# Patient Record
Sex: Male | Born: 1975 | Race: Black or African American | Hispanic: No | Smoking: Current every day smoker
Health system: Southern US, Community
[De-identification: ages and names within clinical notes are randomized; demographics above are authoritative.]

## PROBLEM LIST (undated history)

## (undated) DIAGNOSIS — G629 Polyneuropathy, unspecified: Secondary | ICD-10-CM

## (undated) DIAGNOSIS — D571 Sickle-cell disease without crisis: Secondary | ICD-10-CM

## (undated) DIAGNOSIS — K5090 Crohn's disease, unspecified, without complications: Secondary | ICD-10-CM

## (undated) DIAGNOSIS — F4310 Post-traumatic stress disorder, unspecified: Secondary | ICD-10-CM

## (undated) HISTORY — PX: FOOT SURGERY: SHX648

## (undated) HISTORY — PX: CHOLECYSTECTOMY: SHX55

## (undated) HISTORY — PX: ORCHIECTOMY: SHX2116

---

## 2003-06-08 ENCOUNTER — Emergency Department (HOSPITAL_COMMUNITY): Admission: EM | Admit: 2003-06-08 | Discharge: 2003-06-09 | Payer: Self-pay | Admitting: Emergency Medicine

## 2003-07-18 ENCOUNTER — Emergency Department (HOSPITAL_COMMUNITY): Admission: EM | Admit: 2003-07-18 | Discharge: 2003-07-18 | Payer: Self-pay | Admitting: Emergency Medicine

## 2003-10-21 ENCOUNTER — Encounter: Payer: Self-pay | Admitting: Emergency Medicine

## 2003-10-22 ENCOUNTER — Inpatient Hospital Stay (HOSPITAL_COMMUNITY): Admission: EM | Admit: 2003-10-22 | Discharge: 2003-10-23 | Payer: Self-pay | Admitting: Psychiatry

## 2004-08-11 ENCOUNTER — Observation Stay (HOSPITAL_COMMUNITY): Admission: EM | Admit: 2004-08-11 | Discharge: 2004-08-11 | Payer: Self-pay | Admitting: Emergency Medicine

## 2005-03-12 ENCOUNTER — Other Ambulatory Visit: Payer: Self-pay

## 2005-03-12 ENCOUNTER — Emergency Department: Payer: Self-pay | Admitting: Emergency Medicine

## 2006-01-11 ENCOUNTER — Emergency Department: Payer: Self-pay | Admitting: Emergency Medicine

## 2006-02-06 ENCOUNTER — Emergency Department: Payer: Self-pay | Admitting: Unknown Physician Specialty

## 2006-02-08 ENCOUNTER — Emergency Department: Payer: Self-pay | Admitting: Emergency Medicine

## 2006-02-09 ENCOUNTER — Emergency Department: Payer: Self-pay | Admitting: Emergency Medicine

## 2006-02-10 ENCOUNTER — Other Ambulatory Visit: Payer: Self-pay

## 2006-02-26 ENCOUNTER — Emergency Department (HOSPITAL_COMMUNITY): Admission: EM | Admit: 2006-02-26 | Discharge: 2006-02-26 | Payer: Self-pay | Admitting: Emergency Medicine

## 2006-04-22 ENCOUNTER — Emergency Department (HOSPITAL_COMMUNITY): Admission: EM | Admit: 2006-04-22 | Discharge: 2006-04-23 | Payer: Self-pay | Admitting: Emergency Medicine

## 2006-05-01 ENCOUNTER — Emergency Department (HOSPITAL_COMMUNITY): Admission: EM | Admit: 2006-05-01 | Discharge: 2006-05-02 | Payer: Self-pay | Admitting: Emergency Medicine

## 2006-05-04 ENCOUNTER — Emergency Department (HOSPITAL_COMMUNITY): Admission: EM | Admit: 2006-05-04 | Discharge: 2006-05-05 | Payer: Self-pay | Admitting: Emergency Medicine

## 2006-05-06 ENCOUNTER — Emergency Department (HOSPITAL_COMMUNITY): Admission: EM | Admit: 2006-05-06 | Discharge: 2006-05-07 | Payer: Self-pay | Admitting: Emergency Medicine

## 2006-05-25 ENCOUNTER — Emergency Department (HOSPITAL_COMMUNITY): Admission: EM | Admit: 2006-05-25 | Discharge: 2006-05-25 | Payer: Self-pay | Admitting: Emergency Medicine

## 2006-06-16 ENCOUNTER — Emergency Department (HOSPITAL_COMMUNITY): Admission: EM | Admit: 2006-06-16 | Discharge: 2006-06-16 | Payer: Self-pay | Admitting: Emergency Medicine

## 2006-07-05 ENCOUNTER — Emergency Department (HOSPITAL_COMMUNITY): Admission: EM | Admit: 2006-07-05 | Discharge: 2006-07-05 | Payer: Self-pay | Admitting: Emergency Medicine

## 2006-07-10 ENCOUNTER — Emergency Department (HOSPITAL_COMMUNITY): Admission: EM | Admit: 2006-07-10 | Discharge: 2006-07-11 | Payer: Self-pay | Admitting: Emergency Medicine

## 2006-10-20 ENCOUNTER — Emergency Department: Payer: Self-pay | Admitting: Emergency Medicine

## 2006-10-26 ENCOUNTER — Emergency Department: Payer: Self-pay | Admitting: Emergency Medicine

## 2006-10-26 ENCOUNTER — Other Ambulatory Visit: Payer: Self-pay

## 2007-01-11 ENCOUNTER — Emergency Department (HOSPITAL_COMMUNITY): Admission: EM | Admit: 2007-01-11 | Discharge: 2007-01-12 | Payer: Self-pay | Admitting: Emergency Medicine

## 2007-03-12 ENCOUNTER — Emergency Department (HOSPITAL_COMMUNITY): Admission: EM | Admit: 2007-03-12 | Discharge: 2007-03-12 | Payer: Self-pay | Admitting: Emergency Medicine

## 2007-07-06 ENCOUNTER — Emergency Department: Payer: Self-pay | Admitting: Emergency Medicine

## 2007-08-15 ENCOUNTER — Emergency Department: Payer: Self-pay | Admitting: Emergency Medicine

## 2007-08-15 ENCOUNTER — Other Ambulatory Visit: Payer: Self-pay

## 2007-09-01 ENCOUNTER — Emergency Department: Payer: Self-pay | Admitting: Emergency Medicine

## 2007-12-18 ENCOUNTER — Inpatient Hospital Stay (HOSPITAL_COMMUNITY): Admission: RE | Admit: 2007-12-18 | Discharge: 2007-12-22 | Payer: Self-pay | Admitting: Psychiatry

## 2007-12-18 ENCOUNTER — Emergency Department (HOSPITAL_COMMUNITY): Admission: EM | Admit: 2007-12-18 | Discharge: 2007-12-18 | Payer: Self-pay | Admitting: Emergency Medicine

## 2007-12-18 ENCOUNTER — Ambulatory Visit: Payer: Self-pay | Admitting: Psychiatry

## 2007-12-23 ENCOUNTER — Inpatient Hospital Stay (HOSPITAL_COMMUNITY): Admission: RE | Admit: 2007-12-23 | Discharge: 2007-12-26 | Payer: Self-pay | Admitting: Psychiatry

## 2008-01-06 ENCOUNTER — Emergency Department (HOSPITAL_COMMUNITY): Admission: EM | Admit: 2008-01-06 | Discharge: 2008-01-07 | Payer: Self-pay | Admitting: Emergency Medicine

## 2008-01-07 ENCOUNTER — Ambulatory Visit: Payer: Self-pay | Admitting: Psychiatry

## 2008-01-07 ENCOUNTER — Inpatient Hospital Stay (HOSPITAL_COMMUNITY): Admission: EM | Admit: 2008-01-07 | Discharge: 2008-01-14 | Payer: Self-pay | Admitting: Psychiatry

## 2008-01-08 ENCOUNTER — Emergency Department (HOSPITAL_COMMUNITY): Admission: EM | Admit: 2008-01-08 | Discharge: 2008-01-09 | Payer: Self-pay | Admitting: Emergency Medicine

## 2008-01-24 ENCOUNTER — Inpatient Hospital Stay: Payer: Self-pay | Admitting: Unknown Physician Specialty

## 2008-01-24 ENCOUNTER — Other Ambulatory Visit: Payer: Self-pay

## 2009-02-08 ENCOUNTER — Emergency Department: Payer: Self-pay | Admitting: Emergency Medicine

## 2009-02-09 ENCOUNTER — Emergency Department (HOSPITAL_COMMUNITY): Admission: EM | Admit: 2009-02-09 | Discharge: 2009-02-09 | Payer: Self-pay | Admitting: Emergency Medicine

## 2009-02-11 ENCOUNTER — Emergency Department: Payer: Self-pay | Admitting: Internal Medicine

## 2009-03-05 ENCOUNTER — Emergency Department: Payer: Self-pay | Admitting: Unknown Physician Specialty

## 2009-03-20 ENCOUNTER — Emergency Department: Payer: Self-pay | Admitting: Emergency Medicine

## 2009-03-31 ENCOUNTER — Emergency Department: Payer: Self-pay | Admitting: Emergency Medicine

## 2009-04-01 ENCOUNTER — Emergency Department: Payer: Self-pay | Admitting: Emergency Medicine

## 2009-04-07 ENCOUNTER — Emergency Department: Payer: Self-pay | Admitting: Emergency Medicine

## 2009-04-10 ENCOUNTER — Emergency Department: Payer: Self-pay | Admitting: Emergency Medicine

## 2009-04-14 ENCOUNTER — Emergency Department (HOSPITAL_COMMUNITY): Admission: EM | Admit: 2009-04-14 | Discharge: 2009-04-15 | Payer: Self-pay | Admitting: Emergency Medicine

## 2009-04-28 ENCOUNTER — Emergency Department (HOSPITAL_COMMUNITY): Admission: EM | Admit: 2009-04-28 | Discharge: 2009-04-29 | Payer: Self-pay | Admitting: Emergency Medicine

## 2009-05-05 ENCOUNTER — Emergency Department (HOSPITAL_COMMUNITY): Admission: EM | Admit: 2009-05-05 | Discharge: 2009-05-06 | Payer: Self-pay | Admitting: Emergency Medicine

## 2009-05-08 ENCOUNTER — Emergency Department: Payer: Self-pay | Admitting: Emergency Medicine

## 2009-05-11 ENCOUNTER — Emergency Department (HOSPITAL_COMMUNITY): Admission: EM | Admit: 2009-05-11 | Discharge: 2009-05-11 | Payer: Self-pay | Admitting: Emergency Medicine

## 2009-05-14 ENCOUNTER — Emergency Department: Payer: Self-pay | Admitting: Emergency Medicine

## 2009-05-15 ENCOUNTER — Inpatient Hospital Stay: Payer: Self-pay | Admitting: Internal Medicine

## 2009-05-24 ENCOUNTER — Inpatient Hospital Stay: Payer: Self-pay | Admitting: Internal Medicine

## 2009-06-05 ENCOUNTER — Inpatient Hospital Stay: Payer: Self-pay | Admitting: Internal Medicine

## 2009-06-07 ENCOUNTER — Emergency Department: Payer: Self-pay | Admitting: Emergency Medicine

## 2009-06-13 ENCOUNTER — Emergency Department: Payer: Self-pay | Admitting: Internal Medicine

## 2009-06-16 ENCOUNTER — Ambulatory Visit: Payer: Self-pay | Admitting: Gastroenterology

## 2009-06-17 ENCOUNTER — Inpatient Hospital Stay (HOSPITAL_COMMUNITY): Admission: EM | Admit: 2009-06-17 | Discharge: 2009-06-22 | Payer: Self-pay | Admitting: Emergency Medicine

## 2009-06-27 ENCOUNTER — Emergency Department: Payer: Self-pay | Admitting: Emergency Medicine

## 2009-07-03 ENCOUNTER — Emergency Department: Payer: Self-pay | Admitting: Internal Medicine

## 2009-07-14 ENCOUNTER — Emergency Department: Payer: Self-pay | Admitting: Emergency Medicine

## 2009-07-16 ENCOUNTER — Inpatient Hospital Stay: Payer: Self-pay | Admitting: Psychiatry

## 2009-08-18 ENCOUNTER — Emergency Department: Payer: Self-pay | Admitting: Emergency Medicine

## 2009-09-09 ENCOUNTER — Emergency Department: Payer: Self-pay | Admitting: Internal Medicine

## 2009-09-30 ENCOUNTER — Inpatient Hospital Stay: Payer: Self-pay | Admitting: *Deleted

## 2009-10-27 ENCOUNTER — Emergency Department: Payer: Self-pay | Admitting: Emergency Medicine

## 2009-11-04 ENCOUNTER — Emergency Department: Payer: Self-pay | Admitting: Emergency Medicine

## 2009-11-06 ENCOUNTER — Emergency Department: Payer: Self-pay | Admitting: Emergency Medicine

## 2009-12-16 ENCOUNTER — Inpatient Hospital Stay: Payer: Self-pay | Admitting: Internal Medicine

## 2009-12-26 ENCOUNTER — Emergency Department: Payer: Self-pay | Admitting: Emergency Medicine

## 2009-12-28 ENCOUNTER — Emergency Department: Payer: Self-pay | Admitting: Emergency Medicine

## 2010-02-01 ENCOUNTER — Emergency Department: Payer: Self-pay | Admitting: Emergency Medicine

## 2010-02-15 ENCOUNTER — Inpatient Hospital Stay: Payer: Self-pay | Admitting: Internal Medicine

## 2010-03-15 ENCOUNTER — Emergency Department: Payer: Self-pay | Admitting: Internal Medicine

## 2010-03-27 ENCOUNTER — Inpatient Hospital Stay: Payer: Self-pay | Admitting: Internal Medicine

## 2010-04-14 ENCOUNTER — Emergency Department: Payer: Self-pay | Admitting: Emergency Medicine

## 2010-05-24 ENCOUNTER — Inpatient Hospital Stay: Payer: Self-pay | Admitting: Internal Medicine

## 2010-05-26 IMAGING — CT CT ABD-PELV W/ CM
1 of 3 series · 13 of 32 positions shown, 19 images · non-contrast
Comparison: none

REASON FOR EXAM: (1) rm 2   rt lower quad pain; (2) rm 2  rt lower quad
pain
COMMENTS:

[Series 2: abdomen · axial · 0.77mm/px · z∈[-448,-8]mm · 13 of 102 slices shown, 19 images]
[im 7/102  soft-tissue]
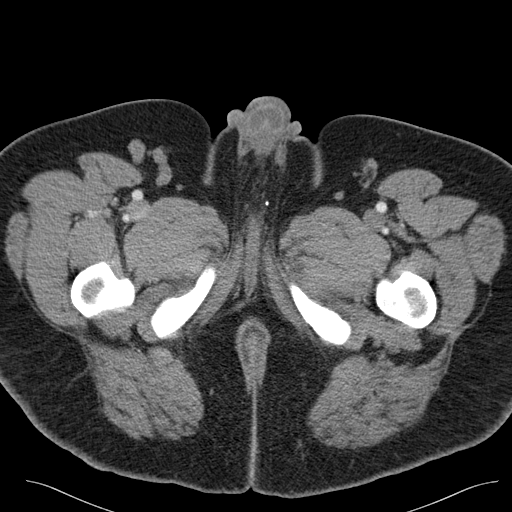
[im 7/102  bone]
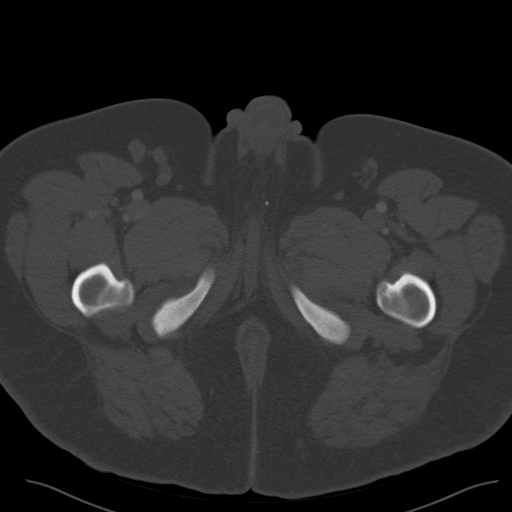
[im 14/102  soft-tissue]
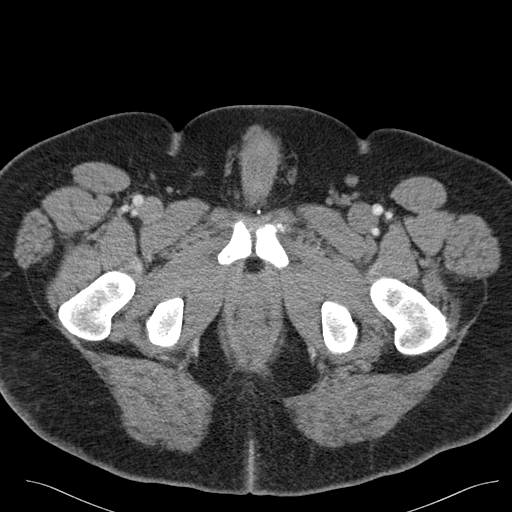
[im 21/102  soft-tissue]
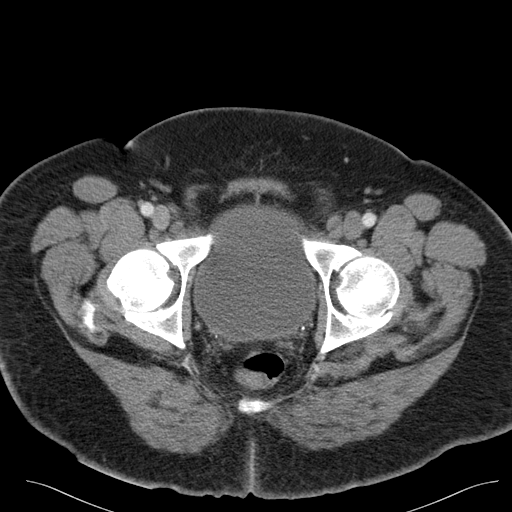
[im 27/102  soft-tissue]
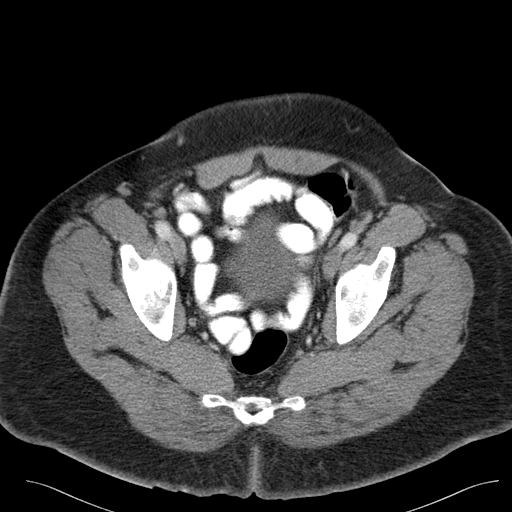
[im 34/102  soft-tissue]
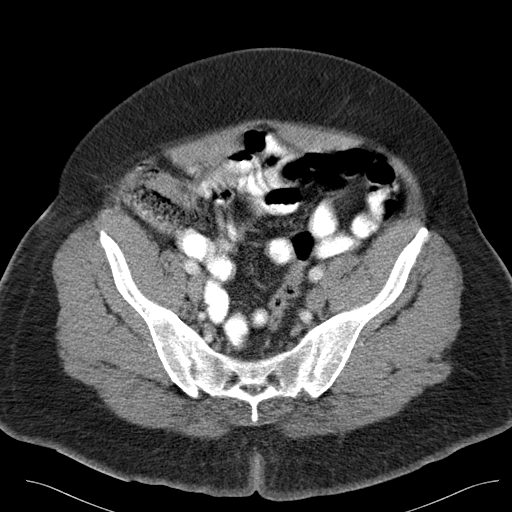
[im 41/102  soft-tissue]
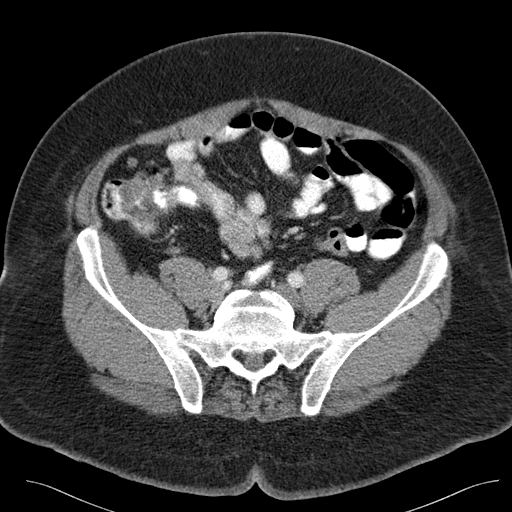
[im 54/102  soft-tissue]
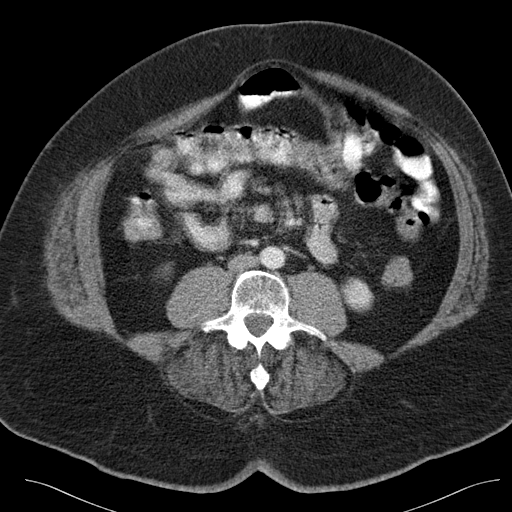
[im 61/102  soft-tissue]
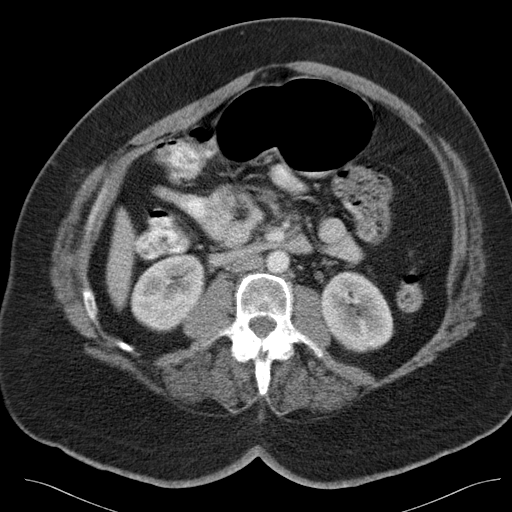
[im 68/102  soft-tissue]
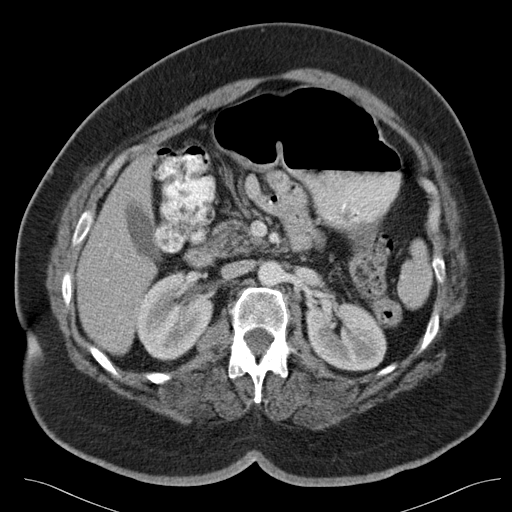
[im 68/102  bone]
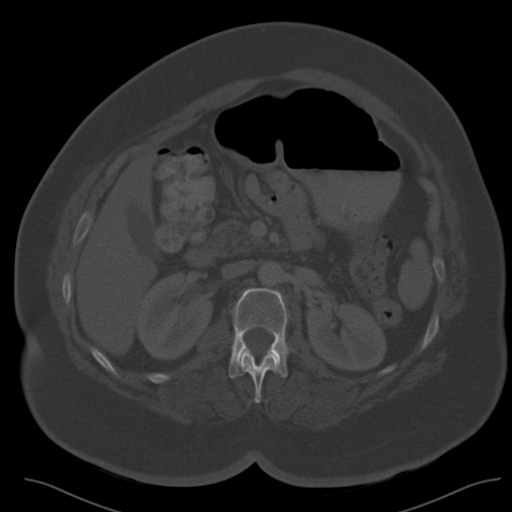
[im 75/102  soft-tissue]
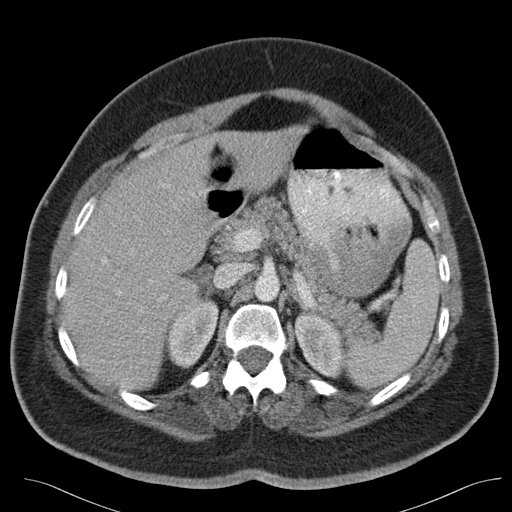
[im 75/102  lung]
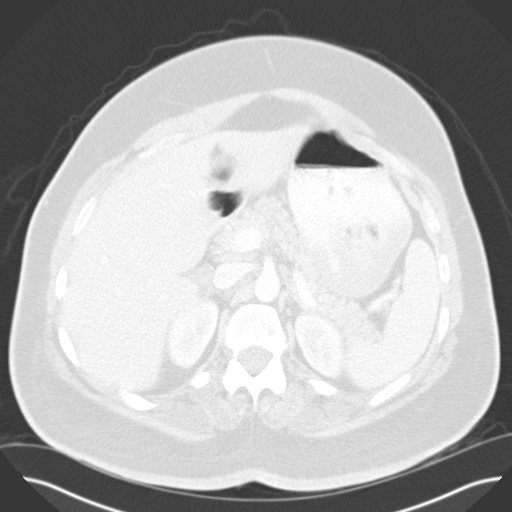
[im 81/102  soft-tissue]
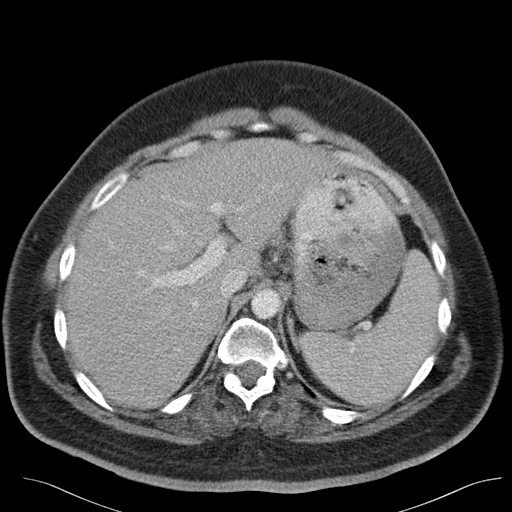
[im 81/102  lung]
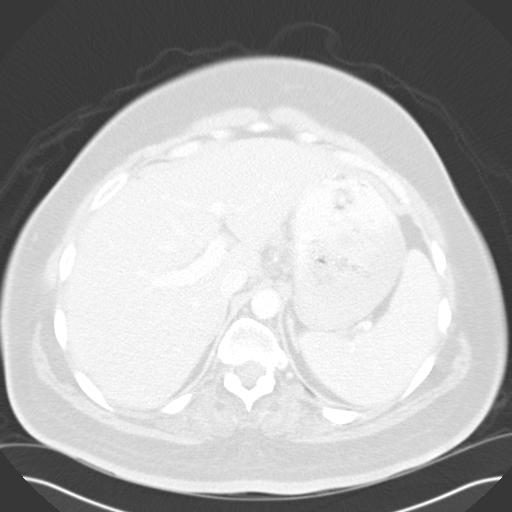
[im 88/102  soft-tissue]
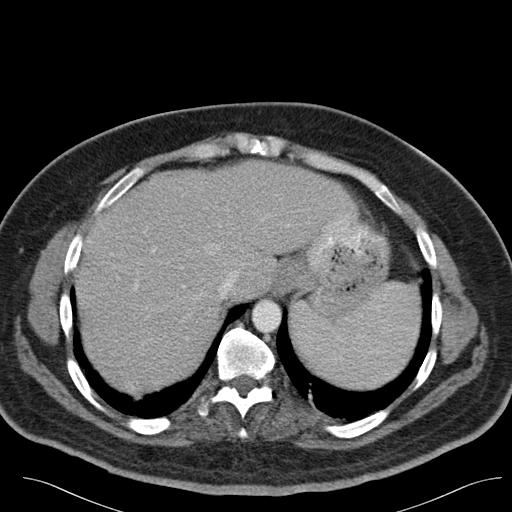
[im 88/102  lung]
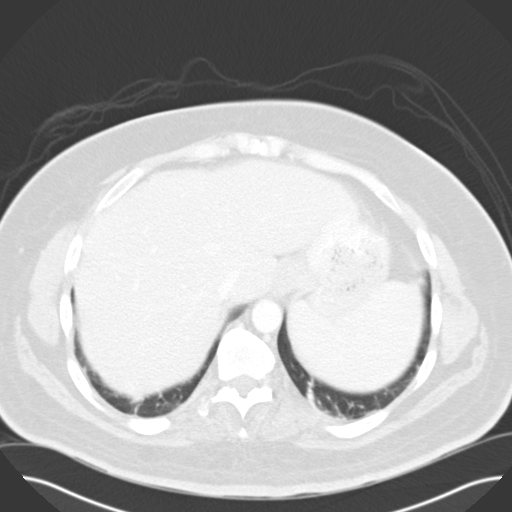
[im 95/102  soft-tissue]
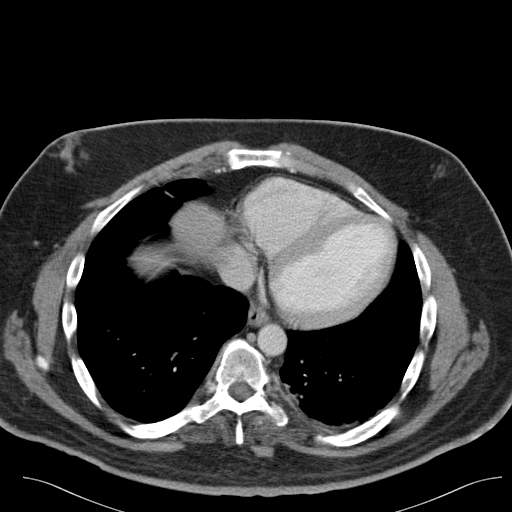
[im 95/102  lung]
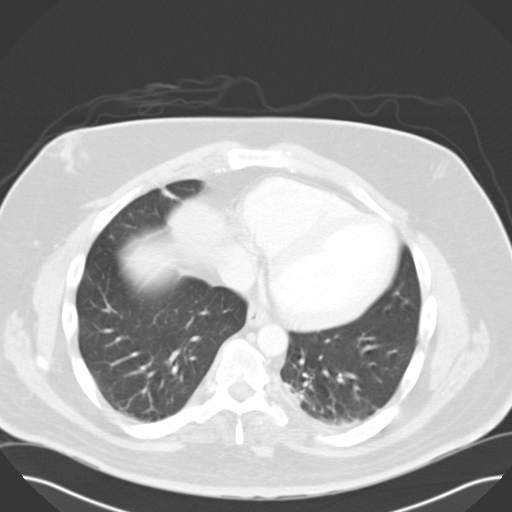

[13 of 32 positions shown; findings below may reference images not displayed]

PROCEDURE:     CT  - CT ABDOMEN / PELVIS  W  - May 09, 2009  [DATE]

RESULT:     Helical 5 mm sections were obtained from the lung bases through
the pubic symphysis.

Evaluation of the lung bases demonstrates no gross abnormalities. This study
was compared to previous study dated 03-20-09.

Helical 5 mm sections were obtained from the lung bases through the pubic
symphysis status post intravenous administration of 100 ml of Isovue 370.

Evaluation of the lung bases demonstrates no gross abnormalities. There is
hypoventilation within the lung bases.

The liver, spleen, adrenals, pancreas and kidneys are unremarkable. There is
no CT evidence reflecting enteritis, colitis, diverticulitis or
appendicitis. There is no CT evidence of an abdominal aortic aneurysm, large
or small bowel obstruction, masses, free fluid nor drainable loculated fluid
collections.
IMPRESSION: 1. No CT evidence of focal or acute intraabdominal or intrapelvic
abnormalities.

Dr. Bolanos of the Emergency Department was informed of these findings via
a preliminary faxed report on 05-09-2009 at [DATE] a.m. Central Standard Time.

## 2010-05-29 ENCOUNTER — Inpatient Hospital Stay (HOSPITAL_COMMUNITY): Admission: EM | Admit: 2010-05-29 | Discharge: 2010-05-31 | Payer: Self-pay | Admitting: Emergency Medicine

## 2010-05-29 ENCOUNTER — Ambulatory Visit: Payer: Self-pay | Admitting: Family Medicine

## 2010-07-11 ENCOUNTER — Emergency Department: Payer: Self-pay | Admitting: Emergency Medicine

## 2010-07-14 IMAGING — CR DG ABDOMEN 3V
1 series · 5 of 5 positions shown · non-contrast
Comparison: none

REASON FOR EXAM: UPRIGHT CHEST TO EVAL FREE AIR; EVAL OBSTRUCTION
COMMENTS:

PROCEDURE:     DXR - DXR ABDOMEN 3-WAY (INCL PA CXR)  - June 27, 2009  [DATE]
RESULT:

[Series 1: view not recorded · 0.17mm/px · 5 of 5 slices shown]
[im 1/5]
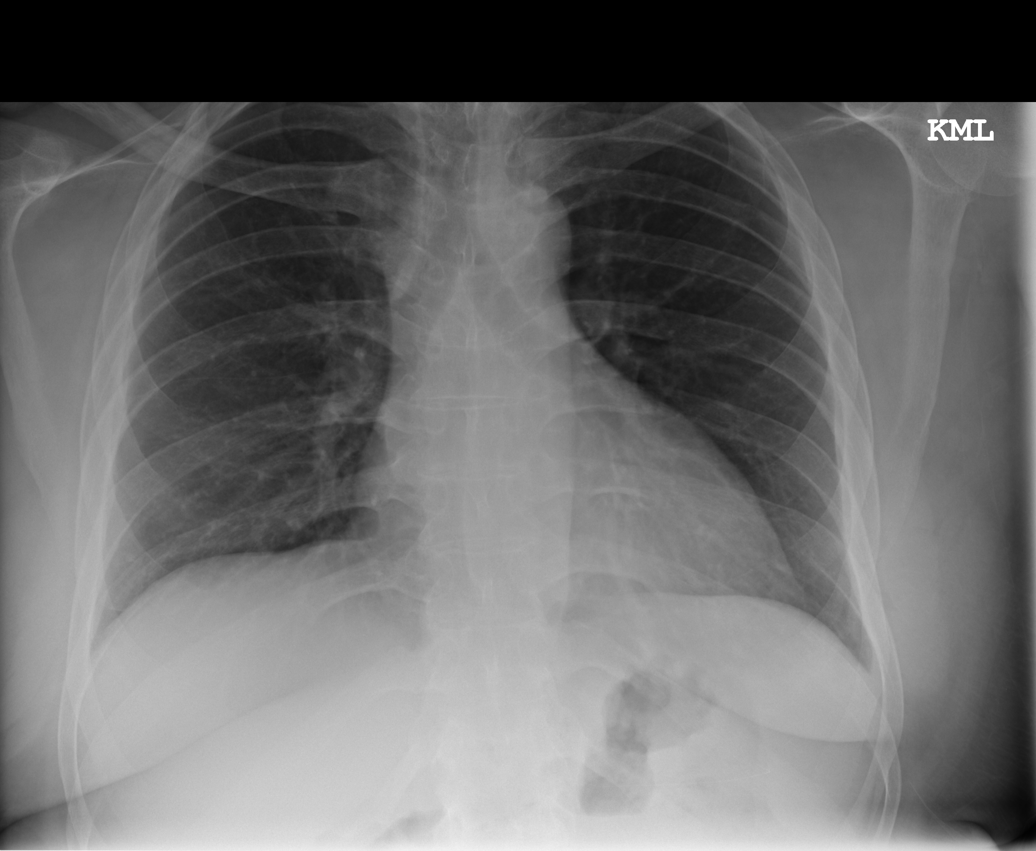
[im 2/5]
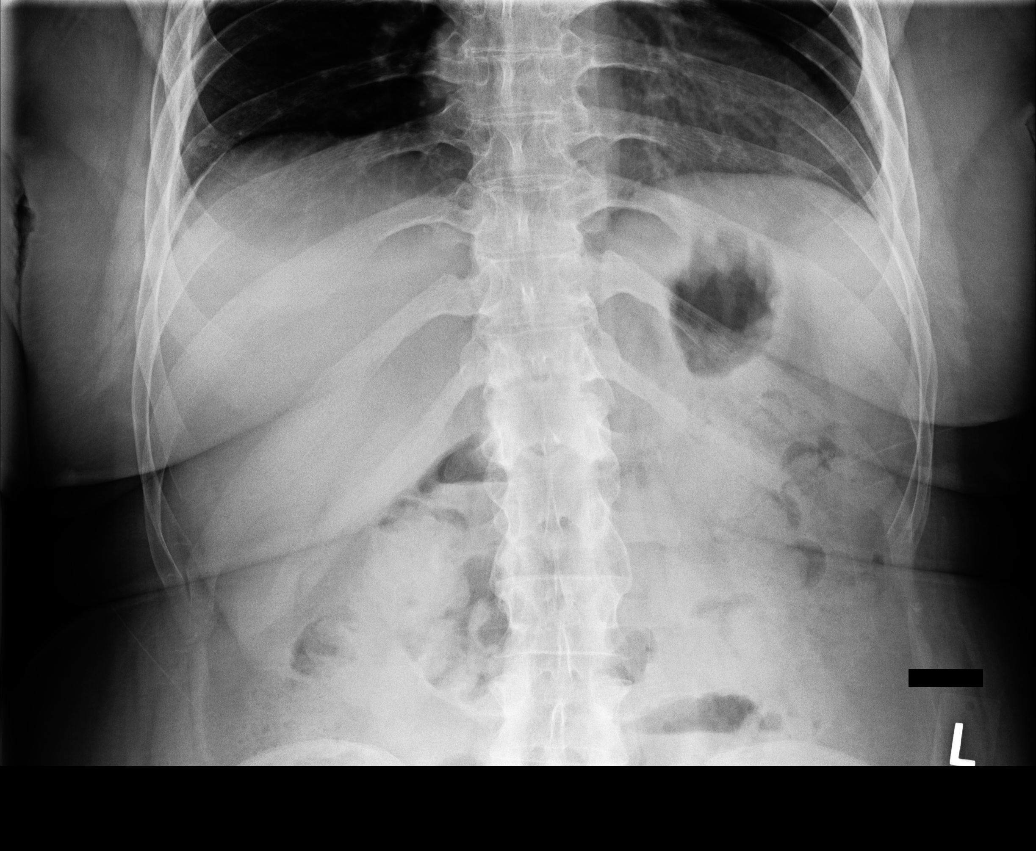
[im 3/5]
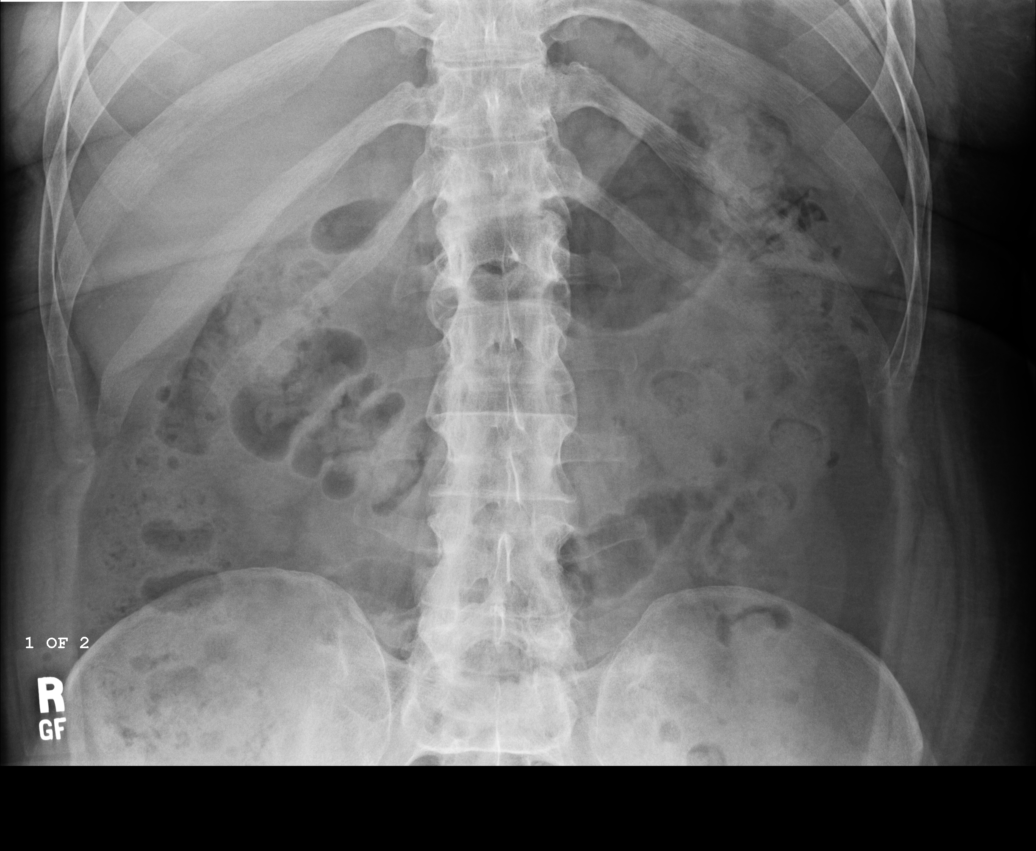
[im 4/5]
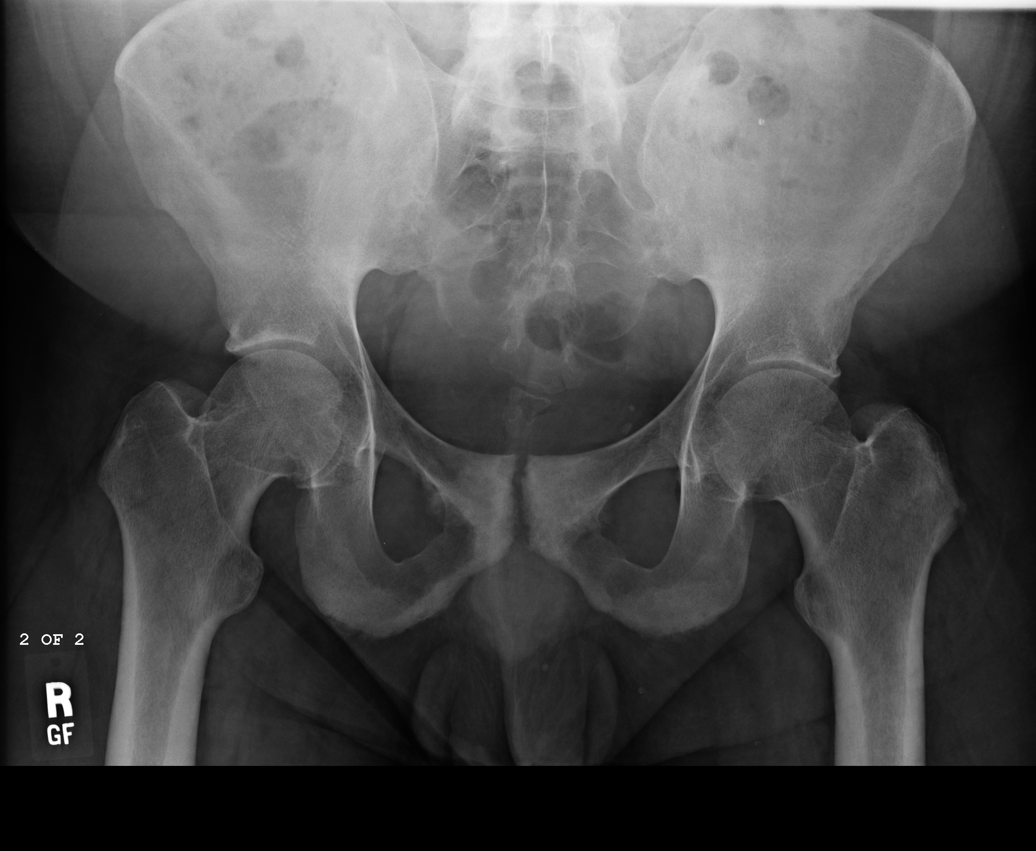
[im 5/5]
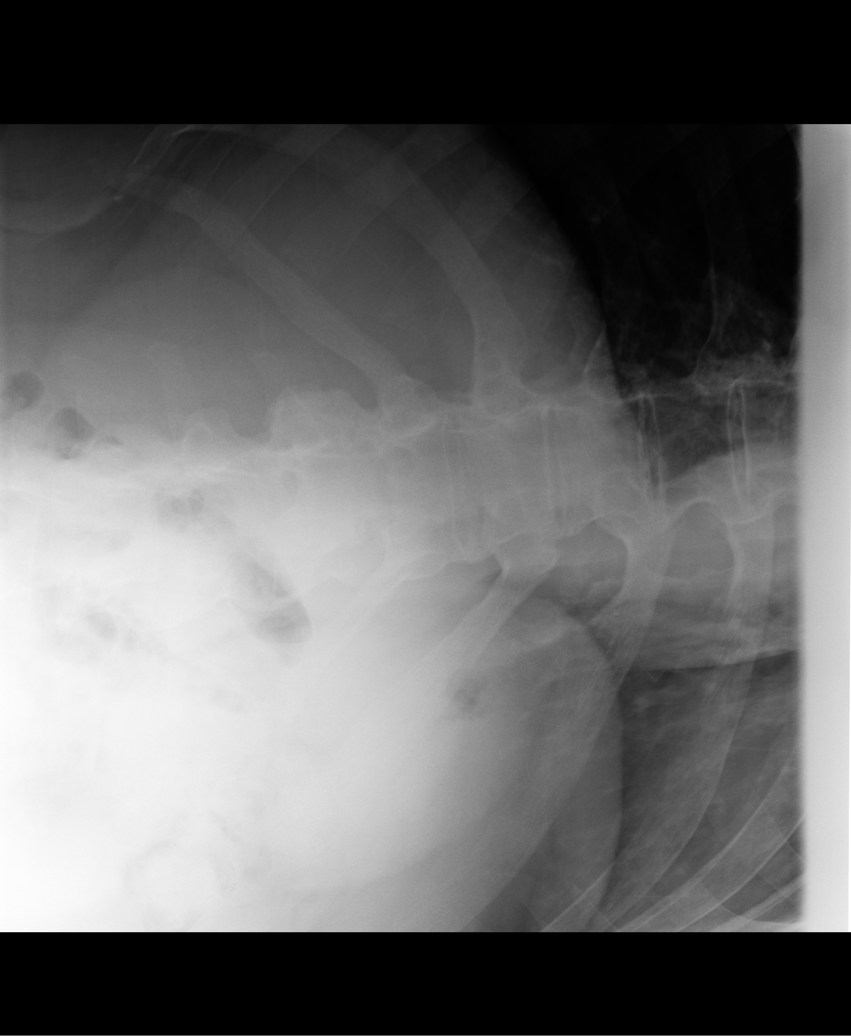

[5 of 5 positions shown; findings below may reference images not displayed]

FINDINGS: Images of the chest show normal aeration.

The erect view the abdomen shows no pleural effusion or free air. There does
not appear to be abnormal gastric or bowel distention. Degenerative changes
are noted in the spine. No definite renal calculi are seen.
IMPRESSION: 1.     No evidence of bowel obstruction or perforation.
2.     No acute cardiopulmonary disease.

## 2010-08-11 ENCOUNTER — Emergency Department: Payer: Self-pay | Admitting: Emergency Medicine

## 2010-08-21 ENCOUNTER — Emergency Department: Payer: Self-pay | Admitting: Emergency Medicine

## 2010-08-29 ENCOUNTER — Inpatient Hospital Stay: Payer: Self-pay | Admitting: Internal Medicine

## 2010-09-22 ENCOUNTER — Emergency Department: Payer: Self-pay | Admitting: Unknown Physician Specialty

## 2010-10-07 ENCOUNTER — Emergency Department: Payer: Self-pay | Admitting: Emergency Medicine

## 2010-10-19 ENCOUNTER — Emergency Department: Payer: Self-pay | Admitting: Unknown Physician Specialty

## 2010-12-22 LAB — BASIC METABOLIC PANEL
BUN: 6 mg/dL (ref 6–23)
CO2: 26 mEq/L (ref 19–32)
CO2: 27 mEq/L (ref 19–32)
Calcium: 8.3 mg/dL — ABNORMAL LOW (ref 8.4–10.5)
Calcium: 8.7 mg/dL (ref 8.4–10.5)
Chloride: 104 mEq/L (ref 96–112)
Chloride: 106 mEq/L (ref 96–112)
Creatinine, Ser: 0.64 mg/dL (ref 0.4–1.5)
GFR calc Af Amer: >60 mL/min (ref >60)
GFR calc Af Amer: >60 mL/min (ref >60)
GFR calc non Af Amer: >60 mL/min (ref >60)
Glucose, Bld: 351 mg/dL — ABNORMAL HIGH (ref 70–99)
Potassium: 4.3 mEq/L (ref 3.5–5.1)
Sodium: 138 mEq/L (ref 135–145)
Sodium: 140 mEq/L (ref 135–145)

## 2010-12-22 LAB — CBC
HCT: 34.8 % — ABNORMAL LOW (ref 39.0–52.0)
HCT: 35.2 % — ABNORMAL LOW (ref 39.0–52.0)
Hemoglobin: 12.1 g/dL — ABNORMAL LOW (ref 13.0–17.0)
Hemoglobin: 12.3 g/dL — ABNORMAL LOW (ref 13.0–17.0)
MCH: 25.9 pg — ABNORMAL LOW (ref 26.0–34.0)
MCH: 27.2 pg (ref 26.0–34.0)
MCHC: 35.3 g/dL (ref 30.0–36.0)
MCV: 75.8 fL — ABNORMAL LOW (ref 78.0–100.0)
MCV: 77 fL — ABNORMAL LOW (ref 78.0–100.0)
Platelets: 101 10*3/uL — ABNORMAL LOW (ref 150–400)
RBC: 4.52 MIL/uL (ref 4.22–5.81)
RBC: 4.56 MIL/uL (ref 4.22–5.81)
RDW: 13.3 % (ref 11.5–15.5)
WBC: 6.8 10*3/uL (ref 4.0–10.5)
WBC: 7.2 10*3/uL (ref 4.0–10.5)

## 2010-12-22 LAB — GLUCOSE, CAPILLARY
Glucose-Capillary: 129 mg/dL — ABNORMAL HIGH (ref 70–99)
Glucose-Capillary: 149 mg/dL — ABNORMAL HIGH (ref 70–99)
Glucose-Capillary: 157 mg/dL — ABNORMAL HIGH (ref 70–99)
Glucose-Capillary: 163 mg/dL — ABNORMAL HIGH (ref 70–99)
Glucose-Capillary: 194 mg/dL — ABNORMAL HIGH (ref 70–99)
Glucose-Capillary: 196 mg/dL — ABNORMAL HIGH (ref 70–99)
Glucose-Capillary: 210 mg/dL — ABNORMAL HIGH (ref 70–99)
Glucose-Capillary: 218 mg/dL — ABNORMAL HIGH (ref 70–99)
Glucose-Capillary: 286 mg/dL — ABNORMAL HIGH (ref 70–99)
Glucose-Capillary: 289 mg/dL — ABNORMAL HIGH (ref 70–99)
Glucose-Capillary: 294 mg/dL — ABNORMAL HIGH (ref 70–99)
Glucose-Capillary: 294 mg/dL — ABNORMAL HIGH (ref 70–99)
Glucose-Capillary: 330 mg/dL — ABNORMAL HIGH (ref 70–99)
Glucose-Capillary: 90 mg/dL (ref 70–99)

## 2010-12-22 LAB — URINALYSIS, ROUTINE W REFLEX MICROSCOPIC
Glucose, UA: >1000 mg/dL — AB
Leukocytes, UA: NEGATIVE
Protein, ur: NEGATIVE mg/dL
Specific Gravity, Urine: 1.023 (ref 1.005–1.030)
pH: 6 (ref 5.0–8.0)

## 2010-12-22 LAB — OPIATE, QUANTITATIVE, URINE
Codeine Urine: NEGATIVE NG/ML
Hydromorphone GC/MS Conf: NEGATIVE NG/ML
Morphine, Confirm: 1994 NG/ML — ABNORMAL HIGH
Oxycodone, ur: NEGATIVE NG/ML
Oxymorphone: NEGATIVE NG/ML

## 2010-12-22 LAB — MRSA PCR SCREENING: MRSA by PCR: POSITIVE — AB

## 2010-12-22 LAB — DIFFERENTIAL
Basophils Relative: 0 % (ref 0–1)
Eosinophils Relative: 2 % (ref 0–5)
Lymphs Abs: 1.9 10*3/uL (ref 0.7–4.0)
Monocytes Absolute: 0.8 10*3/uL (ref 0.1–1.0)
Neutro Abs: 4.7 10*3/uL (ref 1.7–7.7)

## 2010-12-22 LAB — THC (MARIJUANA), URINE, CONFIRMATION: Marijuana, Ur-Confirmation: 106 NG/ML — ABNORMAL HIGH

## 2010-12-22 LAB — COMPREHENSIVE METABOLIC PANEL
Albumin: 2.6 g/dL — ABNORMAL LOW (ref 3.5–5.2)
BUN: 8 mg/dL (ref 6–23)
Creatinine, Ser: 0.65 mg/dL (ref 0.4–1.5)
Total Protein: 5.4 g/dL — ABNORMAL LOW (ref 6.0–8.3)

## 2010-12-22 LAB — LIPID PANEL
Cholesterol: 155 mg/dL (ref 0–200)
HDL: 31 mg/dL — ABNORMAL LOW (ref >39)
HDL: 31 mg/dL — ABNORMAL LOW (ref >39)
LDL Cholesterol: 109 mg/dL — ABNORMAL HIGH (ref 0–99)
Total CHOL/HDL Ratio: 5.1 RATIO
Triglycerides: 74 mg/dL (ref <150)

## 2010-12-22 LAB — DRUGS OF ABUSE SCREEN W/O ALC, ROUTINE URINE
Amphetamine Screen, Ur: NEGATIVE
Benzodiazepines.: NEGATIVE
Creatinine,U: 45.7 mg/dL
Methadone: NEGATIVE
Phencyclidine (PCP): NEGATIVE
Propoxyphene: NEGATIVE

## 2010-12-22 LAB — URINE MICROSCOPIC-ADD ON

## 2010-12-22 LAB — HEMOGLOBIN A1C: Mean Plasma Glucose: 355 mg/dL — ABNORMAL HIGH (ref <117)

## 2011-01-13 LAB — BASIC METABOLIC PANEL
BUN: 10 mg/dL (ref 6–23)
BUN: 7 mg/dL (ref 6–23)
CO2: 29 mEq/L (ref 19–32)
CO2: 29 mEq/L (ref 19–32)
CO2: 30 mEq/L (ref 19–32)
CO2: 31 mEq/L (ref 19–32)
Calcium: 8.5 mg/dL (ref 8.4–10.5)
Calcium: 8.6 mg/dL (ref 8.4–10.5)
Calcium: 8.6 mg/dL (ref 8.4–10.5)
Calcium: 9 mg/dL (ref 8.4–10.5)
Chloride: 101 mEq/L (ref 96–112)
Chloride: 102 mEq/L (ref 96–112)
Chloride: 103 mEq/L (ref 96–112)
Chloride: 107 mEq/L (ref 96–112)
Creatinine, Ser: 0.85 mg/dL (ref 0.4–1.5)
GFR calc Af Amer: >60 mL/min (ref >60)
GFR calc Af Amer: >60 mL/min (ref >60)
GFR calc Af Amer: >60 mL/min (ref >60)
GFR calc Af Amer: >60 mL/min (ref >60)
GFR calc Af Amer: >60 mL/min (ref >60)
GFR calc non Af Amer: >60 mL/min (ref >60)
GFR calc non Af Amer: >60 mL/min (ref >60)
Glucose, Bld: 223 mg/dL — ABNORMAL HIGH (ref 70–99)
Potassium: 3 mEq/L — ABNORMAL LOW (ref 3.5–5.1)
Potassium: 3 mEq/L — ABNORMAL LOW (ref 3.5–5.1)
Sodium: 134 mEq/L — ABNORMAL LOW (ref 135–145)
Sodium: 137 mEq/L (ref 135–145)
Sodium: 140 mEq/L (ref 135–145)
Sodium: 144 mEq/L (ref 135–145)

## 2011-01-13 LAB — CBC
HCT: 34.4 % — ABNORMAL LOW (ref 39.0–52.0)
Hemoglobin: 11.6 g/dL — ABNORMAL LOW (ref 13.0–17.0)
Hemoglobin: 12.4 g/dL — ABNORMAL LOW (ref 13.0–17.0)
MCHC: 32.8 g/dL (ref 30.0–36.0)
MCHC: 33.3 g/dL (ref 30.0–36.0)
MCHC: 33.3 g/dL (ref 30.0–36.0)
MCV: 79.4 fL (ref 78.0–100.0)
MCV: 80.2 fL (ref 78.0–100.0)
Platelets: 101 10*3/uL — ABNORMAL LOW (ref 150–400)
Platelets: 88 10*3/uL — ABNORMAL LOW (ref 150–400)
Platelets: 97 10*3/uL — ABNORMAL LOW (ref 150–400)
RBC: 4.38 MIL/uL (ref 4.22–5.81)
RBC: 4.53 MIL/uL (ref 4.22–5.81)
RBC: 4.67 MIL/uL (ref 4.22–5.81)
RDW: 16.4 % — ABNORMAL HIGH (ref 11.5–15.5)
RDW: 16.5 % — ABNORMAL HIGH (ref 11.5–15.5)
RDW: 16.9 % — ABNORMAL HIGH (ref 11.5–15.5)
WBC: 13.4 10*3/uL — ABNORMAL HIGH (ref 4.0–10.5)

## 2011-01-13 LAB — HEPATIC FUNCTION PANEL
ALT: 37 U/L (ref 0–53)
AST: 54 U/L — ABNORMAL HIGH (ref 0–37)
Bilirubin, Direct: <0.1 mg/dL (ref 0.0–0.3)
Total Protein: 5.6 g/dL — ABNORMAL LOW (ref 6.0–8.3)

## 2011-01-13 LAB — POCT I-STAT 3, VENOUS BLOOD GAS (G3P V)
Acid-Base Excess: 1 mmol/L (ref 0.0–2.0)
Bicarbonate: 26.1 mEq/L — ABNORMAL HIGH (ref 20.0–24.0)
TCO2: 27 mmol/L (ref 0–100)
pH, Ven: 7.396 — ABNORMAL HIGH (ref 7.250–7.300)
pO2, Ven: 35 mmHg (ref 30.0–45.0)

## 2011-01-13 LAB — GLUCOSE, CAPILLARY
Glucose-Capillary: 153 mg/dL — ABNORMAL HIGH (ref 70–99)
Glucose-Capillary: 162 mg/dL — ABNORMAL HIGH (ref 70–99)
Glucose-Capillary: 185 mg/dL — ABNORMAL HIGH (ref 70–99)
Glucose-Capillary: 203 mg/dL — ABNORMAL HIGH (ref 70–99)
Glucose-Capillary: 208 mg/dL — ABNORMAL HIGH (ref 70–99)
Glucose-Capillary: 284 mg/dL — ABNORMAL HIGH (ref 70–99)
Glucose-Capillary: 300 mg/dL — ABNORMAL HIGH (ref 70–99)
Glucose-Capillary: 320 mg/dL — ABNORMAL HIGH (ref 70–99)
Glucose-Capillary: 334 mg/dL — ABNORMAL HIGH (ref 70–99)
Glucose-Capillary: 371 mg/dL — ABNORMAL HIGH (ref 70–99)
Glucose-Capillary: 435 mg/dL — ABNORMAL HIGH (ref 70–99)
Glucose-Capillary: >600 mg/dL (ref 70–99)

## 2011-01-13 LAB — CULTURE, BLOOD (ROUTINE X 2): Culture: NO GROWTH

## 2011-01-13 LAB — DIFFERENTIAL
Basophils Absolute: 0 10*3/uL (ref 0.0–0.1)
Basophils Relative: 0 % (ref 0–1)
Lymphocytes Relative: 11 % — ABNORMAL LOW (ref 12–46)
Neutro Abs: 9.1 10*3/uL — ABNORMAL HIGH (ref 1.7–7.7)
Neutrophils Relative %: 83 % — ABNORMAL HIGH (ref 43–77)

## 2011-01-13 LAB — LACTIC ACID, PLASMA: Lactic Acid, Venous: 2 mmol/L (ref 0.5–2.2)

## 2011-01-13 LAB — RETICULOCYTES
RBC.: 4.52 MIL/uL (ref 4.22–5.81)
Retic Count, Absolute: 81.4 10*3/uL (ref 19.0–186.0)

## 2011-01-13 LAB — KETONES, QUALITATIVE: Acetone, Bld: NEGATIVE

## 2011-01-13 LAB — URINALYSIS, ROUTINE W REFLEX MICROSCOPIC
Glucose, UA: >1000 mg/dL — AB
Leukocytes, UA: NEGATIVE
Protein, ur: NEGATIVE mg/dL
Urobilinogen, UA: 0.2 mg/dL (ref 0.0–1.0)

## 2011-01-13 LAB — FERRITIN: Ferritin: 244 ng/mL (ref 22–322)

## 2011-01-13 LAB — IRON AND TIBC
Iron: 49 ug/dL (ref 42–135)
Saturation Ratios: 25 % (ref 20–55)
UIBC: 151 ug/dL

## 2011-01-13 LAB — RAPID URINE DRUG SCREEN, HOSP PERFORMED
Amphetamines: NOT DETECTED
Benzodiazepines: NOT DETECTED

## 2011-01-13 LAB — URINE MICROSCOPIC-ADD ON

## 2011-01-13 LAB — VITAMIN B12: Vitamin B-12: 335 pg/mL (ref 211–911)

## 2011-01-14 LAB — GLUCOSE, CAPILLARY: Glucose-Capillary: 374 mg/dL — ABNORMAL HIGH (ref 70–99)

## 2011-01-15 LAB — COMPREHENSIVE METABOLIC PANEL
ALT: 18 U/L (ref 0–53)
ALT: 22 U/L (ref 0–53)
AST: 21 U/L (ref 0–37)
AST: 24 U/L (ref 0–37)
Albumin: 3.7 g/dL (ref 3.5–5.2)
CO2: 28 mEq/L (ref 19–32)
Calcium: 8.9 mg/dL (ref 8.4–10.5)
Calcium: 9.7 mg/dL (ref 8.4–10.5)
Creatinine, Ser: 0.94 mg/dL (ref 0.4–1.5)
Creatinine, Ser: 1.15 mg/dL (ref 0.4–1.5)
GFR calc Af Amer: >60 mL/min (ref >60)
GFR calc Af Amer: >60 mL/min (ref >60)
GFR calc non Af Amer: >60 mL/min (ref >60)
Sodium: 133 mEq/L — ABNORMAL LOW (ref 135–145)
Sodium: 136 mEq/L (ref 135–145)
Total Protein: 7.4 g/dL (ref 6.0–8.3)
Total Protein: 7.6 g/dL (ref 6.0–8.3)

## 2011-01-15 LAB — GLUCOSE, CAPILLARY
Glucose-Capillary: 370 mg/dL — ABNORMAL HIGH (ref 70–99)
Glucose-Capillary: 419 mg/dL — ABNORMAL HIGH (ref 70–99)
Glucose-Capillary: 533 mg/dL (ref 70–99)
Glucose-Capillary: >600 mg/dL (ref 70–99)

## 2011-01-15 LAB — DIFFERENTIAL
Basophils Relative: 1 % (ref 0–1)
Eosinophils Absolute: 0.1 10*3/uL (ref 0.0–0.7)
Eosinophils Absolute: 0.2 10*3/uL (ref 0.0–0.7)
Eosinophils Absolute: 0.2 10*3/uL (ref 0.0–0.7)
Eosinophils Relative: 2 % (ref 0–5)
Eosinophils Relative: 3 % (ref 0–5)
Lymphocytes Relative: 16 % (ref 12–46)
Lymphocytes Relative: 26 % (ref 12–46)
Lymphs Abs: 1.4 10*3/uL (ref 0.7–4.0)
Lymphs Abs: 2.3 10*3/uL (ref 0.7–4.0)
Monocytes Absolute: 0.7 10*3/uL (ref 0.1–1.0)
Monocytes Absolute: 0.7 10*3/uL (ref 0.1–1.0)
Monocytes Relative: 10 % (ref 3–12)
Monocytes Relative: 8 % (ref 3–12)
Monocytes Relative: 8 % (ref 3–12)
Neutrophils Relative %: 71 % (ref 43–77)
Neutrophils Relative %: 63 % (ref 43–77)

## 2011-01-15 LAB — URINALYSIS, ROUTINE W REFLEX MICROSCOPIC
Bilirubin Urine: NEGATIVE
Bilirubin Urine: NEGATIVE
Glucose, UA: >1000 mg/dL — AB
Hgb urine dipstick: NEGATIVE
Hgb urine dipstick: NEGATIVE
Ketones, ur: NEGATIVE mg/dL
Leukocytes, UA: NEGATIVE
Nitrite: NEGATIVE
Protein, ur: NEGATIVE mg/dL
Protein, ur: NEGATIVE mg/dL
Specific Gravity, Urine: 1.022 (ref 1.005–1.030)
Urobilinogen, UA: 0.2 mg/dL (ref 0.0–1.0)
pH: 5.5 (ref 5.0–8.0)

## 2011-01-15 LAB — CBC
Hemoglobin: 10.8 g/dL — ABNORMAL LOW (ref 13.0–17.0)
MCHC: 32.4 g/dL (ref 30.0–36.0)
MCHC: 33.4 g/dL (ref 30.0–36.0)
MCHC: 33 g/dL (ref 30.0–36.0)
MCV: 76.8 fL — ABNORMAL LOW (ref 78.0–100.0)
MCV: 77.4 fL — ABNORMAL LOW (ref 78.0–100.0)
MCV: 76.5 fL — ABNORMAL LOW (ref 78.0–100.0)
Platelets: 153 10*3/uL (ref 150–400)
RBC: 4.62 MIL/uL (ref 4.22–5.81)
RBC: 4.27 MIL/uL (ref 4.22–5.81)
RDW: 15.4 % (ref 11.5–15.5)
RDW: 15.6 % — ABNORMAL HIGH (ref 11.5–15.5)

## 2011-01-15 LAB — BASIC METABOLIC PANEL
CO2: 27 mEq/L (ref 19–32)
Chloride: 103 mEq/L (ref 96–112)
Creatinine, Ser: 0.92 mg/dL (ref 0.4–1.5)
GFR calc Af Amer: >60 mL/min (ref >60)
Glucose, Bld: 282 mg/dL — ABNORMAL HIGH (ref 70–99)

## 2011-01-15 LAB — URINE CULTURE

## 2011-01-15 LAB — POCT I-STAT 3, VENOUS BLOOD GAS (G3P V)
Acid-Base Excess: 1 mmol/L (ref 0.0–2.0)
Bicarbonate: 26.4 mEq/L — ABNORMAL HIGH (ref 20.0–24.0)
Patient temperature: 97.8
TCO2: 28 mmol/L (ref 0–100)
pO2, Ven: 55 mmHg — ABNORMAL HIGH (ref 30.0–45.0)

## 2011-01-15 LAB — URINE MICROSCOPIC-ADD ON

## 2011-01-15 LAB — LIPASE, BLOOD
Lipase: 54 U/L (ref 11–59)
Lipase: 55 U/L (ref 11–59)

## 2011-01-15 LAB — LACTIC ACID, PLASMA: Lactic Acid, Venous: 4 mmol/L — ABNORMAL HIGH (ref 0.5–2.2)

## 2011-01-17 LAB — URINE CULTURE: Culture: NO GROWTH

## 2011-01-17 LAB — CBC
MCHC: 32.8 g/dL (ref 30.0–36.0)
MCV: 77.6 fL — ABNORMAL LOW (ref 78.0–100.0)
Platelets: 191 10*3/uL (ref 150–400)

## 2011-01-17 LAB — URINALYSIS, ROUTINE W REFLEX MICROSCOPIC
Hgb urine dipstick: NEGATIVE
Protein, ur: 30 mg/dL — AB
Urobilinogen, UA: 1 mg/dL (ref 0.0–1.0)

## 2011-01-17 LAB — DIFFERENTIAL
Basophils Relative: 1 % (ref 0–1)
Eosinophils Absolute: 0.3 10*3/uL (ref 0.0–0.7)
Neutrophils Relative %: 64 % (ref 43–77)

## 2011-01-17 LAB — URINE MICROSCOPIC-ADD ON

## 2011-01-17 LAB — GC/CHLAMYDIA PROBE AMP, GENITAL: GC Probe Amp, Genital: NEGATIVE

## 2011-01-24 ENCOUNTER — Emergency Department: Payer: Self-pay | Admitting: Unknown Physician Specialty

## 2011-02-01 ENCOUNTER — Inpatient Hospital Stay: Payer: Self-pay | Admitting: Internal Medicine

## 2011-02-21 NOTE — Discharge Summary (Signed)
NAMEJOHNTAVIOUS, FRANCOM              ACCOUNT NO.:  [id number]   MEDICAL RECORD NO.:  [id number]          PATIENT TYPE:  INP   LOCATION:  1505                         FACILITY:  [facility]   PHYSICIAN:  Mobolaji B. Bakare, M.D.DATE OF BIRTH:  08-Aug-1976   DATE OF ADMISSION:  12/22/2007  DATE OF DISCHARGE:  12/26/2007                               DISCHARGE SUMMARY   PRIMARY CARE PHYSICIAN:  Gentry Fitz (The patient attends health  department in [place]).   FINAL DIAGNOSES:  1. Acute diarrheal illness.  2. Major depression with suicidal ideation.  3. New onset diabetes mellitus.  4. 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   CONSULTATIONS:  Psychiatric consultation provided by Dr. Anselm Jungling.   PROCEDURES:  1. Abdominal CT scan showed fluid-filled loops of small and large      bowel which correlates with a history of diarrheal illness.  No      evidence of bowel obstruction or abscess.  2. CT scan of the pelvis showed no acute abnormality, suspect      ____________ .   BRIEF HISTORY:  Mr. Pavek is a 35 year old African American male who  was initially being treated at [facility] for major depression  and suicidal ideation.  The patient was noted to have developed diarrhea  associated with nausea.  This was copious, multiple episodes associated  with severe nausea.  There was no fever or chills.  The patient was sent  to [facility] for further evaluation and treatment.  Initial vital signs showed a temperature of 97.6, blood pressure 127/75.  Physical examination was remarkable for dehydration.  Abdominal  examination also showed his tenderness.  The patient had a CT scan of  the abdomen and result is as noted above.  He was empirically started on  Flagyl and ciprofloxacin given the severity of the diarrheal disease.  White cell count was slightly elevated at 10.9.   HOSPITAL COURSE:  1. Diarrheal  disease.  This resolved with conservative management.  He      was also on antibiotics.  At the time of discharge, the diarrhea      has subsided.  The patient is tolerating diabetic diet.  No more      nausea or vomiting.  He will blood discharged home for 3 more days      of p.o. Flagyl and ciprofloxacin.  2. New onset diabetes mellitus.  The patient was noted on initial      admission to have blood glucose of 417.  Urinalysis also showed      glycosuria, greater than 1000.  The patient was started on Lantus      insulin at a low dose of 5 units and he was also on sliding scale      insulin.  He did not require a lot of insulin.  It should be      mentioned that the patient was initially started on insulin      infusion on admission.  At the time of discharge, blood glucose was  ranging around 100 fasting sugar in the morning.  He will be      transitioned to Metformin 500 mg a half tablet daily.  A home      health nurse will be arranged to follow up with the patient to      monitor and ensure adequate monitoring of his blood glucose.  His      hemoglobin A1c was 6.2 which is rather low considering the high      level of glucose and glycosuria at the time of admission.  It is      questioned if the Zyprexa could have precipitated this      hyperglycemic episode.  Zyprexa has been discontinued by Dr.      Electa Sniff given that the patient does not need this medication at      this point.  I suspect Metformin may be discontinued fairly soon.      I have instructed him not to use Metformin if his blood glucose is      less than 100, and he should check his blood glucose every morning      before breakfast.  Home health RN will follow up with the patient.  3. Major depression.  He was seen by Dr. Electa Sniff with a helpful      disposition.  He opined the patient does not require a 24-hour      sitter.  He discontinued Zyprexa and started on Zoloft trial.  The      patient is agreeable to  this.  He recommended outpatient mental      health followup in [facility] where the patient resides.  4. History of bilateral orchiectomy.  The patient was on an AndroGel      patch prior to hospitalization.  He stated that he has not been      able to obtain this medication because of the expense.  It was      prescribed by the Health Department in [place].  He will be      given Depo-Testosterone 100 mg IM until he resumes AndroGel and I      have asked him to follow up with the Health Department in [facility] regarding options available to him.   DISCHARGE MEDICATIONS:  1. Glucophage 500 mg half tablet daily.  2. Flagyl 500 mg t.i.d. for 3 days.  3. Ciprofloxacin 500 mg b.i.d. for 3 days.  4. Zoloft 500 mg daily.  5. AndroGel patch to start in the next 2-3 weeks.   DISCHARGE INSTRUCTIONS:  1. The patient is to check his blood glucose daily before breakfast      and show the reading to his doctor at the health department.  2. He should not use Metformin if blood glucose is less than 100.   FOLLOWUP:  1. Health Department in [place] in 1-2 weeks.  2. Follow up with outpatient mental health clinic.      Mobolaji B. Corky Downs, M.D.  Electronically Signed     MBB/MEDQ  D:  12/26/2007  T:  12/26/2007  Job:  [id number]   cc:   Anselm Jungling, MD

## 2011-02-21 NOTE — H&P (Signed)
Roberto, [name]              ACCOUNT NO.:  [id number]   MEDICAL RECORD NO.:  [id number]          PATIENT TYPE:  INP   LOCATION:  1303                         FACILITY:  [facility]   PHYSICIAN:  Darryl D. Prime, MD    DATE OF BIRTH:  1975-10-26   DATE OF ADMISSION:  12/22/2007  DATE OF DISCHARGE:                              HISTORY & PHYSICAL   CODE STATUS:  Full code.   PRIMARY CARE PHYSICIAN:  None.   TOTAL VISIT TIME:  Approximately one hour.   CHIEF COMPLAINT:  He has bee having diarrhea and nausea.   HISTORY OF PRESENT ILLNESS:  Roberto [name] is a 35 year old male with  history of depression and polysubstance abuse, admitted to [facility] December 19, 2007, for detox, depression and possible suicidal  ideation.  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  The patient apparently has developed  over the last four days severe diarrhea and notes as much as 20-30 a  day, nonbloody with no mucous and associated severe nausea.  The patient  denies eating anything unusual.  He denies any fever, but does note a  generalized headache and polyuria/polydipsia.  The patient notes his  p.o. intake is good.  The patient apparently was started on Zyprexa on  admission.  He has also been getting Librium as needed.  The patient was  also getting testosterone.  In the emergency room, the patient was found  to have a blood sugar of 417.  In the emergency room, he was given 10  units of IV insulin and morphine.  He was also given 4 mg of IV x2 as  well as Phenergan.   PAST MEDICAL/SURGICAL HISTORY:  1. Sickle cell trait.  2. History of tobacco abuse.  3. History of cocaine abuse.  4. History of alcohol abuse, heavy.  5. History of depression.  6. History of chronic back pain.  7. Status post orchiectomy.  8. Status post Cannabis use.   ALLERGIES:  No known drug allergies.   MEDICATIONS:  He was on none at home.   MEDICATIONS ON TRANSPORTATION:  1. Zyprexa 5 mg p.o. q.6 h p.r.n.  2. Testosterone  daily.  3. Trazodone 100 mg p.o. q.h.s.  4. Librium 25 mg q.6 h p.r.n.   SOCIAL HISTORY:  History of polysubstance abuse as above.  He is  homeless.   FAMILY HISTORY:  Positive for father who passed away of cancer.  Mother  passed away of colon cancer.  She had Crohn's disease.  He has noted his  father also had diabetes and hypertension.   REVIEW OF SYSTEMS:  Polyuria and polydipsia has been going on for three  months.  Otherwise, a 14-point review of systems is negative unless  stated above.   PHYSICAL EXAMINATION:  VITAL SIGNS:  Temperature 97.6, blood pressure  135/75, pulse 93, respirations 14, weight 127 kg, saturation 98% on room  air.  GENERAL:  Obese male, large stature, lying in bed in no acute distress.  HEENT:  Normocephalic, atraumatic.  Pupils equal, round and reactive to  light.  He does have sunken eyelids.  Oropharynx  is very dry, dessicated  with no posterior pharyngeal lesions.  NECK:  Supple, no lymphadenopathy or thyromegaly.  No carotid bruits.  LUNGS:  Clear to auscultation bilaterally.  CARDIOVASCULAR:  Regular rate and rhythm with no murmurs, rubs or  gallops.  Normal S1, S2.  No S3 or S4.  No abdominal bruits.  Dorsalis  pedis pulses are 2+ bilaterally and symmetric.  ABDOMEN:  Soft.  There is mild distention diffusely, and also tenderness  diffusely.  LUNGS:  Clear to auscultation bilaterally.  No rashes.  NEUROLOGICAL:  Alert and oriented x4 with cranial nerves 2-12 grossly  intact.  Strength and sensation grossly intact.   LABORATORY DATA:  White count 10.9, hemoglobin 12.1, hematocrit 36.8,  platelets 188.  Sodium 135, potassium 4.2, chloride 103, bicarbonate 22,  BUN 9, creatinine 0.98, glucose 417, otherwise normal LFTs.  Urinalysis  negative except for greater than 1000 glucose, lipase 27.  CT of the  abdomen and pelvis showed loops of large and small bowel with no  evidence of obstruction or abscess.  EKG is pending.   ASSESSMENT/PLAN:  This  is a patient with history of polysubstance abuse,  depression, suicidal ideation.  Blood sugar on admission December 18, 2007,  glucose 98.  The patient now presents with hyperglycemia after receiving  Zyprexa.  His elevated blood sugar may be related to his Zyprexa, or it  may be related to long-standing diabetes, recently diagnosed.  He is  having profound diarrhea and has evidence of gastroenteritis clinically.  He also has hypovolemia.  1. For his elevated blood sugar, will hold Zyprexa for now and be able      to consult regarding changes in medications.  Will check a TSH and      hemoglobin A1C.  Will follow his blood sugars closely and treat as      needed.  ADA diet.  If his blood sugars are persistently elevated      he may need diabetic education.  He may have to go out on oral      hypoglycemics.  2. For his depression, we will get a behavioral health consult as      above.  He will be on one-to-one suicide precaution.  3. For his diarrhea, will get C. difficile x2.  Will place empirically      on Flagyl as well as Cipro, as this was apparently severe, and will      get WBCs in stool.  4. For his hypovolemia we will give aggressive IV fluids.  5. GI prophylaxis.  Proton pump inhibitor.  6. DVT prophylaxis.  Start on heparin.     Darryl D. Prime, MD  Electronically Signed    DDP/MEDQ  D:  12/23/2007  T:  12/23/2007  Job:  [id number]

## 2011-02-21 NOTE — Consult Note (Signed)
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  He  was initially admitted to [facility] because of depression and  suicidal ideation, several days ago.  However, he immediately needed to  be transferred to the medical hospital for various physical symptoms,  and was subsequently found to have diabetes mellitus, which had not been  previously diagnosed.  At this time, he is appropriate for discharge  from the medical setting, and psychiatric consultation is requested to  reassess mental status and make recommendations.   HISTORY OF THE PRESENTING PROBLEMS:  The patient indicates that he is  divorced, and has a young child.  He has been dealing with depression  for some time, but has not had much in the way of treatment.  He  indicates that since his transfer to [facility], his mood has  improved significantly.  He states that his spirits have been bolstered  greatly by visits from his ex-wife, his in-laws and his daughter.  He  states that they have pledged to provide him what ever support that he  needs in the near future.  Apparently they live in the [place]  area and they have invited him to come live with the mother-in-law.  The  patient feels very good about this.  However, he indicates that he is  interested in follow-up counseling and antidepressant medication.  He  indicates that he does not feel a need to be in the inpatient  psychiatric setting and would rather not return there, but instead  pursue outpatient  treatment.   MEDICAL HISTORY:  Please refer to [facility] team notes.   MENTAL STATUS AND OBSERVATIONS:  The patient has a well-nourished, well-  developed [place] American male who was awake, fully alert, pleasant and  open.  He indicates that he was anticipating my coming to talk to him  and that he has been looking forward to it.  His mood appears to be  neutral.  He does not really appear to be depressed.  His range of  affect is within normal limits, appropriate, and he actually appears  fairly bright.  He talks fairly openly about contributors to his  depression, including the bilateral orchidectomy that he had in 2005 and  his difficulty affording supplemental testosterone therapy in the  meantime, and the various effects that this has had on his psyche.   His thoughts and speech are normally organized.  There is nothing to  suggest any underlying formal thought disorder, psychosis, cognitive or  memory impairment.   IMPRESSION:  The patient is a 35 year old African American male with  significant psychosocial  stressors who initially presented for  depression and suicidal ideation.  At this time, he appears to be  relatively stable with respect to mood.  He acknowledges a problem with  depression and need for help with this in terms of both counseling and  medication and outpatient follow-up, but indicates that he would prefer  not to return to the inpatient psychiatric setting.  He sincerely and  convincingly denies any suicidal ideation at this time.   DIAGNOSTIC IMPRESSION:  AXIS I:  Major depressive disorder, recurrent.  AXIS II:  Deferred.  AXIS III:  History of diabetes mellitus, possibly recent onset.  AXIS IV:  Stressors severe.  AXIS V:  GAF 70.   RECOMMENDATIONS:  The patient was apparently started on a trial of  Zyprexa at [facility] prior to his transfer here.  Apparently,  this was in part due to his having  shown some agitation there.  I would  not  recommend continuation of Zyprexa, as I do not feel that there is a  strong indication for this level of antipsychotic medication, and  because of its potential metabolic effects as well.  The patient is open  to a trial of an antidepressant medication and I am recommending a trial  of Zoloft 50 mg daily.  I would suggest that the [facility] team write  him a prescription for Zoloft 50 mg daily along with his other  medications, and would ask the social work services assist the patient  in obtaining a follow-up appointment in the [facility] area, where  he states he will be living.  Also, he could be referred to individual  psychotherapy in that same area.   At this time, there does not appear to be any need for continuing the  one-to-one sitter.   Thank you for involving me in this patient's care.      Anselm Jungling, MD  Electronically Signed     SPB/MEDQ  D:  12/26/2007  T:  12/26/2007  Job:  [id number]

## 2011-02-21 NOTE — H&P (Signed)
NAMELYRIK, BURESH NO.:  [id number]   MEDICAL RECORD NO.:  [id number]          PATIENT TYPE:  IPS   LOCATION:  0403                          FACILITY:  [facility]   PHYSICIAN:  Jasmine Pang, M.D. DATE OF BIRTH:  03-05-1976   DATE OF ADMISSION:  01/07/2008  DATE OF DISCHARGE:                       PSYCHIATRIC ADMISSION ASSESSMENT   DATE OF THE ASSESSMENT:  January 07, 2008, at 1525.   IDENTIFYING INFORMATION:  This is a 35 year old African-American male  who is single.  This is a voluntary admission.   HISTORY OF PRESENT ILLNESS:  This 35 year old presented in the emergency  room, last night, complaining of depressed mood, homeless, and  unemployed.  He was most recently diagnosed with Crohn's on March 16  after several days of diarrhea.  He has been referred to the GI Clinic,  but has not been there yet.  He says that this is the same illness that  his mother died of.  He was previously hospitalized at [facility] with  this from March 16-19, 2009.  He has been staying, the past couple of  weeks, in [place] where his ex-wife lives with his young daughter.  He was able to see his daughter and feels good about that, but is upset  that his ex-wife is carrying on a relationship with a man from [place].  He has no place to stay over there, and is basically staying here-and-  there with friends, and on the street.  When asked if he was suicidal,  today, he says I just do not know how to answer that. He is asking to  sleep, was not interested in providing much history today.  Denying  substance abuse.  Denying homicidal thoughts or hallucinations.   PAST PSYCHIATRIC HISTORY:  This is the second [facility] admission.  He has a  history of one prior admission here earlier in March for depression; and  said, at that time, he was hearing voices just telling him to go ahead  and kill himself that he had no reason to go on living.  He cites  homelessness and unemployment as  his primary stressors.  He has a  history of 1 prior admission also to [facility] also for  depression.  He has said that he has a history of several suicide  attempts, but will not specify how.   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  At his  discharge from [place] following his Crohn's diagnosis, March 19th,  there was apparently a plan that he would go and live with his mother-in-  law,  but now he says he is unable to do that; and that he is, again,  homeless.  He denies any legal charges.   FAMILY HISTORY:  Remarkable for a mother with history of schizophrenia,  alcohol and drug history.  He has denied any current substance abuse.   MEDICAL HISTORY:  He reports that [facility] [place].   CURRENT MEDICAL PROBLEMS:  1. History of bilateral orchiectomy in 2005 for testicular torsion.  2. Current diagnosis of Crohn's disease on March 16th.  3. Past medical history is also significant for a remarkable episode      of significant hyperglycemia with glycosuria of 1000 on March 16th      which did coincide also with the start of Zyprexa with causal      relationship unclear.   CURRENT MEDICATIONS:  None.  In the emergency room he was prescribed an  antiemetic and some Vicodin for pain due to a bout of diarrhea with some  abdominal discomfort.  Previously he had been taking AndroGel, but was  given Depo injection of AndroGel on March 19th.   PHYSICAL EXAM AND REVIEW OF SYSTEMS:  Done in the emergency room, and  are noted in the record.  He did have a CT scan done of his pelvis at  that time which notes some borderline dilated loop of small central  bowel, but could reflect localized enteritis, and some left bibasilar  scarring versus subsegmental atelectasis.  He was stabilized in the  emergency room.   DIAGNOSTIC STUDIES:   A CBC:  WBC 10.7, hemoglobin 12.3, hematocrit 35.8,  and platelets 175, 000.  Chemistry:  Sodium 139, potassium 3.8, chloride  103, carbon dioxide 28, BUN 7, creatinine 0.96, and random glucose 110.  Liver enzymes SGOT 20, SGPT 15, alkaline phosphatase 64, and total  bilirubin 0.6, calcium normal at 9.2, albumin 3.4, and lipase 36.   MENTAL STATUS EXAM:  Demontrez is fully alert, but has been staying in his  room today, lounging in the bed with his clothes on, sleeping, says that  he really does not want to talk.  Affect is blunted, but he actually is  fully alert.  He offers very little by way of history.  He admits that  he is upset because his ex-wife has been corresponding with someone in  [place] who is coming down to visit her this week.  He felt that he  could not stay over there in [place].  He was pleased that he got  to see his daughter.  Speech is normal.  Mood is mildly irritable,  somewhat apathetic.  He will not discuss suicidality, but had made  statements that life is just not worth living in the emergency room.  Not sure how to cope with his homelessness, no job prospects.  He says  that he has no where to go.  Cognition is fully intact to person, place,  and situation.  No evidence of psychosis or internal distractions or  hallucinations.   IMPRESSION:  AXIS I:  Depressive disorder, not otherwise specified.  AXIS III:  1. Crohn's disease.  2. History of bilateral orchiectomy.  AXIS IV:  Severe issues with homelessness and joblessness.  AXIS V:  Current 47, past year 48.   PLAN:  To voluntarily admit the patient with every 15-minute checks in  place, and to stabilize his mood and alleviate his suicidal thought.  At  this point, we are going to give him a chance to begin participating  more in group, and giving [place] additional history.  He has a regular diet  ordered, and trazodone 50 mg bedtime p.r.n. insomnia.  We will obtain a  urine drug screen, and will  continue his IM testosterone as needed or  AndroGel.  At this point he has been cooperative with staff and peers,  but not yet attending group.   ESTIMATED LENGTH OF  STAY:  5 days.  Our social worker is going to  evaluate his options.     Margaret A. Lorin Picket, N.P.      Jasmine Pang, M.D.  Electronically Signed   MAS/MEDQ  D:  01/07/2008  T:  01/07/2008  Job:  [id number]

## 2011-02-24 NOTE — Consult Note (Signed)
NAMEARIO, MCDIARMID NO.:  [id number]   MEDICAL RECORD NO.:  [id number]          PATIENT TYPE:  EMS   LOCATION:  MINO                         FACILITY:  [facility]   PHYSICIAN:  Maretta Bees. Vonita Moss, M.D.DATE OF BIRTH:  06/07/1976   DATE OF CONSULTATION:  DATE OF DISCHARGE:                                   CONSULTATION   CONSULTING PHYSICIAN:  Maretta Bees. Vonita Moss, M.D.   REFERRING PHYSICIAN:  Trudi Ida. Denton Lank, M.D.   I was called by the emergency room, in particular Dr. Trudi Ida. Denton Lank, M.D.,  to evaluate this 35 year old black male with about a one day history of  severe right testicular pain with some question of some epididymitis, but he  subsequently had an ultrasound Doppler scan that showed no flow to the right  testicle and this was consistent with torsion of the right spermatic cord.  He has had no problems like this before.   He is on no regular medications.   He was given some Dilaudid in the emergency room tonight.   He smokes a half pack of cigarettes per day.   REVIEW OF SYSTEMS:  The rest of the review of systems is noncontributory.   FAMILY HISTORY:  Noncontributory.   PHYSICAL EXAMINATION:  GENERAL:  He is a somewhat drowsy, heavy-set, black  male who is comfortable at the present time.  He is otherwise alert but  drowsy.  No respiratory distress.  ABDOMEN:  Obese but nontender.  EXTERNAL GENITALIA:  Revealed normal penis, urethra, meatus, scrotum, and  left testicle contents are normal.  The right testicle is swollen and tender  and uncomfortable on palpation.   IMPRESSION:  Torsion, right spermatic cord.   PLAN:  This gentleman needs emergency scrotal exploration and possible right  orchectomy if the testicle is necrotic and either a unilateral or bilateral  testicular fixation depending upon on whether he needs an orchectomy or not.  This gentleman has had some Dilaudid, but he understands the problem, and I  have talked to his wife who  understands and agrees.  I told him that an orchectomy would not change his  male characteristics but would have a possible 20% decrease in fertility in  the future.  I told him that a necrotic testicle, if that is what we find,  can end up being very sore and sensitive and therefore, should be removed if  it does not look like it can be salvaged.       LJP/MEDQ  D:  08/11/2004  T:  08/11/2004  Job:  [id number]   cc:   Trudi Ida. Denton Lank, M.D.  [address]  [place] [address]  Fax: [phone]
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
## 2011-02-24 NOTE — Discharge Summary (Signed)
NAMEMETE, PURDUM NO.:  [id number]   MEDICAL RECORD NO.:  [id number]                   PATIENT TYPE:  IPS   LOCATION:  0304                                 FACILITY:  [facility]   PHYSICIAN:  Jeanice Lim, M.D.              DATE OF BIRTH:  [birth year]   DATE OF ADMISSION:  10/22/2003  DATE OF DISCHARGE:  10/23/2003                                 DISCHARGE SUMMARY   IDENTIFYING DATA:  This is a 35 year old single African-American male  involuntarily committed with a history of psychosis and suicidal ideation.  He went to the [facility] to obtain some  medications.  He stated he was having suicidal thoughts in order to get  medication and they, instead, committed him.   MEDICATIONS:  Medications in the past included Zoloft and Zyprexa.  He had  been off for two months and just wanted to be back on Zoloft since it had  helped.   DRUG ALLERGIES:  No known drug allergies.   PHYSICAL EXAMINATION:  GENERAL:  Within normal limits.  NEUROLOGIC:  Nonfocal.   LABORATORY DATA:  Routine admission labs:  Within normal limits.   MENTAL STATUS EXAM:  Alert, young African-American male, cooperative, little  eye contact.  Speech: Clear.  Mood: Somewhat angry about being committed.  Affect: Irritable, possibly mildly depressed.  Thought process: Goal  directed; no evidence of psychotic symptoms or dangerous ideation.  Cognitive: Intact.  Judgment and insight: Fair.  Questionable historian.   ADMISSION DIAGNOSES:   AXIS I:  Major depressive disorder, mild, recurrent.   AXIS II:  Deferred.   AXIS III:  None.   AXIS IV:  Moderate problems with primary support group.   AXIS V:  30/60   HOSPITAL COURSE:  The patient was admitted, ordered routine p.r.n.  medications, underwent further monitoring, and was encouraged to participate  in individual, group, and milieu therapy.  Collateral history was obtained.  The patient was given  healthier coping skills and was monitored for safety.  The patient showed no dangerous ideation.  Collateral report confirmed that  he had not been suicidal and that he, in fact, actually wanted to get  disability as well as the Zoloft.  Girlfriend gives consistent report.   CONDITION ON DISCHARGE:  The patient was discharged in improved condition  with no dangerous ideation and increased insight regarding the importance of  reporting true symptoms as well as being compliant with aftercare.   DISCHARGE MEDICATIONS:  Zoloft 50 mg q.a.m.   FOLLOW UP:  He was to follow up at [facility] on  October 28, 2003.   DISCHARGE DIAGNOSES:   AXIS I:  Major depressive disorder, mild, recurrent.   AXIS II:  Deferred.   AXIS III:  None.   AXIS IV:  Moderate problems with primary support group.   AXIS V:  Global assessment of functioning on  discharge was 55.                                               Jeanice Lim, M.D.    JEM/MEDQ  D:  11/18/2003  T:  11/19/2003  Job:  [id number]
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
## 2011-02-24 NOTE — Op Note (Signed)
NAMEEAMES, DIBIASIO NO.:  [id number]   MEDICAL RECORD NO.:  [id number]          PATIENT TYPE:  OBV   LOCATION:  1823                         FACILITY:  [facility]   PHYSICIAN:  Maretta Bees. Vonita Moss, M.D.DATE OF BIRTH:  09/05/1976   DATE OF PROCEDURE:  08/11/2004  DATE OF DISCHARGE:                                 OPERATIVE REPORT   PREOPERATIVE DIAGNOSIS:  Torsion, right spermatic cord.   POSTOPERATIVE DIAGNOSIS:  Torsion, right spermatic cord.   OPERATION PERFORMED:  Scrotal exploration, detorsion of right spermatic cord  and bilateral testicular fixation.   SURGEON:  Maretta Bees. Vonita Moss, M.D.   ANESTHESIA:  General.   INDICATIONS FOR PROCEDURE:  This 35 year old black male presented to the  emergency room with what appeared to be about a 24-hour history of pain in  the right scrotum although his history was somewhat indefinite. He was  worked up in the emergency room and a Doppler ultrasound performed that  showed no blood flow to the right testicle.  He was brought to the operating  room tonight for surgical exploration, with both him and his wife being  counseled about possible orchiectomy and the need for fixation of the  testicles or testicle.   DESCRIPTION OF PROCEDURE:  The patient was brought to the operating room and  placed in supine position and the external genitalia were prepped and draped  in the usual fashion.  A midline scrotal incision was made and the right  scrotal compartment entered and the inflammatory hydrocele opened up and he  was noted to have a 720 degree twist of the right spermatic cord which was  untwisted.  The testicle and epididymis were fairly blue at first, but using  warm compresses while working on the testicle and sewing it in places  described below.  The right testicle pinked up some and in incision in the  capsule revealed bright red blood flow which was then closed with running 4-  0 chromic catgut.  Having already sutured  the left testicle in position by  placing 4-0 black silk between the anterior tunica albuginea of the testicle  and the overlying scrotal wall, the right side was then sutured in position  in like fashion to prevent future torsion.  At this point the midline  scrotal incision was closed with an interrupted deep layer of 3-0 chromic  catgut and a running deep layer of 3-0 chromic catgut and then a running  scrotal skin closure with 3-0 chromic catgut followed by some interrupted  sutures in the scrotal skin.  The wound was cleaned and dressed with  Neosporin, dry sterile gauze and he was taken to the recovery room in good  condition.  Sponge, needle and instrument count was correct.  The estimated  blood loss was negligible.  The patient tolerated the procedure well.       LJP/MEDQ  D:  08/11/2004  T:  08/11/2004  Job:  [id number]

## 2011-03-01 ENCOUNTER — Emergency Department: Payer: Self-pay | Admitting: Unknown Physician Specialty

## 2011-03-14 ENCOUNTER — Inpatient Hospital Stay: Payer: Self-pay | Admitting: Internal Medicine

## 2011-05-05 ENCOUNTER — Emergency Department: Payer: Self-pay | Admitting: Emergency Medicine

## 2011-05-18 ENCOUNTER — Emergency Department: Payer: Self-pay | Admitting: Unknown Physician Specialty

## 2011-07-03 LAB — CLOSTRIDIUM DIFFICILE EIA
C difficile Toxins A+B, EIA: NEGATIVE
C difficile Toxins A+B, EIA: NEGATIVE

## 2011-07-03 LAB — BASIC METABOLIC PANEL
BUN: 10
CO2: 23
CO2: 27
Calcium: 9.1
Calcium: 9.2
Calcium: 9.2
Chloride: 103
Creatinine, Ser: 0.87
Creatinine, Ser: 0.88
GFR calc Af Amer: >60
GFR calc Af Amer: >60
GFR calc Af Amer: >60
GFR calc non Af Amer: >60
GFR calc non Af Amer: >60
Glucose, Bld: 113 — ABNORMAL HIGH
Glucose, Bld: 97
Potassium: 3.8
Potassium: 4
Sodium: 137
Sodium: 139

## 2011-07-03 LAB — HEMOGLOBIN A1C: Hgb A1c MFr Bld: 6.2 — ABNORMAL HIGH

## 2011-07-03 LAB — COMPREHENSIVE METABOLIC PANEL
ALT: 18
ALT: 15
ALT: 23
ALT: 23
AST: 20
Albumin: 3.3 — ABNORMAL LOW
Alkaline Phosphatase: 58
Alkaline Phosphatase: 65
Alkaline Phosphatase: 72
BUN: 9
BUN: 10
BUN: 9
CO2: 22
CO2: 25
CO2: 28
Calcium: 9.1
Calcium: 9.2
GFR calc Af Amer: >60
GFR calc non Af Amer: >60
GFR calc non Af Amer: >60
GFR calc non Af Amer: >60
Glucose, Bld: 180 — ABNORMAL HIGH
Glucose, Bld: 417 — ABNORMAL HIGH
Potassium: 3.6
Potassium: 3.7
Potassium: 4.2
Sodium: 141
Sodium: 138
Sodium: 139
Total Bilirubin: 0.7
Total Protein: 6.9
Total Protein: 7.2
Total Protein: 7.3

## 2011-07-03 LAB — CBC
HCT: 34.8 — ABNORMAL LOW
HCT: 35.4 — ABNORMAL LOW
HCT: 36.8 — ABNORMAL LOW
Hemoglobin: 11.8 — ABNORMAL LOW
Hemoglobin: 12.1 — ABNORMAL LOW
Hemoglobin: 12.1 — ABNORMAL LOW
MCHC: 33.3
MCHC: 32.8
MCHC: 34
MCHC: 34.2
MCV: 79.8
Platelets: 165
RBC: 4.7
RBC: 4.5
RBC: 4.56
RBC: 4.6
RBC: 4.74
RDW: 14.7
RDW: 14.8
RDW: 15.1
WBC: 11.1 — ABNORMAL HIGH

## 2011-07-03 LAB — RAPID URINE DRUG SCREEN, HOSP PERFORMED
Amphetamines: NOT DETECTED
Barbiturates: NOT DETECTED
Barbiturates: NOT DETECTED
Opiates: NOT DETECTED
Tetrahydrocannabinol: POSITIVE — AB
Tetrahydrocannabinol: POSITIVE — AB

## 2011-07-03 LAB — I-STAT 8, (EC8 V) (CONVERTED LAB)
BUN: 10
Chloride: 107
Glucose, Bld: 107 — ABNORMAL HIGH
HCT: 40
Hemoglobin: 13.6
Potassium: 3.3 — ABNORMAL LOW
Sodium: 141

## 2011-07-03 LAB — DIFFERENTIAL
Basophils Absolute: 0
Basophils Relative: 1
Basophils Relative: 0
Eosinophils Absolute: 0.5
Eosinophils Absolute: 0.3
Eosinophils Absolute: 0.4
Eosinophils Relative: 6 — ABNORMAL HIGH
Eosinophils Relative: 3
Lymphocytes Relative: 26
Lymphocytes Relative: 14
Lymphs Abs: 2.9
Lymphs Abs: 2
Monocytes Absolute: 1
Monocytes Relative: 8
Monocytes Relative: 7
Neutro Abs: 6.7
Neutrophils Relative %: 60
Neutrophils Relative %: 67
Neutrophils Relative %: 73

## 2011-07-03 LAB — URINALYSIS, ROUTINE W REFLEX MICROSCOPIC
Bilirubin Urine: NEGATIVE
Bilirubin Urine: NEGATIVE
Glucose, UA: NEGATIVE
Ketones, ur: NEGATIVE
Ketones, ur: NEGATIVE
Leukocytes, UA: NEGATIVE
Nitrite: NEGATIVE
Protein, ur: NEGATIVE
Specific Gravity, Urine: 1.014
Urobilinogen, UA: 0.2
pH: 5
pH: 7

## 2011-07-03 LAB — PHOSPHORUS: Phosphorus: 4.6

## 2011-07-03 LAB — POCT I-STAT CREATININE: Creatinine, Ser: 1.2

## 2011-07-03 LAB — FECAL LACTOFERRIN, QUANT: Fecal Lactoferrin: POSITIVE

## 2011-07-03 LAB — ETHANOL
Alcohol, Ethyl (B): <5
Alcohol, Ethyl (B): <5

## 2011-07-03 LAB — MAGNESIUM: Magnesium: 1.8

## 2011-07-03 LAB — LIPASE, BLOOD: Lipase: 27

## 2011-07-04 LAB — CBC
HCT: 34.3 — ABNORMAL LOW
Hemoglobin: 11.3 — ABNORMAL LOW
MCHC: 33
RBC: 4.36
RDW: 15.1

## 2011-07-04 LAB — DIFFERENTIAL
Basophils Absolute: 0
Basophils Relative: 0
Eosinophils Absolute: 0.4
Monocytes Relative: 11
Neutro Abs: 4.6
Neutrophils Relative %: 57

## 2011-07-04 LAB — LIPASE, BLOOD: Lipase: 35

## 2011-07-04 LAB — URINALYSIS, ROUTINE W REFLEX MICROSCOPIC
Glucose, UA: NEGATIVE
Ketones, ur: NEGATIVE
Nitrite: NEGATIVE
Protein, ur: NEGATIVE
Urobilinogen, UA: 1

## 2011-07-04 LAB — COMPREHENSIVE METABOLIC PANEL
Alkaline Phosphatase: 57
BUN: 10
CO2: 28
GFR calc non Af Amer: >60
Glucose, Bld: 107 — ABNORMAL HIGH
Potassium: 4
Total Protein: 7

## 2011-10-29 ENCOUNTER — Emergency Department: Payer: Self-pay | Admitting: Unknown Physician Specialty

## 2011-10-29 LAB — DRUG SCREEN, URINE
Barbiturates, Ur Screen: NEGATIVE (ref ?–200)
Benzodiazepine, Ur Scrn: NEGATIVE (ref ?–200)
Cannabinoid 50 Ng, Ur ~~LOC~~: POSITIVE (ref ?–50)
MDMA (Ecstasy)Ur Screen: NEGATIVE (ref ?–500)
Methadone, Ur Screen: NEGATIVE (ref ?–300)
Opiate, Ur Screen: NEGATIVE (ref ?–300)
Phencyclidine (PCP) Ur S: NEGATIVE (ref ?–25)

## 2011-10-29 LAB — URINALYSIS, COMPLETE
Bacteria: NONE SEEN
Glucose,UR: >=500 mg/dL (ref 0–75)
Ketone: NEGATIVE
Leukocyte Esterase: NEGATIVE
Nitrite: NEGATIVE
Ph: 6 (ref 4.5–8.0)
Protein: NEGATIVE
Squamous Epithelial: <1
WBC UR: <1 /HPF (ref 0–5)

## 2011-10-29 LAB — LIPASE, BLOOD: Lipase: 130 U/L (ref 73–393)

## 2011-10-29 LAB — COMPREHENSIVE METABOLIC PANEL
Alkaline Phosphatase: 83 U/L (ref 50–136)
Anion Gap: 9 (ref 7–16)
Bilirubin,Total: 0.2 mg/dL (ref 0.2–1.0)
Calcium, Total: 9 mg/dL (ref 8.5–10.1)
Chloride: 99 mmol/L (ref 98–107)
Co2: 30 mmol/L (ref 21–32)
Creatinine: 0.98 mg/dL (ref 0.60–1.30)
EGFR (African American): >60
EGFR (Non-African Amer.): >60
Osmolality: 301 (ref 275–301)
Potassium: 4.2 mmol/L (ref 3.5–5.1)
SGPT (ALT): 23 U/L
Sodium: 138 mmol/L (ref 136–145)
Total Protein: 7.8 g/dL (ref 6.4–8.2)

## 2011-10-29 LAB — CBC
MCHC: 33.3 g/dL (ref 32.0–36.0)
MCV: 79 fL — ABNORMAL LOW (ref 80–100)
Platelet: 132 10*3/uL — ABNORMAL LOW (ref 150–440)
RDW: 14 % (ref 11.5–14.5)

## 2011-10-29 LAB — TROPONIN I: Troponin-I: <0.02 ng/mL

## 2011-11-01 ENCOUNTER — Emergency Department: Payer: Self-pay | Admitting: Emergency Medicine

## 2011-11-01 LAB — URINALYSIS, COMPLETE
Bacteria: NONE SEEN
Bilirubin,UR: NEGATIVE
Blood: NEGATIVE
Glucose,UR: >=500 mg/dL
Ketone: NEGATIVE
Leukocyte Esterase: NEGATIVE
Nitrite: NEGATIVE
Ph: 7
Protein: NEGATIVE
RBC,UR: <1 /HPF
Specific Gravity: 1.014
Squamous Epithelial: NONE SEEN
WBC UR: <1 /HPF

## 2011-11-01 LAB — CBC WITH DIFFERENTIAL/PLATELET
Basophil #: 0.1 10*3/uL (ref 0.0–0.1)
Basophil %: 0.8 %
Eosinophil #: 0.2 10*3/uL (ref 0.0–0.7)
HCT: 34.2 % — ABNORMAL LOW (ref 40.0–52.0)
HGB: 11.5 g/dL — ABNORMAL LOW (ref 13.0–18.0)
Lymphocyte #: 2 10*3/uL (ref 1.0–3.6)
Lymphocyte %: 29.9 %
MCH: 26.6 pg (ref 26.0–34.0)
MCHC: 33.7 g/dL (ref 32.0–36.0)
MCV: 79 fL — ABNORMAL LOW (ref 80–100)
Monocyte #: 0.4 10*3/uL (ref 0.0–0.7)
Monocyte %: 6.5 %
Neutrophil #: 3.8 10*3/uL (ref 1.4–6.5)
Platelet: 142 10*3/uL — ABNORMAL LOW (ref 150–440)
RDW: 13.3 % (ref 11.5–14.5)
WBC: 6.5 10*3/uL (ref 3.8–10.6)

## 2011-11-01 LAB — BASIC METABOLIC PANEL
BUN: 13 mg/dL (ref 7–18)
Calcium, Total: 9.3 mg/dL (ref 8.5–10.1)
Chloride: 98 mmol/L (ref 98–107)
Co2: 30 mmol/L (ref 21–32)
Creatinine: 1.01 mg/dL (ref 0.60–1.30)
EGFR (Non-African Amer.): >60
Glucose: 368 mg/dL — ABNORMAL HIGH (ref 65–99)
Osmolality: 289 (ref 275–301)
Potassium: 3.8 mmol/L (ref 3.5–5.1)
Sodium: 137 mmol/L (ref 136–145)

## 2011-11-06 ENCOUNTER — Emergency Department: Payer: Self-pay | Admitting: *Deleted

## 2011-11-06 LAB — URINALYSIS, COMPLETE
Bilirubin,UR: NEGATIVE
Ketone: NEGATIVE
Ph: 7 (ref 4.5–8.0)
RBC,UR: NONE SEEN /HPF (ref 0–5)
Specific Gravity: 1.02 (ref 1.003–1.030)
WBC UR: <1 /HPF (ref 0–5)

## 2011-11-06 LAB — COMPREHENSIVE METABOLIC PANEL
Alkaline Phosphatase: 90 U/L (ref 50–136)
Anion Gap: 10 (ref 7–16)
BUN: 21 mg/dL — ABNORMAL HIGH (ref 7–18)
Bilirubin,Total: 0.4 mg/dL (ref 0.2–1.0)
Calcium, Total: 9.1 mg/dL (ref 8.5–10.1)
Chloride: 93 mmol/L — ABNORMAL LOW (ref 98–107)
Co2: 30 mmol/L (ref 21–32)
EGFR (African American): >60
EGFR (Non-African Amer.): >60
Osmolality: 307 (ref 275–301)
SGPT (ALT): 33 U/L
Sodium: 133 mmol/L — ABNORMAL LOW (ref 136–145)

## 2011-11-06 LAB — CBC
HGB: 12.5 g/dL — ABNORMAL LOW (ref 13.0–18.0)
MCH: 26.3 pg (ref 26.0–34.0)
MCHC: 32.8 g/dL (ref 32.0–36.0)
Platelet: 147 10*3/uL — ABNORMAL LOW (ref 150–440)
RBC: 4.77 10*6/uL (ref 4.40–5.90)
RDW: 14.9 % — ABNORMAL HIGH (ref 11.5–14.5)

## 2011-11-07 LAB — BASIC METABOLIC PANEL
BUN: 18 mg/dL (ref 7–18)
Chloride: 100 mmol/L (ref 98–107)
Creatinine: 0.85 mg/dL (ref 0.60–1.30)
EGFR (African American): >60
EGFR (Non-African Amer.): >60
Osmolality: 292 (ref 275–301)
Potassium: 3.9 mmol/L (ref 3.5–5.1)
Sodium: 137 mmol/L (ref 136–145)

## 2011-11-07 LAB — SEDIMENTATION RATE: Erythrocyte Sed Rate: 26 mm/hr — ABNORMAL HIGH (ref 0–15)

## 2011-11-14 ENCOUNTER — Emergency Department: Payer: Self-pay | Admitting: Emergency Medicine

## 2011-11-14 LAB — URINALYSIS, COMPLETE
Blood: NEGATIVE
Ketone: NEGATIVE
Leukocyte Esterase: NEGATIVE
Ph: 6 (ref 4.5–8.0)
Protein: NEGATIVE
Specific Gravity: 1.022 (ref 1.003–1.030)

## 2011-11-14 LAB — BASIC METABOLIC PANEL
Anion Gap: 11 (ref 7–16)
BUN: 17 mg/dL (ref 7–18)
Chloride: 98 mmol/L (ref 98–107)
Creatinine: 0.87 mg/dL (ref 0.60–1.30)
EGFR (African American): >60
Glucose: 452 mg/dL — ABNORMAL HIGH (ref 65–99)
Sodium: 137 mmol/L (ref 136–145)

## 2011-11-14 LAB — PLATELET COUNT: Platelet: 139 10*3/uL — ABNORMAL LOW (ref 150–440)

## 2011-12-02 LAB — URINALYSIS, COMPLETE
Bacteria: NONE SEEN
Blood: NEGATIVE
Glucose,UR: >=500 mg/dL (ref 0–75)
Ketone: NEGATIVE
Nitrite: NEGATIVE
Ph: 7 (ref 4.5–8.0)
Protein: NEGATIVE
RBC,UR: NONE SEEN /HPF (ref 0–5)
Specific Gravity: 1.022 (ref 1.003–1.030)
WBC UR: NONE SEEN /HPF (ref 0–5)

## 2011-12-02 LAB — COMPREHENSIVE METABOLIC PANEL
Albumin: 3.6 g/dL (ref 3.4–5.0)
Alkaline Phosphatase: 98 U/L (ref 50–136)
BUN: 16 mg/dL (ref 7–18)
Bilirubin,Total: 0.4 mg/dL (ref 0.2–1.0)
Calcium, Total: 9 mg/dL (ref 8.5–10.1)
Chloride: 89 mmol/L — ABNORMAL LOW (ref 98–107)
Creatinine: 1.28 mg/dL (ref 0.60–1.30)
Glucose: 821 mg/dL (ref 65–99)
Potassium: 4.4 mmol/L (ref 3.5–5.1)
SGOT(AST): 11 U/L — ABNORMAL LOW (ref 15–37)
SGPT (ALT): 17 U/L
Total Protein: 7.9 g/dL (ref 6.4–8.2)

## 2011-12-02 LAB — CBC
MCHC: 32.5 g/dL (ref 32.0–36.0)
MCV: 80 fL (ref 80–100)
Platelet: 133 10*3/uL — ABNORMAL LOW (ref 150–440)
RDW: 14.4 % (ref 11.5–14.5)
WBC: 7.4 10*3/uL (ref 3.8–10.6)

## 2011-12-03 ENCOUNTER — Inpatient Hospital Stay: Payer: Self-pay | Admitting: Internal Medicine

## 2011-12-03 LAB — BASIC METABOLIC PANEL
Anion Gap: 11 (ref 7–16)
BUN: 13 mg/dL (ref 7–18)
Calcium, Total: 8.8 mg/dL (ref 8.5–10.1)
Chloride: 99 mmol/L (ref 98–107)
Co2: 30 mmol/L (ref 21–32)
Creatinine: 0.9 mg/dL (ref 0.60–1.30)
EGFR (African American): >60
Osmolality: 291 (ref 275–301)
Sodium: 140 mmol/L (ref 136–145)

## 2011-12-04 LAB — COMPREHENSIVE METABOLIC PANEL
Alkaline Phosphatase: 76 U/L (ref 50–136)
BUN: 10 mg/dL (ref 7–18)
Bilirubin,Total: 0.3 mg/dL (ref 0.2–1.0)
Chloride: 104 mmol/L (ref 98–107)
Co2: 28 mmol/L (ref 21–32)
Creatinine: 0.73 mg/dL (ref 0.60–1.30)
EGFR (Non-African Amer.): >60
SGOT(AST): 152 U/L — ABNORMAL HIGH (ref 15–37)
SGPT (ALT): 68 U/L
Sodium: 143 mmol/L (ref 136–145)
Total Protein: 6.5 g/dL (ref 6.4–8.2)

## 2011-12-15 ENCOUNTER — Emergency Department: Payer: Self-pay | Admitting: Emergency Medicine

## 2011-12-18 LAB — WOUND AEROBIC CULTURE

## 2011-12-26 ENCOUNTER — Emergency Department: Payer: Self-pay | Admitting: Internal Medicine

## 2011-12-26 LAB — COMPREHENSIVE METABOLIC PANEL
Albumin: 3.8 g/dL (ref 3.4–5.0)
Alkaline Phosphatase: 99 U/L (ref 50–136)
BUN: 14 mg/dL (ref 7–18)
Calcium, Total: 9.4 mg/dL (ref 8.5–10.1)
Creatinine: 0.96 mg/dL (ref 0.60–1.30)
Glucose: 549 mg/dL (ref 65–99)
Potassium: 4.4 mmol/L (ref 3.5–5.1)
SGOT(AST): 18 U/L (ref 15–37)
SGPT (ALT): 19 U/L
Total Protein: 8.3 g/dL — ABNORMAL HIGH (ref 6.4–8.2)

## 2011-12-26 LAB — URINALYSIS, COMPLETE
Bacteria: NONE SEEN
Bilirubin,UR: NEGATIVE
Blood: NEGATIVE
Glucose,UR: >=500 mg/dL (ref 0–75)
Leukocyte Esterase: NEGATIVE
Specific Gravity: 1.022 (ref 1.003–1.030)
Squamous Epithelial: <1

## 2011-12-26 LAB — CBC
HCT: 38.9 % — ABNORMAL LOW (ref 40.0–52.0)
HGB: 12.8 g/dL — ABNORMAL LOW (ref 13.0–18.0)
MCHC: 33 g/dL (ref 32.0–36.0)
Platelet: 135 10*3/uL — ABNORMAL LOW (ref 150–440)
RBC: 5 10*6/uL (ref 4.40–5.90)
WBC: 8.9 10*3/uL (ref 3.8–10.6)

## 2012-01-11 ENCOUNTER — Emergency Department: Payer: Self-pay | Admitting: Emergency Medicine

## 2012-01-12 LAB — CBC WITH DIFFERENTIAL/PLATELET
Basophil %: 0.2 %
Lymphocyte #: 1.5 10*3/uL (ref 1.0–3.6)
MCH: 25.8 pg — ABNORMAL LOW (ref 26.0–34.0)
MCV: 78 fL — ABNORMAL LOW (ref 80–100)
Monocyte #: 0.7 10*3/uL (ref 0.0–0.7)
Monocyte %: 7.7 %
Neutrophil #: 6.6 10*3/uL — ABNORMAL HIGH (ref 1.4–6.5)
Neutrophil %: 73.8 %
Platelet: 127 10*3/uL — ABNORMAL LOW (ref 150–440)
RBC: 5.3 10*6/uL (ref 4.40–5.90)
RDW: 14.3 % (ref 11.5–14.5)
WBC: 8.9 10*3/uL (ref 3.8–10.6)

## 2012-01-12 LAB — COMPREHENSIVE METABOLIC PANEL
Anion Gap: 12 (ref 7–16)
BUN: 14 mg/dL (ref 7–18)
Bilirubin,Total: 0.6 mg/dL (ref 0.2–1.0)
Creatinine: 1.11 mg/dL (ref 0.60–1.30)
EGFR (Non-African Amer.): >60
Glucose: 579 mg/dL (ref 65–99)
SGPT (ALT): 19 U/L
Total Protein: 8.2 g/dL (ref 6.4–8.2)

## 2012-01-17 ENCOUNTER — Observation Stay (HOSPITAL_COMMUNITY)
Admission: EM | Admit: 2012-01-17 | Discharge: 2012-01-17 | Disposition: A | Discharge status: Home / Self Care | Payer: Self-pay | Attending: Emergency Medicine | Admitting: Emergency Medicine

## 2012-01-17 ENCOUNTER — Emergency Department (HOSPITAL_COMMUNITY): Payer: Self-pay

## 2012-01-17 ENCOUNTER — Encounter (HOSPITAL_COMMUNITY): Payer: Self-pay | Admitting: *Deleted

## 2012-01-17 DIAGNOSIS — M549 Dorsalgia, unspecified: Secondary | ICD-10-CM | POA: Insufficient documentation

## 2012-01-17 DIAGNOSIS — G8929 Other chronic pain: Secondary | ICD-10-CM | POA: Insufficient documentation

## 2012-01-17 DIAGNOSIS — Z5900 Homelessness unspecified: Secondary | ICD-10-CM | POA: Insufficient documentation

## 2012-01-17 DIAGNOSIS — E119 Type 2 diabetes mellitus without complications: Principal | ICD-10-CM | POA: Insufficient documentation

## 2012-01-17 DIAGNOSIS — R739 Hyperglycemia, unspecified: Secondary | ICD-10-CM

## 2012-01-17 DIAGNOSIS — Z794 Long term (current) use of insulin: Secondary | ICD-10-CM | POA: Insufficient documentation

## 2012-01-17 LAB — DIFFERENTIAL
Basophils Absolute: 0.1 10*3/uL (ref 0.0–0.1)
Basophils Relative: 1 % (ref 0–1)
Eosinophils Absolute: 0.2 10*3/uL (ref 0.0–0.7)
Lymphocytes Relative: 18 % (ref 12–46)
Monocytes Absolute: 0.6 10*3/uL (ref 0.1–1.0)
Neutrophils Relative %: 70 % (ref 43–77)
Smear Review: DECREASED

## 2012-01-17 LAB — POCT I-STAT 3, VENOUS BLOOD GAS (G3P V)
pCO2, Ven: 38.8 mmHg — ABNORMAL LOW (ref 45.0–50.0)
pH, Ven: 7.454 — ABNORMAL HIGH (ref 7.250–7.300)
pO2, Ven: 168 mmHg — ABNORMAL HIGH (ref 30.0–45.0)

## 2012-01-17 LAB — URINE MICROSCOPIC-ADD ON

## 2012-01-17 LAB — URINALYSIS, ROUTINE W REFLEX MICROSCOPIC
Nitrite: NEGATIVE
Specific Gravity, Urine: 1.029 (ref 1.005–1.030)
Urobilinogen, UA: 0.2 mg/dL (ref 0.0–1.0)
pH: 5.5 (ref 5.0–8.0)

## 2012-01-17 LAB — GLUCOSE, CAPILLARY
Glucose-Capillary: >600 mg/dL (ref 70–99)
Glucose-Capillary: 562 mg/dL (ref 70–99)
Glucose-Capillary: 478 mg/dL — ABNORMAL HIGH (ref 70–99)
Glucose-Capillary: 431 mg/dL — ABNORMAL HIGH (ref 70–99)
Glucose-Capillary: 320 mg/dL — ABNORMAL HIGH (ref 70–99)
Glucose-Capillary: 228 mg/dL — ABNORMAL HIGH (ref 70–99)

## 2012-01-17 LAB — COMPREHENSIVE METABOLIC PANEL
ALT: 12 U/L (ref 0–53)
AST: 17 U/L (ref 0–37)
CO2: 23 mEq/L (ref 19–32)
Calcium: 9.1 mg/dL (ref 8.4–10.5)
Creatinine, Ser: 0.63 mg/dL (ref 0.50–1.35)
GFR calc Af Amer: >90 mL/min (ref >90)
GFR calc non Af Amer: >90 mL/min (ref >90)
Glucose, Bld: 674 mg/dL (ref 70–99)
Sodium: 133 mEq/L — ABNORMAL LOW (ref 135–145)
Total Protein: 7 g/dL (ref 6.0–8.3)

## 2012-01-17 LAB — CBC
MCH: 25.4 pg — ABNORMAL LOW (ref 26.0–34.0)
MCHC: 34 g/dL (ref 30.0–36.0)
RDW: 13.4 % (ref 11.5–15.5)

## 2012-01-17 MED ORDER — MORPHINE SULFATE 4 MG/ML IJ SOLN
4.0000 mg | Freq: Once | INTRAMUSCULAR | Status: AC
  Administered 2012-01-17: 4 mg via INTRAVENOUS
  Filled 2012-01-17: qty 1

## 2012-01-17 MED ORDER — KETOROLAC TROMETHAMINE 30 MG/ML IJ SOLN
30.0000 mg | Freq: Once | INTRAMUSCULAR | Status: AC
  Administered 2012-01-17: 30 mg via INTRAVENOUS
  Filled 2012-01-17: qty 1

## 2012-01-17 MED ORDER — ONDANSETRON HCL 4 MG/2ML IJ SOLN
4.0000 mg | Freq: Once | INTRAMUSCULAR | Status: AC
  Administered 2012-01-17: 4 mg via INTRAVENOUS
  Filled 2012-01-17: qty 2

## 2012-01-17 MED ORDER — HYDROCODONE-ACETAMINOPHEN 5-325 MG PO TABS
1.0000 | ORAL_TABLET | Freq: Once | ORAL | Status: AC
  Administered 2012-01-17: 1 via ORAL
  Filled 2012-01-17: qty 1

## 2012-01-17 MED ORDER — INSULIN REGULAR HUMAN 100 UNIT/ML IJ SOLN
INTRAMUSCULAR | Status: DC
  Administered 2012-01-17: 7.4 [IU]/h via INTRAVENOUS
  Administered 2012-01-17: 4.2 [IU]/h via INTRAVENOUS
  Administered 2012-01-17: 5 [IU]/h via INTRAVENOUS
  Filled 2012-01-17: qty 1

## 2012-01-17 MED ORDER — SODIUM CHLORIDE 0.9 % IV BOLUS (SEPSIS)
1000.0000 mL | Freq: Once | INTRAVENOUS | Status: AC
  Administered 2012-01-17: 1000 mL via INTRAVENOUS

## 2012-01-17 MED ORDER — SODIUM CHLORIDE 0.9 % IV SOLN
1000.0000 mL | INTRAVENOUS | Status: DC
  Administered 2012-01-17: 1000 mL via INTRAVENOUS

## 2012-01-17 NOTE — ED Notes (Signed)
Social worker giving pt a Electronics engineer.

## 2012-01-17 NOTE — ED Notes (Signed)
Pt requesting a "cab voucher" to get him home to [facility] in [place] (a shelter).  The number for the shelter is [phone], ext. [phone].  Social worker called, message left.

## 2012-01-17 NOTE — ED Notes (Signed)
Pt states unable to obtain specimen at this time, aware specimen needed.

## 2012-01-17 NOTE — ED Notes (Signed)
Pt reports having high cbg, having excessive thirst, bodyaches, frequent urination. cbg >600.

## 2012-01-17 NOTE — ED Notes (Signed)
Critical Lab Blood Glucose 674 per Zelda, [employer].

## 2012-01-17 NOTE — ED Provider Notes (Signed)
History     CSN: [id number]  Arrival date & time 01/17/12  7829   First MD Initiated Contact with Patient 01/17/12 1020      Chief Complaint  Patient presents with  . Hyperglycemia    (Consider location/radiation/quality/duration/timing/severity/associated sxs/prior treatment) HPI  Past Medical History  Diagnosis Date  . Diabetes mellitus     Past Surgical History  Procedure Date  . Cholecystectomy     History reviewed. No pertinent family history.  History  Substance Use Topics  . Smoking status: Current Everyday Smoker -- 0.5 packs/day    Types: Cigarettes  . Smokeless tobacco: Not on file  . Alcohol Use: No      Review of Systems  Allergies  Review of patient's allergies indicates no known allergies.  Home Medications   Current Outpatient Rx  Name Route Sig Dispense Refill  . INSULIN GLARGINE 100 UNIT/ML [place] SOLN Subcutaneous Inject 40 Units into the skin 2 (two) times daily.    . TESTOSTERONE 50 MG/5GM TD GEL Transdermal Place 5 g onto the skin daily.      BP 127/82  Pulse 83  Temp(Src) 97.6 F (36.4 C) (Oral)  Resp 20  SpO2 98%  Physical Exam  ED Course  Procedures (including critical care time)  Labs Reviewed  GLUCOSE, CAPILLARY - Abnormal; Notable for the following:    Glucose-Capillary >600 (*)    All other components within normal limits  CBC - Abnormal; Notable for the following:    Hemoglobin 12.7 (*)    HCT 37.4 (*)    MCV 74.8 (*)    MCH 25.4 (*)    Platelets 133 (*)    All other components within normal limits  COMPREHENSIVE METABOLIC PANEL - Abnormal; Notable for the following:    Sodium 133 (*)    Chloride 95 (*)    Glucose, Bld 674 (*)    Albumin 3.4 (*)    Total Bilirubin 0.2 (*)    All other components within normal limits  URINALYSIS, ROUTINE W REFLEX MICROSCOPIC - Abnormal; Notable for the following:    Glucose, UA >1000 (*)    All other components within normal limits  POCT I-STAT 3, BLOOD GAS (G3P V) -  Abnormal; Notable for the following:    pH, Ven 7.454 (*)    pCO2, Ven 38.8 (*)    pO2, Ven 168.0 (*)    Bicarbonate 27.4 (*)    Acid-Base Excess 3.0 (*)    All other components within normal limits  GLUCOSE, CAPILLARY - Abnormal; Notable for the following:    Glucose-Capillary 562 (*)    All other components within normal limits  GLUCOSE, CAPILLARY - Abnormal; Notable for the following:    Glucose-Capillary 478 (*)    All other components within normal limits  GLUCOSE, CAPILLARY - Abnormal; Notable for the following:    Glucose-Capillary 431 (*)    All other components within normal limits  DIFFERENTIAL  URINE MICROSCOPIC-ADD ON   Dg Chest 2 View  01/17/2012  *RADIOLOGY REPORT*  Clinical Data: Hyperglycemia.  CHEST - 2 VIEW  Comparison: 05/05/2009.  Findings: The cardiac silhouette, mediastinal and hilar contours are within normal limits and stable. The lungs are clear.  No pleural effusions.  The bony thorax is intact  IMPRESSION: Normal chest x-ray.  Original Report Authenticated By: P. Loralie Champagne, M.D.     1. Hyperglycemia   2. Back pain     3:31 PM Handoff from [employer]. Patient on hyperglycemia protocol. Glucose >  600, no DKA. Plan: fluids and insulin until blood sugar 200-300.   Patient seen and examined. He is comfortable except for chronic back pain. Tramadol given.   Vital signs reviewed and are as follows: Filed Vitals:   01/17/12 1414  BP: 127/82  Pulse: 83  Temp: 97.6 F (36.4 C)  Resp: 20   3:33 PM Exam:  Gen NAD; Heart RRR, nml S1,S2, no m/r/g; Lungs CTAB; Abd soft, NT, no rebound or guarding; Ext 2+ pedal pulses bilaterally, no edema.  4:40 PM CBG 244. Will have patient eat and discharge if sugar remains controlled.   7:21 PM CBG's in 200's after eating. Will d/c to home. Social work at bedside trying to facilitate transport home.   Patient urged to followup with his primary care clinic for evaluation and management of his insulin regimen.  Patient counseled on diet. Counseled return with persistently elevated blood sugars, nausea vomiting, severe abdominal pain, other concerns. Verbalizes understanding and agrees with plan.   MDM  Hyperglycemia, improved. No signs of DKA. Patient stable for discharge home. Urged primary care followup.        Renne Crigler, [place] 01/17/12 1931

## 2012-01-17 NOTE — ED Provider Notes (Signed)
Medical screening examination/treatment/procedure(s) were conducted as a shared visit with non-physician practitioner(s) and myself.  I personally evaluated the patient during the encounter  Patient relates history of compliance with his diabetic medication but do to living in a homeless shelter his diet has not been appropriate. He has had no recent changes in his diabetic medication. He will begin IV fluids here and insulin. He follows up at the clinic and was told to go there to have his insulin adjusted.  Toy Baker, MD 01/17/12 [phone]

## 2012-01-17 NOTE — ED Notes (Signed)
Pt reports difficulty controlling blood sugars at home. Pt reports taking lantus and having insulin regimens switched several times by MD. Pt reports generalized body aches/pain, nausea, diarrhea x 2 days. Reports increased thirst and urination.

## 2012-01-17 NOTE — ED Notes (Signed)
Pt states understanding of discharge instructions 

## 2012-01-17 NOTE — ED Provider Notes (Signed)
History     CSN: [id number]  Arrival date & time 01/17/12  0934   None     Chief Complaint  Patient presents with  . Hyperglycemia    (Consider location/radiation/quality/duration/timing/severity/associated sxs/prior treatment) HPI Comments: Pt states he is feeling achy all over, having nausea, headache, cough, congestion. Blood sugar read "high" on glucometer today. States has diabetes, takes lantus twice a day. Denies missing a dose. Yesterday blood sugars in 300s. Stats his everyday average is about 300-500. States [facility] an [facility] in [place], and he has said that his blood sugars are high, but states they would not adjust his medications. States he is having a lot of nausea, denies vomiting, having excessive thirst, frequent urination.   Patient is a 36 y.o. male presenting with weakness. The history is provided by the patient.  Weakness The primary symptoms include headaches, dizziness and nausea. Primary symptoms do not include fever or vomiting. The symptoms began 2 to 6 hours ago.  Dizziness also occurs with nausea. Dizziness does not occur with vomiting.     Past Medical History  Diagnosis Date  . Diabetes mellitus     Past Surgical History  Procedure Date  . Cholecystectomy     History reviewed. No pertinent family history.  History  Substance Use Topics  . Smoking status: Current Everyday Smoker -- 0.5 packs/day    Types: Cigarettes  . Smokeless tobacco: Not on file  . Alcohol Use: No      Review of Systems  Constitutional: Positive for fatigue. Negative for fever and chills.  HENT: Positive for congestion.   Eyes: Negative.   Respiratory: Positive for cough. Negative for chest tightness.   Cardiovascular: Negative.   Gastrointestinal: Positive for nausea. Negative for vomiting, abdominal pain and diarrhea.  Genitourinary: Negative.   Musculoskeletal: Positive for myalgias.  Skin: Negative.   Neurological: Positive for dizziness and  headaches.  Psychiatric/Behavioral: Negative.     Allergies  Review of patient's allergies indicates no known allergies.  Home Medications   Current Outpatient Rx  Name Route Sig Dispense Refill  . INSULIN GLARGINE 100 UNIT/ML [place] SOLN Subcutaneous Inject 40 Units into the skin 2 (two) times daily.    . TESTOSTERONE 50 MG/5GM TD GEL Transdermal Place 5 g onto the skin daily.      BP 120/87  Pulse 94  Temp(Src) 97.8 F (36.6 C) (Oral)  Resp 19  SpO2 95%  Physical Exam  Nursing note and vitals reviewed. Constitutional: He is oriented to person, place, and time. He appears well-developed and well-nourished. No distress.  Eyes: Conjunctivae are normal.  Neck: Neck supple.  Cardiovascular: Normal rate, regular rhythm and normal heart sounds.   Pulmonary/Chest: Effort normal and breath sounds normal. No respiratory distress. He has no wheezes.  Abdominal: Soft. Bowel sounds are normal. He exhibits no distension. There is no tenderness.  Musculoskeletal: Normal range of motion.  Neurological: He is alert and oriented to person, place, and time.  Skin: Skin is warm and dry.  Psychiatric: He has a normal mood and affect.    ED Course  Procedures (including critical care time)  Labs Reviewed  GLUCOSE, CAPILLARY - Abnormal; Notable for the following:    Glucose-Capillary >600 (*)    All other components within normal limits  CBC  DIFFERENTIAL  COMPREHENSIVE METABOLIC PANEL  URINALYSIS, ROUTINE W REFLEX MICROSCOPIC  BLOOD GAS, VENOUS   Pt's glucose 562 after fluids. He is not in DKA. Will place in CDU on hyperglycemia  protocol. Discussed with Dr. Burna Forts with the plan.  No diagnosis found.    MDM          Lottie Mussel, PA 01/20/12 0020

## 2012-01-17 NOTE — ED Notes (Signed)
cbg is 562

## 2012-01-17 NOTE — ED Notes (Signed)
Service response called in reference to missing meal ordered at 1545.

## 2012-01-17 NOTE — ED Notes (Signed)
Pt called out to nurses station c/o midsternal chest pain. Tatyana PA made aware and EKG ordered. Pain non-radiating, reports nausea with pain.

## 2012-01-17 NOTE — Discharge Instructions (Signed)
Please read and follow all provided instructions.  Your diagnoses today include:  1. Hyperglycemia   2. Back pain     Tests performed today include:  Blood tests that show high blood sugar  Vital signs. See below for your results today.   Medications prescribed:   None   Home care instructions:  Follow any educational materials contained in this packet.  Follow-up instructions: Please follow-up with your primary care provider in the next 3 days for further evaluation of your symptoms and to review your insulin regimen.  If you do not have a primary care doctor -- see below for referral information.   Return instructions:   Please return to the Emergency Department if you experience worsening symptoms.   Return with fever, vomiting, worsening abdominal pain, or very high blood sugar.  Please return if you have any other emergent concerns.  Additional Information:  Your vital signs today were: BP 127/82  Pulse 83  Temp(Src) 97.6 F (36.4 C) (Oral)  Resp 20  SpO2 98% If your blood pressure (BP) was elevated above 135/85 this visit, please have this repeated by your doctor within one month. -------------- No Primary Care Doctor Call [facility]  [phone] Other agencies that provide inexpensive medical care    [name]  [phone]    [facility]  [phone]    [facility]  [phone]    [facility]  [phone]    [facility]  [phone]    [facility]  [phone] -------------- RESOURCE GUIDE:  Dental Problems  Patients with Medicaid: [facility] [phone] [address]                                            [phone] [address] Phone:  [phone]                                                   Phone:  [phone]  If unable to pay or uninsured, contact:  [facility] or [facility] to become qualified for the adult dental  clinic.  Chronic Pain Problems Contact [facility]  [phone] Patients need to be referred by their primary care doctor.  Insufficient Money for Medicine Contact [employer]:  call "211" or [facility] [phone].  Psychological Services [facility]  [phone] [facility]  [phone] [facility]   [phone] (emergency services [phone])  Substance Abuse [facility]  [phone] [facility] [phone] [facility] [phone] Floydene Flock [phone] Residential & Outpatient Substance Abuse Program  [phone]  Abuse/Neglect [facility] Child Abuse Hotline 7182175338 [facility] Child Abuse Hotline [phone] (After Hours)  Emergency Shelter [facility] [phone]  [facility] [phone] [facility] [phone]  [facility] [place]  [facility] [address] [place]                       [address]      [address]  [place]                                                [place]                            [place] Phone:  [phone]                                   Phone:  [phone]                 Phone:  [phone]  [place] [facility] Phone:  [phone]  [place] Child Abuse Hotline (336) 342-1394 [phone]) [phone] (After Hours)    

## 2012-01-18 NOTE — ED Notes (Signed)
Cab voucher given to pt.  CSW also provided pt with emotional support and discussed his diabetes and application for disability.

## 2012-01-19 NOTE — ED Provider Notes (Signed)
Medical screening examination/treatment/procedure(s) were conducted as a shared visit with non-physician practitioner(s) and myself.  I personally evaluated the patient during the encounter  Toy Baker, MD 01/19/12 1549

## 2012-01-23 ENCOUNTER — Inpatient Hospital Stay: Payer: Self-pay | Admitting: Internal Medicine

## 2012-01-23 LAB — COMPREHENSIVE METABOLIC PANEL
Albumin: 3.7 g/dL (ref 3.4–5.0)
Alkaline Phosphatase: 121 U/L (ref 50–136)
Bilirubin,Total: 0.6 mg/dL (ref 0.2–1.0)
Calcium, Total: 9.2 mg/dL (ref 8.5–10.1)
Chloride: 84 mmol/L — ABNORMAL LOW (ref 98–107)
Co2: 26 mmol/L (ref 21–32)
Creatinine: 1.33 mg/dL — ABNORMAL HIGH (ref 0.60–1.30)
EGFR (African American): >60
EGFR (Non-African Amer.): >60
Glucose: 1047 mg/dL (ref 65–99)
Potassium: 4.8 mmol/L (ref 3.5–5.1)
SGPT (ALT): 17 U/L
Sodium: 122 mmol/L — ABNORMAL LOW (ref 136–145)
Total Protein: 8.2 g/dL (ref 6.4–8.2)

## 2012-01-23 LAB — URINALYSIS, COMPLETE
Bilirubin,UR: NEGATIVE
Blood: NEGATIVE
Glucose,UR: >=500 mg/dL (ref 0–75)
Ph: 6 (ref 4.5–8.0)
Protein: NEGATIVE
Specific Gravity: 1.023 (ref 1.003–1.030)
WBC UR: NONE SEEN /HPF (ref 0–5)

## 2012-01-23 LAB — HEMOGLOBIN A1C: Hemoglobin A1C: 15.8 % — ABNORMAL HIGH (ref 4.2–6.3)

## 2012-01-23 LAB — PROTIME-INR
INR: 1
Prothrombin Time: 13.7 secs (ref 11.5–14.7)

## 2012-01-23 LAB — CBC
MCH: 25.8 pg — ABNORMAL LOW (ref 26.0–34.0)
MCHC: 32.3 g/dL (ref 32.0–36.0)
Platelet: 145 10*3/uL — ABNORMAL LOW (ref 150–440)
RBC: 5.21 10*6/uL (ref 4.40–5.90)
RDW: 13.4 % (ref 11.5–14.5)
WBC: 9.4 10*3/uL (ref 3.8–10.6)

## 2012-01-23 LAB — APTT: Activated PTT: 28.6 secs (ref 23.6–35.9)

## 2012-01-23 LAB — MAGNESIUM: Magnesium: 2 mg/dL

## 2012-01-23 NOTE — ED Provider Notes (Signed)
Medical screening examination/treatment/procedure(s) were performed by non-physician practitioner and as supervising physician I was immediately available for consultation/collaboration.  Toy Baker, MD 01/23/12 [phone]

## 2012-01-24 LAB — COMPREHENSIVE METABOLIC PANEL
Albumin: 2.9 g/dL — ABNORMAL LOW (ref 3.4–5.0)
Calcium, Total: 8 mg/dL — ABNORMAL LOW (ref 8.5–10.1)
Chloride: 106 mmol/L (ref 98–107)
Co2: 24 mmol/L (ref 21–32)
EGFR (African American): >60
EGFR (Non-African Amer.): >60
Glucose: 135 mg/dL — ABNORMAL HIGH (ref 65–99)
Osmolality: 286 (ref 275–301)
Potassium: 3.3 mmol/L — ABNORMAL LOW (ref 3.5–5.1)
SGOT(AST): 18 U/L (ref 15–37)
SGPT (ALT): 20 U/L
Sodium: 142 mmol/L (ref 136–145)
Total Protein: 6.5 g/dL (ref 6.4–8.2)

## 2012-01-24 LAB — CBC WITH DIFFERENTIAL/PLATELET
Basophil #: 0.1 10*3/uL (ref 0.0–0.1)
Basophil %: 0.9 %
Eosinophil #: 0.4 10*3/uL (ref 0.0–0.7)
Eosinophil %: 4.5 %
HCT: 36 % — ABNORMAL LOW (ref 40.0–52.0)
Lymphocyte %: 30.1 %
MCH: 25.1 pg — ABNORMAL LOW (ref 26.0–34.0)
Monocyte #: 0.4 x10 3/mm (ref 0.2–1.0)
Monocyte %: 5.6 %
Neutrophil #: 4.6 10*3/uL (ref 1.4–6.5)
Neutrophil %: 58.9 %
Platelet: 122 10*3/uL — ABNORMAL LOW (ref 150–440)
RBC: 4.8 10*6/uL (ref 4.40–5.90)
WBC: 7.9 10*3/uL (ref 3.8–10.6)

## 2012-01-24 LAB — TROPONIN I
Troponin-I: <0.02 ng/mL
Troponin-I: <0.02 ng/mL

## 2012-01-25 LAB — BASIC METABOLIC PANEL
Anion Gap: 8 (ref 7–16)
Co2: 27 mmol/L (ref 21–32)
EGFR (African American): >60
EGFR (Non-African Amer.): >60
Osmolality: 279 (ref 275–301)
Potassium: 3.3 mmol/L — ABNORMAL LOW (ref 3.5–5.1)
Sodium: 141 mmol/L (ref 136–145)

## 2012-02-07 ENCOUNTER — Ambulatory Visit: Payer: Self-pay | Admitting: Internal Medicine

## 2012-02-20 ENCOUNTER — Inpatient Hospital Stay: Payer: Self-pay | Admitting: Internal Medicine

## 2012-02-20 LAB — URINALYSIS, COMPLETE
Bacteria: NONE SEEN
Bilirubin,UR: NEGATIVE
Blood: NEGATIVE
Glucose,UR: >=500 mg/dL (ref 0–75)
Leukocyte Esterase: NEGATIVE
Nitrite: NEGATIVE
Protein: NEGATIVE
Specific Gravity: 1.023 (ref 1.003–1.030)
WBC UR: NONE SEEN /HPF (ref 0–5)

## 2012-02-20 LAB — CBC
HCT: 42.7 % (ref 40.0–52.0)
HGB: 13.8 g/dL (ref 13.0–18.0)
MCH: 26.3 pg (ref 26.0–34.0)
MCHC: 32.4 g/dL (ref 32.0–36.0)
Platelet: 115 10*3/uL — ABNORMAL LOW (ref 150–440)
RBC: 5.26 10*6/uL (ref 4.40–5.90)
RDW: 14.4 % (ref 11.5–14.5)

## 2012-02-20 LAB — COMPREHENSIVE METABOLIC PANEL
Albumin: 3.5 g/dL (ref 3.4–5.0)
Anion Gap: 17 — ABNORMAL HIGH (ref 7–16)
BUN: 20 mg/dL — ABNORMAL HIGH (ref 7–18)
Calcium, Total: 8.8 mg/dL (ref 8.5–10.1)
Chloride: 90 mmol/L — ABNORMAL LOW (ref 98–107)
Co2: 19 mmol/L — ABNORMAL LOW (ref 21–32)
Creatinine: 1.16 mg/dL (ref 0.60–1.30)
EGFR (African American): >60
Glucose: 884 mg/dL (ref 65–99)
Osmolality: 300 (ref 275–301)
Potassium: 4.1 mmol/L (ref 3.5–5.1)
SGOT(AST): 15 U/L (ref 15–37)
Total Protein: 7.8 g/dL (ref 6.4–8.2)

## 2012-02-20 LAB — DRUG SCREEN, URINE
Amphetamines, Ur Screen: NEGATIVE (ref ?–1000)
Barbiturates, Ur Screen: NEGATIVE (ref ?–200)
Benzodiazepine, Ur Scrn: NEGATIVE (ref ?–200)
Cocaine Metabolite,Ur ~~LOC~~: NEGATIVE (ref ?–300)
MDMA (Ecstasy)Ur Screen: NEGATIVE (ref ?–500)
Opiate, Ur Screen: NEGATIVE (ref ?–300)
Phencyclidine (PCP) Ur S: NEGATIVE (ref ?–25)
Tricyclic, Ur Screen: NEGATIVE (ref ?–1000)

## 2012-02-20 LAB — TSH: Thyroid Stimulating Horm: 0.75 u[IU]/mL

## 2012-02-20 LAB — LIPID PANEL: Triglycerides: 625 mg/dL — ABNORMAL HIGH (ref 0–200)

## 2012-02-20 LAB — BASIC METABOLIC PANEL
BUN: 14 mg/dL (ref 7–18)
Calcium, Total: 8.6 mg/dL (ref 8.5–10.1)
Chloride: 107 mmol/L (ref 98–107)
Co2: 24 mmol/L (ref 21–32)
Creatinine: 0.87 mg/dL (ref 0.60–1.30)
EGFR (African American): >60
Potassium: 3.5 mmol/L (ref 3.5–5.1)

## 2012-02-21 LAB — BASIC METABOLIC PANEL
Anion Gap: 10 (ref 7–16)
BUN: 12 mg/dL (ref 7–18)
Calcium, Total: 8.4 mg/dL — ABNORMAL LOW (ref 8.5–10.1)
Chloride: 101 mmol/L (ref 98–107)
Creatinine: 0.81 mg/dL (ref 0.60–1.30)
EGFR (African American): >60
EGFR (Non-African Amer.): >60
Glucose: 383 mg/dL — ABNORMAL HIGH (ref 65–99)
Osmolality: 286 (ref 275–301)
Potassium: 3.6 mmol/L (ref 3.5–5.1)

## 2012-02-21 LAB — CBC WITH DIFFERENTIAL/PLATELET
Basophil %: 1 %
Basophil: 1 %
Comment - H1-Com1: NORMAL
Eosinophil %: 4.1 %
HCT: 36.7 % — ABNORMAL LOW (ref 40.0–52.0)
HGB: 12.4 g/dL — ABNORMAL LOW (ref 13.0–18.0)
Lymphocyte #: 2.6 10*3/uL (ref 1.0–3.6)
Lymphocyte %: 35 %
MCHC: 33.8 g/dL (ref 32.0–36.0)
MCV: 76 fL — ABNORMAL LOW (ref 80–100)
Neutrophil #: 4.1 10*3/uL (ref 1.4–6.5)
Platelet: 105 10*3/uL — ABNORMAL LOW (ref 150–440)
RBC: 4.8 10*6/uL (ref 4.40–5.90)
Segmented Neutrophils: 50 %

## 2012-02-21 LAB — IRON AND TIBC
Iron Bind.Cap.(Total): 203 ug/dL — ABNORMAL LOW (ref 250–450)
Iron Saturation: 29 %
Unbound Iron-Bind.Cap.: 145 ug/dL

## 2012-02-21 LAB — APTT: Activated PTT: 29.3 secs (ref 23.6–35.9)

## 2012-02-21 LAB — PROTIME-INR
INR: 1
Prothrombin Time: 13.2 secs (ref 11.5–14.7)

## 2012-02-21 LAB — MAGNESIUM: Magnesium: 1.5 mg/dL — ABNORMAL LOW

## 2012-02-22 LAB — CBC WITH DIFFERENTIAL/PLATELET
Basophil #: 0.1 10*3/uL (ref 0.0–0.1)
HCT: 38.7 % — ABNORMAL LOW (ref 40.0–52.0)
HGB: 12.9 g/dL — ABNORMAL LOW (ref 13.0–18.0)
Lymphocyte %: 33.7 %
MCH: 25.7 pg — ABNORMAL LOW (ref 26.0–34.0)
MCHC: 33.4 g/dL (ref 32.0–36.0)
MCV: 77 fL — ABNORMAL LOW (ref 80–100)
Monocyte #: 0.6 x10 3/mm (ref 0.2–1.0)
Monocyte %: 7.5 %
Neutrophil %: 53.8 %
RDW: 14.7 % — ABNORMAL HIGH (ref 11.5–14.5)

## 2012-02-22 LAB — BASIC METABOLIC PANEL
BUN: 10 mg/dL (ref 7–18)
Chloride: 105 mmol/L (ref 98–107)
Co2: 28 mmol/L (ref 21–32)
Creatinine: 0.7 mg/dL (ref 0.60–1.30)
EGFR (African American): >60
EGFR (Non-African Amer.): >60
Glucose: 115 mg/dL — ABNORMAL HIGH (ref 65–99)
Potassium: 3.4 mmol/L — ABNORMAL LOW (ref 3.5–5.1)
Sodium: 140 mmol/L (ref 136–145)

## 2012-02-22 LAB — MAGNESIUM: Magnesium: 1.7 mg/dL — ABNORMAL LOW

## 2012-02-23 LAB — MAGNESIUM: Magnesium: 1.6 mg/dL — ABNORMAL LOW

## 2012-02-23 LAB — POTASSIUM: Potassium: 3.9 mmol/L (ref 3.5–5.1)

## 2012-02-26 ENCOUNTER — Emergency Department (HOSPITAL_COMMUNITY)
Admission: EM | Admit: 2012-02-26 | Discharge: 2012-02-26 | Disposition: A | Discharge status: Home / Self Care | Payer: Self-pay | Attending: Emergency Medicine | Admitting: Emergency Medicine

## 2012-02-26 DIAGNOSIS — M542 Cervicalgia: Secondary | ICD-10-CM | POA: Insufficient documentation

## 2012-02-26 DIAGNOSIS — M549 Dorsalgia, unspecified: Secondary | ICD-10-CM | POA: Insufficient documentation

## 2012-02-26 DIAGNOSIS — IMO0001 Reserved for inherently not codable concepts without codable children: Secondary | ICD-10-CM | POA: Insufficient documentation

## 2012-02-26 DIAGNOSIS — M791 Myalgia, unspecified site: Secondary | ICD-10-CM

## 2012-02-26 DIAGNOSIS — Z794 Long term (current) use of insulin: Secondary | ICD-10-CM | POA: Insufficient documentation

## 2012-02-26 DIAGNOSIS — M79609 Pain in unspecified limb: Secondary | ICD-10-CM | POA: Insufficient documentation

## 2012-02-26 DIAGNOSIS — E119 Type 2 diabetes mellitus without complications: Secondary | ICD-10-CM | POA: Insufficient documentation

## 2012-02-26 DIAGNOSIS — M25519 Pain in unspecified shoulder: Secondary | ICD-10-CM | POA: Insufficient documentation

## 2012-02-26 LAB — URINALYSIS, ROUTINE W REFLEX MICROSCOPIC
Bilirubin Urine: NEGATIVE
Glucose, UA: >1000 mg/dL — AB
Hgb urine dipstick: NEGATIVE
Specific Gravity, Urine: 1.027 (ref 1.005–1.030)
pH: 6.5 (ref 5.0–8.0)

## 2012-02-26 LAB — POCT I-STAT 3, VENOUS BLOOD GAS (G3P V)
Acid-Base Excess: 6 mmol/L — ABNORMAL HIGH (ref 0.0–2.0)
Bicarbonate: 30.6 meq/L — ABNORMAL HIGH (ref 20.0–24.0)
O2 Saturation: 99 %
TCO2: 32 mmol/L (ref 0–100)
pCO2, Ven: 41.8 mmHg — ABNORMAL LOW (ref 45.0–50.0)
pH, Ven: 7.473 — ABNORMAL HIGH (ref 7.250–7.300)
pO2, Ven: 110 mmHg — ABNORMAL HIGH (ref 30.0–45.0)

## 2012-02-26 LAB — COMPREHENSIVE METABOLIC PANEL
ALT: 29 U/L (ref 0–53)
Alkaline Phosphatase: 97 U/L (ref 39–117)
BUN: 12 mg/dL (ref 6–23)
Chloride: 95 mEq/L — ABNORMAL LOW (ref 96–112)
GFR calc Af Amer: >90 mL/min (ref >90)
Glucose, Bld: 403 mg/dL — ABNORMAL HIGH (ref 70–99)
Potassium: 5 mEq/L (ref 3.5–5.1)
Sodium: 134 mEq/L — ABNORMAL LOW (ref 135–145)
Total Bilirubin: 0.3 mg/dL (ref 0.3–1.2)

## 2012-02-26 LAB — CBC
HCT: 34.9 % — ABNORMAL LOW (ref 39.0–52.0)
Hemoglobin: 11.9 g/dL — ABNORMAL LOW (ref 13.0–17.0)
MCV: 75.5 fL — ABNORMAL LOW (ref 78.0–100.0)
RBC: 4.62 MIL/uL (ref 4.22–5.81)
WBC: 8.5 10*3/uL (ref 4.0–10.5)

## 2012-02-26 LAB — URINE MICROSCOPIC-ADD ON

## 2012-02-26 MED ORDER — MORPHINE SULFATE 4 MG/ML IJ SOLN
4.0000 mg | Freq: Once | INTRAMUSCULAR | Status: AC
  Administered 2012-02-26: 4 mg via INTRAVENOUS
  Filled 2012-02-26: qty 1

## 2012-02-26 MED ORDER — HYDROCODONE-ACETAMINOPHEN 5-325 MG PO TABS: 1.0000 | ORAL_TABLET | Freq: Four times a day (QID) | ORAL | Status: DC | PRN

## 2012-02-26 MED ORDER — SODIUM CHLORIDE 0.9 % IV BOLUS (SEPSIS)
1000.0000 mL | Freq: Once | INTRAVENOUS | Status: AC
  Administered 2012-02-26: 1000 mL via INTRAVENOUS

## 2012-02-26 MED ORDER — GABAPENTIN 100 MG PO CAPS: 100.0000 mg | ORAL_CAPSULE | Freq: Three times a day (TID) | ORAL | Status: DC

## 2012-02-26 MED ORDER — ONDANSETRON HCL 4 MG/2ML IJ SOLN
4.0000 mg | Freq: Once | INTRAMUSCULAR | Status: AC
  Administered 2012-02-26: 4 mg via INTRAVENOUS
  Filled 2012-02-26: qty 2

## 2012-02-26 NOTE — Discharge Instructions (Signed)

## 2012-02-26 NOTE — ED Provider Notes (Signed)
History     CSN: [id number]  Arrival date & time 02/26/12  1191   First MD Initiated Contact with Patient 02/26/12 1021      Chief Complaint  Patient presents with  . Back Pain  . Leg Pain  . Shoulder Pain    (Consider location/radiation/quality/duration/timing/severity/associated sxs/prior treatment) HPI  Patient presents to the ED with complaints of back pain, neck and leg pain. He has diabetes and has been admitted for DKA in the past. He has tried taking tylenol at home for the pain but it has not been helping. He says the pain started 5 years ago, about 2 years ago he took Gabapentin and a unknown pain medication for a year and had relief. The past year he says that he has not had refills of these medications and that his extremity pains have been getting worse. He denies weakness of fatigue. He denies headaches, blurry vision, back pain, or abdominal pain.  Past Medical History  Diagnosis Date  . Diabetes mellitus     Past Surgical History  Procedure Date  . Cholecystectomy     No family history on file.  History  Substance Use Topics  . Smoking status: Current Everyday Smoker -- 0.5 packs/day    Types: Cigarettes  . Smokeless tobacco: Not on file  . Alcohol Use: No      Review of Systems   HEENT: denies blurry vision or change in hearing PULMONARY: Denies difficulty breathing and SOB CARDIAC: denies chest pain or heart palpitations MUSCULOSKELETAL:  denies being unable to ambulate ABDOMEN AL: denies abdominal pain GU: denies loss of bowel or urinary control NEURO: denies numbness and tingling in extremities   Allergies  Review of patient's allergies indicates no known allergies.  Home Medications   Current Outpatient Rx  Name Route Sig Dispense Refill  . CITALOPRAM HYDROBROMIDE 40 MG PO TABS Oral Take 40 mg by mouth daily.    . INSULIN ISOPHANE & REGULAR (70-30) 100 UNIT/ML [place] SUSP Subcutaneous Inject 50 Units into the skin 2 (two) times daily.      Marland Kitchen METFORMIN HCL 500 MG PO TABS Oral Take 500 mg by mouth 2 (two) times daily with a meal.    . TESTOSTERONE 50 MG/5GM TD GEL Transdermal Place 5 g onto the skin daily.    Marland Kitchen GABAPENTIN 100 MG PO CAPS Oral Take 1 capsule (100 mg total) by mouth 3 (three) times daily. 90 capsule 0  . HYDROCODONE-ACETAMINOPHEN 5-325 MG PO TABS Oral Take 1 tablet by mouth every 6 (six) hours as needed for pain. 15 tablet 0    BP 140/79  Pulse 68  Temp(Src) 98 F (36.7 C) (Oral)  Resp 15  SpO2 100%  Physical Exam  Nursing note and vitals reviewed. Constitutional: He appears well-developed and well-nourished. No distress.  HENT:  Head: Normocephalic and atraumatic.  Eyes: Pupils are equal, round, and reactive to light.  Neck: Normal range of motion. Neck supple.  Cardiovascular: Normal rate and regular rhythm.   Pulmonary/Chest: Effort normal.  Abdominal: Soft.  Musculoskeletal:       No physical findings on exam. His bilateral feet have no lesions on them and have adequate neurosensory function.  Neurological: He is alert.  Skin: Skin is warm and dry.    ED Course  Procedures (including critical care time)  Labs Reviewed  GLUCOSE, CAPILLARY - Abnormal; Notable for the following:    Glucose-Capillary 462 (*)    All other components within normal limits  CBC -  Abnormal; Notable for the following:    Hemoglobin 11.9 (*)    HCT 34.9 (*)    MCV 75.5 (*)    MCH 25.8 (*)    Platelets 108 (*) PLATELET COUNT CONFIRMED BY SMEAR   All other components within normal limits  URINALYSIS, ROUTINE W REFLEX MICROSCOPIC - Abnormal; Notable for the following:    Glucose, UA >1000 (*)    All other components within normal limits  COMPREHENSIVE METABOLIC PANEL - Abnormal; Notable for the following:    Sodium 134 (*)    Chloride 95 (*)    Glucose, Bld 403 (*)    Albumin 3.2 (*)    All other components within normal limits  POCT I-STAT 3, BLOOD GAS (G3P V) - Abnormal; Notable for the following:    pH, Ven  7.473 (*)    pCO2, Ven 41.8 (*)    pO2, Ven 110.0 (*)    Bicarbonate 30.6 (*)    Acid-Base Excess 6.0 (*)    All other components within normal limits  URINE MICROSCOPIC-ADD ON   No results found.   1. Myalgia       MDM  I have reviewed patient with Dr. Oletta Lamas, he agrees with my plan to start pt back on Neurontin.  His cbg is elevated but this is baseline for this patient. He is not in DKA.   Pt is awaiting medicare currently and does not have close follow-up with his PCP. I have advised the patient to return to the ED.  Pt has been advised of the symptoms that warrant their return to the ED. Patient has voiced understanding and has agreed to follow-up with the PCP or specialist.         Dorthula Matas, PA 02/26/12 1347

## 2012-02-26 NOTE — ED Notes (Signed)
Pt has been having high blood sugar for 2 days accompanied by back pain, neck and shoulder pain, and leg pain. Pt complains of difficulty walking. Pt has taken tylenol at home with no relief. Pt states that he has been taking his insulin and metformin.

## 2012-02-26 NOTE — ED Notes (Signed)
Notified RN of CBG 462

## 2012-02-27 ENCOUNTER — Ambulatory Visit: Payer: Self-pay | Admitting: Internal Medicine

## 2012-02-27 LAB — CBC CANCER CENTER
Basophil #: 0.1 x10 3/mm (ref 0.0–0.1)
Basophil %: 1 %
Eosinophil #: 0.3 x10 3/mm (ref 0.0–0.7)
Eosinophil %: 4.2 %
Eosinophil: 4 %
HCT: 36.5 % — ABNORMAL LOW (ref 40.0–52.0)
HGB: 11.8 g/dL — ABNORMAL LOW (ref 13.0–18.0)
Lymphocyte #: 1.8 x10 3/mm (ref 1.0–3.6)
Lymphocyte %: 22.5 %
Lymphocytes: 27 %
MCH: 25.5 pg — ABNORMAL LOW (ref 26.0–34.0)
MCV: 79 fL — ABNORMAL LOW (ref 80–100)
Monocyte #: 0.7 x10 3/mm (ref 0.2–1.0)
Monocyte %: 9 %
Monocytes: 7 %
Neutrophil #: 5.1 x10 3/mm (ref 1.4–6.5)
Platelet: 129 x10 3/mm — ABNORMAL LOW (ref 150–440)
RBC: 4.63 10*6/uL (ref 4.40–5.90)
Segmented Neutrophils: 61 %
WBC: 8.1 x10 3/mm (ref 3.8–10.6)

## 2012-02-27 LAB — RETICULOCYTES: Reticulocyte: 1.74 % — ABNORMAL HIGH (ref 0.5–1.5)

## 2012-02-28 NOTE — ED Provider Notes (Signed)
Medical screening examination/treatment/procedure(s) were performed by non-physician practitioner and as supervising physician I was immediately available for consultation/collaboration.   Gavin Pound. Serenity Fortner, MD 02/28/12 1555

## 2012-02-29 ENCOUNTER — Encounter (HOSPITAL_COMMUNITY): Payer: Self-pay

## 2012-02-29 ENCOUNTER — Emergency Department (HOSPITAL_COMMUNITY)
Admission: EM | Admit: 2012-02-29 | Discharge: 2012-03-01 | Disposition: A | Discharge status: Home / Self Care | Payer: Self-pay | Attending: Emergency Medicine | Admitting: Emergency Medicine

## 2012-02-29 DIAGNOSIS — E119 Type 2 diabetes mellitus without complications: Secondary | ICD-10-CM | POA: Insufficient documentation

## 2012-02-29 DIAGNOSIS — F4310 Post-traumatic stress disorder, unspecified: Secondary | ICD-10-CM | POA: Insufficient documentation

## 2012-02-29 DIAGNOSIS — M545 Low back pain, unspecified: Secondary | ICD-10-CM | POA: Insufficient documentation

## 2012-02-29 DIAGNOSIS — F17200 Nicotine dependence, unspecified, uncomplicated: Secondary | ICD-10-CM | POA: Insufficient documentation

## 2012-02-29 DIAGNOSIS — G8929 Other chronic pain: Secondary | ICD-10-CM | POA: Insufficient documentation

## 2012-02-29 DIAGNOSIS — Z794 Long term (current) use of insulin: Secondary | ICD-10-CM | POA: Insufficient documentation

## 2012-02-29 DIAGNOSIS — K5090 Crohn's disease, unspecified, without complications: Secondary | ICD-10-CM | POA: Insufficient documentation

## 2012-02-29 DIAGNOSIS — Z79899 Other long term (current) drug therapy: Secondary | ICD-10-CM | POA: Insufficient documentation

## 2012-02-29 HISTORY — DX: Crohn's disease, unspecified, without complications: K50.90

## 2012-02-29 HISTORY — DX: Post-traumatic stress disorder, unspecified: F43.10

## 2012-02-29 MED ORDER — METHOCARBAMOL 500 MG PO TABS
1000.0000 mg | ORAL_TABLET | Freq: Once | ORAL | Status: AC
  Administered 2012-02-29: 1000 mg via ORAL
  Filled 2012-02-29: qty 1

## 2012-02-29 MED ORDER — KETOROLAC TROMETHAMINE 60 MG/2ML IM SOLN
60.0000 mg | Freq: Once | INTRAMUSCULAR | Status: AC
  Administered 2012-02-29: 60 mg via INTRAMUSCULAR
  Filled 2012-02-29: qty 2

## 2012-02-29 NOTE — ED Notes (Signed)
Patient c/o having increasingly worse pain in back and hips since having a bone marrow test on Monday. Patient also reports increased weakness and slight chest pain that he rates 6/10.

## 2012-02-29 NOTE — ED Notes (Signed)
Pt reports pain in right leg no associated with any injury. Denies other concerns

## 2012-03-01 LAB — GLUCOSE, CAPILLARY: Glucose-Capillary: 109 mg/dL — ABNORMAL HIGH (ref 70–99)

## 2012-03-01 MED ORDER — BUPIVACAINE-EPINEPHRINE PF 0.5-1:200000 % IJ SOLN
INTRAMUSCULAR | Status: AC
  Filled 2012-03-01: qty 1.8

## 2012-03-01 MED ORDER — TRAMADOL HCL 50 MG PO TABS: 50.0000 mg | ORAL_TABLET | Freq: Four times a day (QID) | ORAL | Status: AC | PRN

## 2012-03-01 MED ORDER — METHOCARBAMOL 500 MG PO TABS: 500.0000 mg | ORAL_TABLET | Freq: Two times a day (BID) | ORAL | Status: DC

## 2012-03-01 MED ORDER — IBUPROFEN 600 MG PO TABS: 600.0000 mg | ORAL_TABLET | Freq: Four times a day (QID) | ORAL | Status: DC | PRN

## 2012-03-01 NOTE — ED Provider Notes (Signed)
History     CSN: [id number]  Arrival date & time 02/29/12  1750   First MD Initiated Contact with Patient 02/29/12 2303      Chief Complaint  Patient presents with  . Leg Pain  . Back Pain    (Consider location/radiation/quality/duration/timing/severity/associated sxs/prior treatment) HPI Pt represent for persistent low back pain radiating to R leg. Pain has been present for several years. Was seen in the ED several days ago and given neurotin rx. Pt did not fill the rx stating it was too expensive. No incontinence, fever, chills, weakness, saddle anesthesia or new changes to presentation. Pt is asking for pain meds.  Past Medical History  Diagnosis Date  . Diabetes mellitus   . Crohn disease   . PTSD (post-traumatic stress disorder)     Past Surgical History  Procedure Date  . Cholecystectomy   . Orchiectomy   . Foot surgery     Family History  Problem Relation Age of Onset  . Diabetes Mother   . Diabetes Father     History  Substance Use Topics  . Smoking status: Current Everyday Smoker -- 0.5 packs/day    Types: Cigarettes  . Smokeless tobacco: Not on file  . Alcohol Use: No      Review of Systems  Constitutional: Negative for fever and chills.  Gastrointestinal: Negative for nausea, vomiting and abdominal pain.  Genitourinary: Negative for flank pain and difficulty urinating.  Musculoskeletal: Positive for back pain. Negative for myalgias and arthralgias.  Skin: Negative for rash and wound.  Neurological: Negative for weakness, numbness and headaches.    Allergies  Acetaminophen and Codeine  Home Medications   Current Outpatient Rx  Name Route Sig Dispense Refill  . CITALOPRAM HYDROBROMIDE 40 MG PO TABS Oral Take 40 mg by mouth daily.    . INSULIN ISOPHANE & REGULAR (70-30) 100 UNIT/ML [place] SUSP Subcutaneous Inject 50 Units into the skin 2 (two) times daily.    Marland Kitchen METFORMIN HCL 500 MG PO TABS Oral Take 500 mg by mouth 2 (two) times daily with a meal.     . TESTOSTERONE 50 MG/5GM TD GEL Transdermal Place 5 g onto the skin daily.    . IBUPROFEN 600 MG PO TABS Oral Take 1 tablet (600 mg total) by mouth every 6 (six) hours as needed for pain. 30 tablet 0  . METHOCARBAMOL 500 MG PO TABS Oral Take 1 tablet (500 mg total) by mouth 2 (two) times daily. 20 tablet 0  . TRAMADOL HCL 50 MG PO TABS Oral Take 1 tablet (50 mg total) by mouth every 6 (six) hours as needed for pain. 15 tablet 0    BP 119/81  Pulse 70  Temp(Src) 98 F (36.7 C) (Oral)  Resp 18  SpO2 100%  Physical Exam  Nursing note and vitals reviewed. Constitutional: He is oriented to person, place, and time. He appears well-developed and well-nourished. No distress.  HENT:  Head: Normocephalic and atraumatic.  Mouth/Throat: Oropharynx is clear and moist.  Eyes: EOM are normal. Pupils are equal, round, and reactive to light.  Neck: Normal range of motion. Neck supple.  Cardiovascular: Normal rate and regular rhythm.   Pulmonary/Chest: Effort normal and breath sounds normal. No respiratory distress. He has no wheezes. He has no rales.  Abdominal: Soft. Bowel sounds are normal. There is no tenderness. There is no rebound and no guarding.  Musculoskeletal: Normal range of motion. He exhibits tenderness. He exhibits no edema.       Pt  has lumbar midline surgical scars from prev back surgeries and is ttp over this area. No evidence of trauma.   Neurological: He is alert and oriented to person, place, and time.       5/5 motor, sensation intact, no saddle anesthesia  Skin: Skin is warm and dry. No rash noted. No erythema.  Psychiatric: He has a normal mood and affect. His behavior is normal.    ED Course  Procedures (including critical care time)  Labs Reviewed  GLUCOSE, CAPILLARY - Abnormal; Notable for the following:    Glucose-Capillary 109 (*)    All other components within normal limits   No results found.   1. Chronic low back pain       MDM  Treat symptomatically  and f/u with PMD        Loren Racer, MD 03/01/12 [phone]

## 2012-03-01 NOTE — ED Notes (Signed)
Pt verbalizes understanding.  Nadn.  Pt amb without difficulty

## 2012-03-01 NOTE — Discharge Instructions (Signed)
Chronic Back Pain When back pain lasts longer than 3 months, it is called chronic back pain.This pain can be frustrating, but the cause of the pain is rarely dangerous.People with chronic back pain often go through certain periods that are more intense (flare-ups). CAUSES Chronic back pain can be caused by wear and tear (degeneration) on different structures in your back. These structures may include bones, ligaments, or discs. This degeneration may result in more pressure being placed on the nerves that travel to your legs and feet. This can lead to pain traveling from the low back down the back of the legs. When pain lasts longer than 3 months, it is not unusual for people to experience anxiety or depression. Anxiety and depression can also contribute to low back pain. TREATMENT  Establish a regular exercise plan. This is critical to improving your functional level.   Have a self-management plan for when you flare-up. Flare-ups rarely require a medical visit. Regular exercise will help reduce the intensity and frequency of your flare-ups.   Manage how you feel about your back pain and the rest of your life. Anxiety, depression, and feeling that you cannot alter your back pain have been shown to make back pain more intense and debilitating.   Medicines should never be your only treatment. They should be used along with other treatments to help you return to a more active lifestyle.   Procedures such as injections or surgery may be helpful but are rarely necessary. You may be able to get the same results with physical therapy or chiropractic care.  HOME CARE INSTRUCTIONS  Avoid bending, heavy lifting, prolonged sitting, and activities which make the problem worse.   Continue normal activity as much as possible.   Take brief periods of rest throughout the day to reduce your pain during flare-ups.   Follow your back exercise rehabilitation program. This can help reduce symptoms and prevent  more pain.   Only take over-the-counter or prescription medicines as directed by your caregiver. Muscle relaxants are sometimes prescribed. Narcotic pain medicine is discouraged for long-term pain, since addiction is a possible outcome.   If you smoke, quit.   Eat healthy foods and maintain a recommended body weight.  SEEK IMMEDIATE MEDICAL CARE IF:   You have weakness or numbness in one of your legs or feet.   You have trouble controlling your bladder or bowels.   You develop nausea, vomiting, abdominal pain, shortness of breath, or fainting.  Document Released: 11/02/2004 Document Revised: 09/14/2011 Document Reviewed: 09/09/2011 [employer] Patient Information 2012 [employer]. 

## 2012-03-07 ENCOUNTER — Emergency Department (HOSPITAL_COMMUNITY)
Admission: EM | Admit: 2012-03-07 | Discharge: 2012-03-07 | Disposition: A | Discharge status: Home / Self Care | Payer: Self-pay | Attending: Emergency Medicine | Admitting: Emergency Medicine

## 2012-03-07 ENCOUNTER — Encounter (HOSPITAL_COMMUNITY): Payer: Self-pay | Admitting: *Deleted

## 2012-03-07 DIAGNOSIS — F17200 Nicotine dependence, unspecified, uncomplicated: Secondary | ICD-10-CM | POA: Insufficient documentation

## 2012-03-07 DIAGNOSIS — Z794 Long term (current) use of insulin: Secondary | ICD-10-CM | POA: Insufficient documentation

## 2012-03-07 DIAGNOSIS — G8929 Other chronic pain: Secondary | ICD-10-CM | POA: Insufficient documentation

## 2012-03-07 DIAGNOSIS — R42 Dizziness and giddiness: Secondary | ICD-10-CM | POA: Insufficient documentation

## 2012-03-07 DIAGNOSIS — E1169 Type 2 diabetes mellitus with other specified complication: Secondary | ICD-10-CM | POA: Insufficient documentation

## 2012-03-07 DIAGNOSIS — K5090 Crohn's disease, unspecified, without complications: Secondary | ICD-10-CM | POA: Insufficient documentation

## 2012-03-07 DIAGNOSIS — M79609 Pain in unspecified limb: Secondary | ICD-10-CM | POA: Insufficient documentation

## 2012-03-07 DIAGNOSIS — R739 Hyperglycemia, unspecified: Secondary | ICD-10-CM

## 2012-03-07 DIAGNOSIS — R109 Unspecified abdominal pain: Secondary | ICD-10-CM | POA: Insufficient documentation

## 2012-03-07 LAB — COMPREHENSIVE METABOLIC PANEL
AST: 21 U/L (ref 0–37)
Albumin: 3.3 g/dL — ABNORMAL LOW (ref 3.5–5.2)
BUN: 13 mg/dL (ref 6–23)
Creatinine, Ser: 0.69 mg/dL (ref 0.50–1.35)
Total Protein: 6.9 g/dL (ref 6.0–8.3)

## 2012-03-07 LAB — URINALYSIS, ROUTINE W REFLEX MICROSCOPIC
Bilirubin Urine: NEGATIVE
Ketones, ur: NEGATIVE mg/dL
Leukocytes, UA: NEGATIVE
Nitrite: NEGATIVE
Specific Gravity, Urine: 1.024 (ref 1.005–1.030)
Urobilinogen, UA: 0.2 mg/dL (ref 0.0–1.0)

## 2012-03-07 LAB — DIFFERENTIAL
Basophils Absolute: 0 10*3/uL (ref 0.0–0.1)
Basophils Relative: 0 % (ref 0–1)
Eosinophils Relative: 3 % (ref 0–5)
Lymphocytes Relative: 22 % (ref 12–46)
Monocytes Absolute: 0.5 10*3/uL (ref 0.1–1.0)
Neutro Abs: 6.4 10*3/uL (ref 1.7–7.7)

## 2012-03-07 LAB — GLUCOSE, CAPILLARY
Glucose-Capillary: 434 mg/dL — ABNORMAL HIGH (ref 70–99)
Glucose-Capillary: 246 mg/dL — ABNORMAL HIGH (ref 70–99)

## 2012-03-07 LAB — CBC
HCT: 33.6 % — ABNORMAL LOW (ref 39.0–52.0)
MCHC: 34.2 g/dL (ref 30.0–36.0)
Platelets: 162 10*3/uL (ref 150–400)
RDW: 13.6 % (ref 11.5–15.5)
WBC: 9.2 10*3/uL (ref 4.0–10.5)

## 2012-03-07 LAB — URINE MICROSCOPIC-ADD ON

## 2012-03-07 MED ORDER — SODIUM CHLORIDE 0.9 % IV BOLUS (SEPSIS)
1000.0000 mL | Freq: Once | INTRAVENOUS | Status: AC
  Administered 2012-03-07: 1000 mL via INTRAVENOUS

## 2012-03-07 MED ORDER — INSULIN ASPART 100 UNIT/ML ~~LOC~~ SOLN
SUBCUTANEOUS | Status: AC
  Filled 2012-03-07: qty 1

## 2012-03-07 MED ORDER — INSULIN ASPART 100 UNIT/ML ~~LOC~~ SOLN
10.0000 [IU] | Freq: Once | SUBCUTANEOUS | Status: AC
  Administered 2012-03-07: 10 [IU] via INTRAVENOUS
  Filled 2012-03-07: qty 1

## 2012-03-07 MED ORDER — TRAMADOL HCL 50 MG PO TABS: 50.0000 mg | ORAL_TABLET | Freq: Four times a day (QID) | ORAL | Status: AC | PRN

## 2012-03-07 NOTE — ED Notes (Addendum)
Pt. Reports uncontrolled blood sugars at home.  Pt. Reports numbness and tingling in left arm x 1 day.  Pt. Reports Nausea "a lot". Pt. Reports compliance with medications 50u of metformin x 2 days.

## 2012-03-07 NOTE — Discharge Instructions (Signed)
Increase your insulin to 55 units twice a day. Talk with your doctor about being referred to an endocrinologist because of how resistant your diabetes has been. Also, talk with your doctor about being referred to a pain management clinic.  Hyperglycemia Hyperglycemia occurs when the glucose (sugar) in your blood is too high. Hyperglycemia can happen for many reasons, but it most often happens to people who do not know they have diabetes or are not managing their diabetes properly.  CAUSES  Whether you have diabetes or not, there are other causes of hyperglycemia. Hyperglycemia can occur when you have diabetes, but it can also occur in other situations that you might not be as aware of, such as: Diabetes  If you have diabetes and are having problems controlling your blood glucose, hyperglycemia could occur because of some of the following reasons:   Not following your meal plan.   Not taking your diabetes medications or not taking it properly.   Exercising less or doing less activity than you normally do.   Being sick.  Pre-diabetes  This cannot be ignored. Before people develop Type 2 diabetes, they almost always have "pre-diabetes." This is when your blood glucose levels are higher than normal, but not yet high enough to be diagnosed as diabetes. Research has shown that some long-term damage to the body, especially the heart and circulatory system, may already be occurring during pre-diabetes. If you take action to manage your blood glucose when you have pre-diabetes, you may delay or prevent Type 2 diabetes from developing.  Stress  If you have diabetes, you may be "diet" controlled or on oral medications or insulin to control your diabetes. However, you may find that your blood glucose is higher than usual in the hospital whether you have diabetes or not. This is often referred to as "stress hyperglycemia." Stress can elevate your blood glucose. This happens because of hormones put out by  the body during times of stress. If stress has been the cause of your high blood glucose, it can be followed regularly by your caregiver. That way he/she can make sure your hyperglycemia does not continue to get worse or progress to diabetes.  Steroids  Steroids are medications that act on the infection fighting system (immune system) to block inflammation or infection. One side effect can be a rise in blood glucose. Most people can produce enough extra insulin to allow for this rise, but for those who cannot, steroids make blood glucose levels go even higher. It is not unusual for steroid treatments to "uncover" diabetes that is developing. It is not always possible to determine if the hyperglycemia will go away after the steroids are stopped. A special blood test called an A1c is sometimes done to determine if your blood glucose was elevated before the steroids were started.  SYMPTOMS  Thirsty.   Frequent urination.   Dry mouth.   Blurred vision.   Tired or fatigue.   Weakness.   Sleepy.   Tingling in feet or leg.  DIAGNOSIS  Diagnosis is made by monitoring blood glucose in one or all of the following ways:  A1c test. This is a chemical found in your blood.   Fingerstick blood glucose monitoring.   Laboratory results.  TREATMENT  First, knowing the cause of the hyperglycemia is important before the hyperglycemia can be treated. Treatment may include, but is not be limited to:  Education.   Change or adjustment in medications.   Change or adjustment in meal plan.  Treatment for an illness, infection, etc.   More frequent blood glucose monitoring.   Change in exercise plan.   Decreasing or stopping steroids.   Lifestyle changes.  HOME CARE INSTRUCTIONS   Test your blood glucose as directed.   Exercise regularly. Your caregiver will give you instructions about exercise. Pre-diabetes or diabetes which comes on with stress is helped by exercising.   Eat wholesome,  balanced meals. Eat often and at regular, fixed times. Your caregiver or nutritionist will give you a meal plan to guide your sugar intake.   Being at an ideal weight is important. If needed, losing as little as 10 to 15 pounds may help improve blood glucose levels.  SEEK MEDICAL CARE IF:   You have questions about medicine, activity, or diet.   You continue to have symptoms (problems such as increased thirst, urination, or weight gain).  SEEK IMMEDIATE MEDICAL CARE IF:   You are vomiting or have diarrhea.   Your breath smells fruity.   You are breathing faster or slower.   You are very sleepy or incoherent.   You have numbness, tingling, or pain in your feet or hands.   You have chest pain.   Your symptoms get worse even though you have been following your caregiver's orders.   If you have any other questions or concerns.  Document Released: 03/21/2001 Document Revised: 09/14/2011 Document Reviewed: 05/17/2009 [facility] Patient Information 2012 [place], [place].  Tramadol tablets What is this medicine? TRAMADOL (TRA ma dole) is a pain reliever. It is used to treat moderate to severe pain in adults. This medicine may be used for other purposes; ask your health care provider or pharmacist if you have questions. What should I tell my health care provider before I take this medicine? They need to know if you have any of these conditions: -brain tumor -depression -drug abuse or addiction -head injury -if you frequently drink alcohol containing drinks -kidney disease or trouble passing urine -liver disease -lung disease, asthma, or breathing problems -seizures or epilepsy -suicidal thoughts, plans, or attempt; a previous suicide attempt by you or a family member -an unusual or allergic reaction to tramadol, codeine, other medicines, foods, dyes, or preservatives -pregnant or trying to get pregnant -breast-feeding How should I use this medicine? Take this medicine by mouth  with a full glass of water. Follow the directions on the prescription label. If the medicine upsets your stomach, take it with food or milk. Do not take more medicine than you are told to take. Talk to your pediatrician regarding the use of this medicine in children. Special care may be needed. Overdosage: If you think you have taken too much of this medicine contact a poison control center or emergency room at once. NOTE: This medicine is only for you. Do not share this medicine with others. What if I miss a dose? If you miss a dose, take it as soon as you can. If it is almost time for your next dose, take only that dose. Do not take double or extra doses. What may interact with this medicine? Do not take this medicine with any of the following medications: -MAOIs like Carbex, Eldepryl, Marplan, Nardil, and Parnate This medicine may also interact with the following medications: -alcohol or medicines that contain alcohol -antihistamines -benzodiazepines -bupropion -carbamazepine or oxcarbazepine -clozapine -cyclobenzaprine -digoxin -furazolidone -linezolid -medicines for depression, anxiety, or psychotic disturbances -medicines for migraine headache like almotriptan, eletriptan, frovatriptan, naratriptan, rizatriptan, sumatriptan, zolmitriptan -medicines for pain like pentazocine, buprenorphine, butorphanol,  meperidine, nalbuphine, and propoxyphene -medicines for sleep -muscle relaxants -naltrexone -phenobarbital -phenothiazines like perphenazine, thioridazine, chlorpromazine, mesoridazine, fluphenazine, prochlorperazine, promazine, and trifluoperazine -procarbazine -warfarin This list may not describe all possible interactions. Give your health care provider a list of all the medicines, herbs, non-prescription drugs, or dietary supplements you use. Also tell them if you smoke, drink alcohol, or use illegal drugs. Some items may interact with your medicine. What should I watch for  while using this medicine? Tell your doctor or health care professional if your pain does not go away, if it gets worse, or if you have new or a different type of pain. You may develop tolerance to the medicine. Tolerance means that you will need a higher dose of the medicine for pain relief. Tolerance is normal and is expected if you take this medicine for a long time. Do not suddenly stop taking your medicine because you may develop a severe reaction. Your body becomes used to the medicine. This does NOT mean you are addicted. Addiction is a behavior related to getting and using a drug for a non-medical reason. If you have pain, you have a medical reason to take pain medicine. Your doctor will tell you how much medicine to take. If your doctor wants you to stop the medicine, the dose will be slowly lowered over time to avoid any side effects. You may get drowsy or dizzy. Do not drive, use machinery, or do anything that needs mental alertness until you know how this medicine affects you. Do not stand or sit up quickly, especially if you are an older patient. This reduces the risk of dizzy or fainting spells. Alcohol can increase or decrease the effects of this medicine. Avoid alcoholic drinks. You may have constipation. Try to have a bowel movement at least every 2 to 3 days. If you do not have a bowel movement for 3 days, call your doctor or health care professional. Your mouth may get dry. Chewing sugarless gum or sucking hard candy, and drinking plenty of water may help. Contact your doctor if the problem does not go away or is severe. What side effects may I notice from receiving this medicine? Side effects that you should report to your doctor or health care professional as soon as possible: -allergic reactions like skin rash, itching or hives, swelling of the face, lips, or tongue -breathing difficulties, wheezing -confusion -itching -light headedness or fainting spells -redness, blistering,  peeling or loosening of the skin, including inside the mouth -seizures Side effects that usually do not require medical attention (report to your doctor or health care professional if they continue or are bothersome): -constipation -dizziness -drowsiness -headache -nausea, vomiting This list may not describe all possible side effects. Call your doctor for medical advice about side effects. You may report side effects to FDA at 1-800-FDA-1088. Where should I keep my medicine? Keep out of the reach of children. Store at room temperature between 15 and 30 degrees C (59 and 86 degrees F). Keep container tightly closed. Throw away any unused medicine after the expiration date. NOTE: This sheet is a summary. It may not cover all possible information. If you have questions about this medicine, talk to your doctor, pharmacist, or health care provider.  2012, [employer]. (06/08/2010 11:55:44 AM)

## 2012-03-07 NOTE — ED Notes (Signed)
CBG 246 ?

## 2012-03-07 NOTE — ED Notes (Signed)
Pt. D/C home. NAD. A.O. X 4

## 2012-03-07 NOTE — ED Provider Notes (Signed)
History  This chart was scribed for Roberto Booze, MD by Bennett Scrape. This patient was seen in room MCY20/MCY20 and the patient's care was started at 1:49PM.  CSN: [id number]  Arrival date & time 03/07/12  1216   First MD Initiated Contact with Patient 03/07/12 1343      Chief Complaint  Patient presents with  . Hyperglycemia    The history is provided by the patient. No language interpreter was used.    Roberto [name] is a 36 y.o. male with a h/o diabetes, crohn's and PTSD who presents to the Emergency Department complaining of 4 hours of gradual onset, non-changing, constant hyperglycemia with associated left arm pain, dizziness and abdominal pain. Pt reports that he had a CVA in 2006 and reports that the arm pain was similar. Pt states that his CBG stays around 300 to 400 even though he is taking 50 units of insulin twice a day. He states that he is worried that the diabetes will "get the better of me" if he does not get his CBG levels under control. He denies any other symptoms currently. He is a current everyday smoker but denies alcohol use. Pt reports smoking marijuana for "all over body pain" , because he states that he cannot get a prescription for his pain.    Pt has been seen here several times within the past two months for similar symptoms.  Pt is seen at [facility].  Past Medical History  Diagnosis Date  . Diabetes mellitus   . Crohn disease   . PTSD (post-traumatic stress disorder)     Past Surgical History  Procedure Date  . Cholecystectomy   . Orchiectomy   . Foot surgery     Family History  Problem Relation Age of Onset  . Diabetes Mother   . Diabetes Father     History  Substance Use Topics  . Smoking status: Current Everyday Smoker -- 0.5 packs/day    Types: Cigarettes  . Smokeless tobacco: Not on file  . Alcohol Use: No      Review of Systems  Constitutional: Negative for fever and chills.  Respiratory: Negative for shortness of breath.    Gastrointestinal: Positive for abdominal pain. Negative for nausea and vomiting.  Musculoskeletal:       Left arm pain  Neurological: Positive for dizziness. Negative for weakness.    Allergies  Acetaminophen and Codeine  Home Medications   Current Outpatient Rx  Name Route Sig Dispense Refill  . CITALOPRAM HYDROBROMIDE 40 MG PO TABS Oral Take 40 mg by mouth daily.    . INSULIN ISOPHANE & REGULAR (70-30) 100 UNIT/ML [place] SUSP Subcutaneous Inject 50 Units into the skin 2 (two) times daily.    Marland Kitchen METFORMIN HCL 500 MG PO TABS Oral Take 500 mg by mouth 2 (two) times daily with a meal.    . TESTOSTERONE 50 MG/5GM TD GEL Transdermal Place 5 g onto the skin daily.    . TRAMADOL HCL 50 MG PO TABS Oral Take 1 tablet (50 mg total) by mouth every 6 (six) hours as needed for pain. 15 tablet 0  . TRAMADOL HCL 50 MG PO TABS Oral Take 50 mg by mouth every 6 (six) hours as needed. For pain      Triage Vitals: BP 135/114  Pulse 99  Temp(Src) 98.8 F (37.1 C) (Oral)  Resp 16  SpO2 96%  Physical Exam  Nursing note and vitals reviewed. Constitutional: He is oriented to person, place, and time. He  appears well-developed and well-nourished. No distress.  HENT:  Head: Normocephalic and atraumatic.  Eyes: EOM are normal.       funduscopic normal  Neck: Neck supple. No tracheal deviation present.       No carotid bruits  Cardiovascular: Normal rate.   Pulmonary/Chest: Effort normal. No respiratory distress.  Abdominal: Soft. Bowel sounds are normal. He exhibits no mass. There is no tenderness.  Musculoskeletal: Normal range of motion.  Neurological: He is alert and oriented to person, place, and time.  Skin: Skin is warm and dry.  Psychiatric: He has a normal mood and affect. His behavior is normal.    ED Course  Procedures (including critical care time)  DIAGNOSTIC STUDIES: Oxygen Saturation is 96% on room air, adequate by my interpretation.    COORDINATION OF CARE: 1:49PM-Discussed  treatment plan with pt which includes IV fluids and blood work and pt agreed to plan. Advised pt to ask for a referral to an endocrinologist from [facility] to follow up with.   Results for orders placed during the hospital encounter of 03/07/12  GLUCOSE, CAPILLARY      Component Value Range   Glucose-Capillary 434 (*) 70 - 99 (mg/dL)   Comment 1 Notify RN     Comment 2 Documented in Chart    CBC      Component Value Range   WBC 9.2  4.0 - 10.5 (K/uL)   RBC 4.43  4.22 - 5.81 (MIL/uL)   Hemoglobin 11.5 (*) 13.0 - 17.0 (g/dL)   HCT 86.5 (*) 78.4 - 52.0 (%)   MCV 75.8 (*) 78.0 - 100.0 (fL)   MCH 26.0  26.0 - 34.0 (pg)   MCHC 34.2  30.0 - 36.0 (g/dL)   RDW 69.6  29.5 - 28.4 (%)   Platelets 162  150 - 400 (K/uL)  DIFFERENTIAL      Component Value Range   Neutrophils Relative 69  43 - 77 (%)   Neutro Abs 6.4  1.7 - 7.7 (K/uL)   Lymphocytes Relative 22  12 - 46 (%)   Lymphs Abs 2.0  0.7 - 4.0 (K/uL)   Monocytes Relative 6  3 - 12 (%)   Monocytes Absolute 0.5  0.1 - 1.0 (K/uL)   Eosinophils Relative 3  0 - 5 (%)   Eosinophils Absolute 0.3  0.0 - 0.7 (K/uL)   Basophils Relative 0  0 - 1 (%)   Basophils Absolute 0.0  0.0 - 0.1 (K/uL)  URINALYSIS, ROUTINE W REFLEX MICROSCOPIC      Component Value Range   Color, Urine YELLOW  YELLOW    APPearance CLEAR  CLEAR    Specific Gravity, Urine 1.024  1.005 - 1.030    pH 5.5  5.0 - 8.0    Glucose, UA >1000 (*) NEGATIVE (mg/dL)   Hgb urine dipstick NEGATIVE  NEGATIVE    Bilirubin Urine NEGATIVE  NEGATIVE    Ketones, ur NEGATIVE  NEGATIVE (mg/dL)   Protein, ur NEGATIVE  NEGATIVE (mg/dL)   Urobilinogen, UA 0.2  0.0 - 1.0 (mg/dL)   Nitrite NEGATIVE  NEGATIVE    Leukocytes, UA NEGATIVE  NEGATIVE   COMPREHENSIVE METABOLIC PANEL      Component Value Range   Sodium 135  135 - 145 (mEq/L)   Potassium 4.3  3.5 - 5.1 (mEq/L)   Chloride 97  96 - 112 (mEq/L)   CO2 28  19 - 32 (mEq/L)   Glucose, Bld 421 (*) 70 - 99 (mg/dL)   BUN  13  6 - 23  (mg/dL)   Creatinine, Ser 1.30  0.50 - 1.35 (mg/dL)   Calcium 9.2  8.4 - 86.5 (mg/dL)   Total Protein 6.9  6.0 - 8.3 (g/dL)   Albumin 3.3 (*) 3.5 - 5.2 (g/dL)   AST 21  0 - 37 (U/L)   ALT 14  0 - 53 (U/L)   Alkaline Phosphatase 81  39 - 117 (U/L)   Total Bilirubin 0.2 (*) 0.3 - 1.2 (mg/dL)   GFR calc non Af Amer >90  >90 (mL/min)   GFR calc Af Amer >90  >90 (mL/min)  URINE MICROSCOPIC-ADD ON      Component Value Range   Squamous Epithelial / LPF RARE  RARE   GLUCOSE, CAPILLARY      Component Value Range   Glucose-Capillary 246 (*) 70 - 99 (mg/dL)     1. Hyperglycemia   2. Chronic pain       MDM  Hyperglycemia in spite of rather large doses of insulin. This is treated with IV fluids and IV insulin and sugars come down. I discussed with the patient that he probably needs evaluation by an endocrinologist in light of his significant insulin resistance. In the meantime, he is to increase his insulin dose to see if this gives better control. His laboratory workup is unremarkable and there is no indication for any advanced imaging.      I personally performed the services described in this documentation, which was scribed in my presence. The recorded information has been reviewed and considered.      Roberto Booze, MD 03/09/12 [phone]

## 2012-03-07 NOTE — ED Notes (Signed)
Pt reports being here to have cbg checked, felt like it was high due to onset at 0930 of frequent urination. Has been taking meds as prescribed.

## 2012-03-09 ENCOUNTER — Ambulatory Visit: Payer: Self-pay | Admitting: Internal Medicine

## 2012-03-20 ENCOUNTER — Encounter (HOSPITAL_COMMUNITY): Payer: Self-pay | Admitting: Emergency Medicine

## 2012-03-20 ENCOUNTER — Observation Stay (HOSPITAL_COMMUNITY)
Admission: EM | Admit: 2012-03-20 | Discharge: 2012-03-20 | Disposition: A | Discharge status: Home / Self Care | Payer: Self-pay | Attending: Emergency Medicine | Admitting: Emergency Medicine

## 2012-03-20 ENCOUNTER — Emergency Department (HOSPITAL_COMMUNITY): Payer: Self-pay

## 2012-03-20 DIAGNOSIS — K5090 Crohn's disease, unspecified, without complications: Secondary | ICD-10-CM | POA: Insufficient documentation

## 2012-03-20 DIAGNOSIS — E785 Hyperlipidemia, unspecified: Secondary | ICD-10-CM | POA: Insufficient documentation

## 2012-03-20 DIAGNOSIS — E119 Type 2 diabetes mellitus without complications: Secondary | ICD-10-CM | POA: Insufficient documentation

## 2012-03-20 DIAGNOSIS — M549 Dorsalgia, unspecified: Secondary | ICD-10-CM | POA: Insufficient documentation

## 2012-03-20 DIAGNOSIS — F4310 Post-traumatic stress disorder, unspecified: Secondary | ICD-10-CM | POA: Insufficient documentation

## 2012-03-20 DIAGNOSIS — F17200 Nicotine dependence, unspecified, uncomplicated: Secondary | ICD-10-CM | POA: Insufficient documentation

## 2012-03-20 DIAGNOSIS — R739 Hyperglycemia, unspecified: Secondary | ICD-10-CM

## 2012-03-20 DIAGNOSIS — G8929 Other chronic pain: Principal | ICD-10-CM | POA: Insufficient documentation

## 2012-03-20 LAB — COMPREHENSIVE METABOLIC PANEL
Alkaline Phosphatase: 100 U/L (ref 39–117)
BUN: 16 mg/dL (ref 6–23)
Chloride: 91 mEq/L — ABNORMAL LOW (ref 96–112)
Creatinine, Ser: 0.68 mg/dL (ref 0.50–1.35)
GFR calc Af Amer: >90 mL/min (ref >90)
Glucose, Bld: 624 mg/dL (ref 70–99)
Potassium: 3.8 mEq/L (ref 3.5–5.1)
Total Bilirubin: 0.2 mg/dL — ABNORMAL LOW (ref 0.3–1.2)
Total Protein: 7.1 g/dL (ref 6.0–8.3)

## 2012-03-20 LAB — URINALYSIS, ROUTINE W REFLEX MICROSCOPIC
Bilirubin Urine: NEGATIVE
Ketones, ur: NEGATIVE mg/dL
Nitrite: NEGATIVE
Urobilinogen, UA: 0.2 mg/dL (ref 0.0–1.0)

## 2012-03-20 LAB — CBC
HCT: 37.8 % — ABNORMAL LOW (ref 39.0–52.0)
Hemoglobin: 13.2 g/dL (ref 13.0–17.0)
MCHC: 34.9 g/dL (ref 30.0–36.0)
MCV: 74.9 fL — ABNORMAL LOW (ref 78.0–100.0)

## 2012-03-20 LAB — DIFFERENTIAL
Basophils Relative: 1 % (ref 0–1)
Eosinophils Absolute: 0.3 10*3/uL (ref 0.0–0.7)
Lymphocytes Relative: 25 % (ref 12–46)
Lymphs Abs: 2.1 10*3/uL (ref 0.7–4.0)
Neutro Abs: 5.1 10*3/uL (ref 1.7–7.7)

## 2012-03-20 LAB — POCT I-STAT 3, VENOUS BLOOD GAS (G3P V)
Acid-Base Excess: 1 mmol/L (ref 0.0–2.0)
Bicarbonate: 26.3 mEq/L — ABNORMAL HIGH (ref 20.0–24.0)
O2 Saturation: 83 %
TCO2: 28 mmol/L (ref 0–100)

## 2012-03-20 LAB — GLUCOSE, CAPILLARY: Glucose-Capillary: >600 mg/dL (ref 70–99)

## 2012-03-20 LAB — KETONES, QUALITATIVE: Acetone, Bld: NEGATIVE

## 2012-03-20 MED ORDER — OXYCODONE-ACETAMINOPHEN 5-325 MG PO TABS
1.0000 | ORAL_TABLET | Freq: Once | ORAL | Status: AC
  Administered 2012-03-20: 1 via ORAL
  Filled 2012-03-20: qty 1

## 2012-03-20 MED ORDER — SODIUM CHLORIDE 0.9 % IV BOLUS (SEPSIS)
1000.0000 mL | Freq: Once | INTRAVENOUS | Status: AC
  Administered 2012-03-20 (×2): 1000 mL via INTRAVENOUS

## 2012-03-20 MED ORDER — SODIUM CHLORIDE 0.9 % IV SOLN
INTRAVENOUS | Status: DC
  Filled 2012-03-20 (×2): qty 1

## 2012-03-20 MED ORDER — MESALAMINE 400 MG PO TBEC: 800.0000 mg | DELAYED_RELEASE_TABLET | Freq: Three times a day (TID) | ORAL | Status: DC

## 2012-03-20 MED ORDER — SODIUM CHLORIDE 0.9 % IV SOLN: 1000.0000 mL | INTRAVENOUS | Status: DC

## 2012-03-20 MED ORDER — TESTOSTERONE 50 MG/5GM (1%) TD GEL: 5.0000 g | Freq: Every day | TRANSDERMAL | Status: DC

## 2012-03-20 MED ORDER — HYDROMORPHONE HCL PF 1 MG/ML IJ SOLN
1.0000 mg | Freq: Once | INTRAMUSCULAR | Status: AC
  Administered 2012-03-20: 1 mg via INTRAVENOUS
  Filled 2012-03-20: qty 1

## 2012-03-20 MED ORDER — KETOROLAC TROMETHAMINE 30 MG/ML IJ SOLN
30.0000 mg | Freq: Once | INTRAMUSCULAR | Status: AC
  Administered 2012-03-20: 30 mg via INTRAVENOUS
  Filled 2012-03-20: qty 1

## 2012-03-20 MED ORDER — INSULIN ASPART PROT & ASPART (70-30 MIX) 100 UNIT/ML ~~LOC~~ SUSP
50.0000 [IU] | Freq: Once | SUBCUTANEOUS | Status: AC
  Administered 2012-03-20: 50 [IU] via SUBCUTANEOUS
  Filled 2012-03-20: qty 10

## 2012-03-20 MED ORDER — OXYCODONE-ACETAMINOPHEN 5-325 MG PO TABS: 1.0000 | ORAL_TABLET | Freq: Four times a day (QID) | ORAL | Status: AC | PRN

## 2012-03-20 MED ORDER — INSULIN ASPART 100 UNIT/ML ~~LOC~~ SOLN
10.0000 [IU] | Freq: Once | SUBCUTANEOUS | Status: AC
  Administered 2012-03-20: 10 [IU] via INTRAVENOUS
  Filled 2012-03-20: qty 1

## 2012-03-20 MED ORDER — ONDANSETRON HCL 4 MG/2ML IJ SOLN
4.0000 mg | Freq: Once | INTRAMUSCULAR | Status: AC
  Administered 2012-03-20: 4 mg via INTRAVENOUS
  Filled 2012-03-20: qty 2

## 2012-03-20 MED ORDER — SODIUM CHLORIDE 0.9 % IV SOLN
1000.0000 mL | Freq: Once | INTRAVENOUS | Status: AC
  Administered 2012-03-20: 1000 mL via INTRAVENOUS

## 2012-03-20 NOTE — ED Notes (Signed)
Pt c/o lower back pain with radiation down right leg x 3 days; pt denies obvious injury

## 2012-03-20 NOTE — ED Notes (Addendum)
Pt seen here for back pain.  While assessing pt, noted CBG was greater than 600, will be treating for this.Pt reports DM Type 2 , currently taking meds insulin 70/30 and metformin. Pt a x 4.

## 2012-03-20 NOTE — ED Notes (Signed)
PA notified of CBG 624

## 2012-03-20 NOTE — ED Provider Notes (Signed)
History   This chart was scribed for Glynn Octave, MD by Charolett Bumpers . The patient was seen in room STRE8/STRE8.    CSN: [id number]  Arrival date & time 03/20/12  1208   First MD Initiated Contact with Patient 03/20/12 1222      Chief Complaint  Patient presents with  . Back Pain    (Consider location/radiation/quality/duration/timing/severity/associated sxs/prior treatment) HPI Comments: Patient denies having any back surgeries. Patient states that he has had similar pain in the same location, but not as bad. Patient also reports a h/o diabetes. Patient states that he has not missed any doses of his medications and states that he ate breakfast this morning. Patient states that his blood sugar was in the 300's when he checked it last.   Patient is a 36 y.o. male presenting with back pain. The history is provided by the patient.  Back Pain  This is a new problem. The current episode started more than 2 days ago. The problem occurs constantly. The problem has been gradually worsening. The pain is associated with no known injury. The pain is present in the lumbar spine. The quality of the pain is described as stabbing. The pain radiates to the right thigh and right knee. The pain is moderate. Pertinent negatives include no fever, no numbness, no bowel incontinence, no bladder incontinence, no tingling and no weakness. He has tried nothing for the symptoms.    Past Medical History  Diagnosis Date  . Diabetes mellitus   . Crohn disease   . PTSD (post-traumatic stress disorder)     Past Surgical History  Procedure Date  . Cholecystectomy   . Orchiectomy   . Foot surgery     Family History  Problem Relation Age of Onset  . Diabetes Mother   . Diabetes Father     History  Substance Use Topics  . Smoking status: Current Everyday Smoker -- 0.5 packs/day    Types: Cigarettes  . Smokeless tobacco: Not on file  . Alcohol Use: No      Review of Systems    Constitutional: Negative for fever and chills.  Respiratory: Negative for shortness of breath.   Gastrointestinal: Negative for nausea, vomiting and bowel incontinence.  Genitourinary: Negative for bladder incontinence.  Musculoskeletal: Positive for back pain.  Neurological: Negative for tingling, weakness and numbness.  All other systems reviewed and are negative.    Allergies  Acetaminophen and Codeine  Home Medications   Current Outpatient Rx  Name Route Sig Dispense Refill  . CITALOPRAM HYDROBROMIDE 40 MG PO TABS Oral Take 40 mg by mouth daily.    . INSULIN ISOPHANE & REGULAR (70-30) 100 UNIT/ML [place] SUSP Subcutaneous Inject 50 Units into the skin 2 (two) times daily.    Marland Kitchen MESALAMINE 400 MG PO TBEC Oral Take 800 mg by mouth 3 (three) times daily.    Marland Kitchen METFORMIN HCL 500 MG PO TABS Oral Take 500 mg by mouth 2 (two) times daily with a meal.    . TESTOSTERONE 50 MG/5GM TD GEL Transdermal Place 5 g onto the skin daily.      BP 134/85  Pulse 102  Temp 98 F (36.7 C) (Oral)  Resp 18  SpO2 96%  Physical Exam  Nursing note and vitals reviewed. Constitutional: He is oriented to person, place, and time. He appears well-developed and well-nourished. No distress.  HENT:  Head: Normocephalic and atraumatic.  Eyes: EOM are normal.  Neck: Neck supple. No tracheal deviation present.  Cardiovascular:  Normal rate.   Pulmonary/Chest: Effort normal. No respiratory distress.  Abdominal: Soft. There is no tenderness.  Musculoskeletal: Normal range of motion.       +2 patellar reflexes. +2 DP and TP pulses. Lumbar midline spinal tenderness. No step offs or deformities.  5/5 strength of lower extremities bilaterally. Great toe extension intact.      Neurological: He is alert and oriented to person, place, and time.  Skin: Skin is warm and dry.  Psychiatric: He has a normal mood and affect. His behavior is normal.    ED Course  Procedures (including critical care time)  DIAGNOSTIC  STUDIES: Oxygen Saturation is 96% on room air, adequate by my interpretation.    COORDINATION OF CARE:  1230: Discussed planned course of treatment with the patient, who is agreeable at this time.     Labs Reviewed - No data to display No results found.   No diagnosis found.    MDM  Acute on chronic lumbar back pain radiating to the right leg. No weakness, numbness, tingling. No bowel or bladder incontinence he. No fever or vomiting.  History of diabetes and DKA. Sugar greater than 600 in triage. IV fluids, insulin, labs, pain control, move to  main ED.   I personally performed the services described in this documentation, which was scribed in my presence.  The recorded information has been reviewed and considered.       Glynn Octave, MD 03/20/12 [phone]

## 2012-03-20 NOTE — ED Provider Notes (Signed)
Pt is a 36 yo M w a hx of T2DM, Crohn's dz, & chronic back paint that has been moved to Pod A for hyperglyemia and back pain pending evalution for possible DKA. Pt denies any recent illness, abdominal pain, nausea or vomiting. He has been evaluated in the Ed for his chronic back pain on May 20th, 23 & 30th being discharged with Ultram that is not relieving back pian. Pt states he is waiting for [facility] to set him up with chronic pain management and hopes to get an appointment in the next couple of weeks. Pt states percocet works on his pain. Denies change in pain, loss control of bowel or bladder, weakness or numbness of extremity, fever, night sweats, chills, IV drug use, HIV, or CA. Pt was previously on steroids, but was taken off bc of hyperglycemia.   Back exam: antalgic gait, pain with motion noted during exam, tenderness noted in lumbar region, negative straight-leg raise bilaterally, normal reflexes and strength bilateral lower extremities, sensory exam intact bilateral lower extremities  2:39 PM  Patient history and plan has been discussed with the CDU midlevel provider who is agreeable with transfer. This patient has also been seen and evaluated by the attending who agrees that the patient will benefit from observation. Patient will be placed on hyperglycemia protocol with re-evaluation pending. Patient stable prior to transfer, has no complaints, and is in NAD.   Plan regarding back pain is to treat while in the ED. Discussed at length with pt that we do not treat chronic pain in the ED. He verbalizes understanding. A short script of percocet can be given at dc at the resuming providers discretion.    Jaci Carrel, [place] 03/20/12 1442

## 2012-03-20 NOTE — ED Provider Notes (Signed)
Assumed care at end of shift. Patient presents with chronic low back pain which radiates to his right leg. He was found to have an elevated blood sugar greater than 600. No anion gap to support DKA. Patient was placed on hyper glycemia protocol. His blood sugar responds well with 10 units of insulin second 2 L of saline fluid. He was initially given Toradol for his back pain which has helped but now patient requests for more pain medication.  On exam, tenderness noted to lumbar midline spine without overlying skin changes, deformity, or swelling. Increasing pain with straight leg raise especially with right leg. Patellar deep tendon reflex 2+ bilaterally. No foot drop. Sensation intact to light touch. Plan to discharge patient with a short course of prednisone. He is to followup with [facility] for further management. He does have diabetic medication at home and states he checked his blood sugar twice daily and is usually under control. Pain management referral given.  Pt also request for refill of his asacol and androgel as he has been out of it for the past week.  I will give medication refill and recommend further care through [facility].  Pt voice understanding.   Fayrene Helper, PA-C 03/20/12 1809  Fayrene Helper, PA-C 03/20/12 1815

## 2012-03-20 NOTE — Discharge Instructions (Signed)
Followup with orthopedics if symptoms continue. Use conservative methods at home including heat therapy and cold therapy as we discussed. More information on cold therapy is listed below.  It is not reccommended to use heat treatment directly after an acute injury.  SEEK IMMEDIATE MEDICAL ATTENTION IF: New numbness, tingling, weakness, or problem with the use of your arms or legs.  Severe back pain not relieved with medications.  Change in bowel or bladder control.  Increasing pain in any areas of the body (such as chest or abdominal pain).  Shortness of breath, dizziness or fainting.  Nausea (feeling sick to your stomach), vomiting, fever, or sweats.  COLD THERAPY DIRECTIONS:  Ice or gel packs can be used to reduce both pain and swelling. Ice is the most helpful within the first 24 to 48 hours after an injury or flareup from overusing a muscle or joint.  Ice is effective, has very few side effects, and is safe for most people to use.   If you expose your skin to cold temperatures for too long or without the proper protection, you can damage your skin or nerves. Watch for signs of skin damage due to cold.   HOME CARE INSTRUCTIONS  Follow these tips to use ice and cold packs safely.  Place a dry or damp towel between the ice and skin. A damp towel will cool the skin more quickly, so you may need to shorten the time that the ice is used.  For a more rapid response, add gentle compression to the ice.  Ice for no more than 10 to 20 minutes at a time. The bonier the area you are icing, the less time it will take to get the benefits of ice.  Check your skin after 5 minutes to make sure there are no signs of a poor response to cold or skin damage.  Rest 20 minutes or more in between uses.  Once your skin is numb, you can end your treatment. You can test numbness by very lightly touching your skin. The touch should be so light that you do not see the skin dimple from the pressure of your fingertip.  When using ice, most people will feel these normal sensations in this order: cold, burning, aching, and numbness.  Do not use ice on someone who cannot communicate their responses to pain, such as small children or people with dementia.   HOW TO MAKE AN ICE PACK  To make an ice pack, do one of the following:  Place crushed ice or a bag of frozen vegetables in a sealable plastic bag. Squeeze out the excess air. Place this bag inside another plastic bag. Slide the bag into a pillowcase or place a damp towel between your skin and the bag.  Mix 3 parts water with 1 part rubbing alcohol. Freeze the mixture in a sealable plastic bag. When you remove the mixture from the freezer, it will be slushy. Squeeze out the excess air. Place this bag inside another plastic bag. Slide the bag into a pillowcase or place a damp towel between your skin and the bag.   SEEK MEDICAL CARE IF:  You develop white spots on your skin. This may give the skin a blotchy (mottled) appearance.  Your skin turns blue or pale.  Your skin becomes waxy or hard.  Your swelling gets worse.  MAKE SURE YOU:  Understand these instructions.  Will watch your condition.  Will get help right away if you are not doing well or   get worse.    Chronic Pain Discharge Instructions  Emergency care providers appreciate that many patients coming to [place] are in severe pain and we wish to address their pain in the safest, most responsible manner.  It is important to recognize however, that the proper treatment of chronic pain differs from that of the pain of injuries and acute illnesses.  Our goal is to provide quality, safe, personalized care and we thank you for giving [place] the opportunity to serve you. The use of narcotics and related agents for chronic pain syndromes may lead to additional physical and psychological problems.  Nearly as many people die from prescription narcotics each year as die from car crashes.  Additionally, this risk is increased if  such prescriptions are obtained from a variety of sources.  Therefore, only your primary care physician or a pain management specialist is able to safely treat such syndromes with narcotic medications long-term.    Documentation revealing such prescriptions have been sought from multiple sources may prohibit [place] from providing a refill or different narcotic medication.  Your name may be checked first through the [facility] Controlled Substances Reporting System.  This database is a record of controlled substance medication prescriptions that the patient has received.  This has been established by [facility] in an effort to eliminate the dangerous, and often life threatening, practice of obtaining multiple prescriptions from different medical providers.   If you have a chronic pain syndrome (i.e. chronic headaches, recurrent back or neck pain, dental pain, abdominal or pelvis pain without a specific diagnosis, or neuropathic pain such as fibromyalgia) or recurrent visits for the same condition without an acute diagnosis, you may be treated with non-narcotics and other non-addictive medicines.  Allergic reactions or negative side effects that may be reported by a patient to such medications will not typically lead to the use of a narcotic analgesic or other controlled substance as an alternative.   Patients managing chronic pain with a personal physician should have provisions in place for breakthrough pain.  If you are in crisis, you should call your physician.  If your physician directs you to the emergency department, please have the doctor call and speak to our attending physician concerning your care.   When patients come to the Emergency Department (ED) with acute medical conditions in which the Emergency Department physician feels appropriate to prescribe narcotic or sedating pain medication, the physician will prescribe these in very limited quantities.  The amount of these medications will last  only until you can see your primary care physician in his/her office.  Any patient who returns to the ED seeking refills should expect only non-narcotic pain medications.   In the event of an acute medical condition exists and the emergency physician feels it is necessary that the patient be given a narcotic or sedating medication -  a responsible adult driver should be present in the room prior to the medication being given by the nurse.   Prescriptions for narcotic or sedating medications that have been lost, stolen or expired will not be refilled in the Emergency Department.    Patients who have chronic pain may receive non-narcotic prescriptions until seen by their primary care physician.  It is every patient's personal responsibility to maintain active prescriptions with his or her primary care physician or specialist.    Chronic Back Pain When back pain lasts longer than 3 months, it is called chronic back pain.This pain can be frustrating, but the cause of the  pain is rarely dangerous.People with chronic back pain often go through certain periods that are more intense (flare-ups). CAUSES Chronic back pain can be caused by wear and tear (degeneration) on different structures in your back. These structures may include bones, ligaments, or discs. This degeneration may result in more pressure being placed on the nerves that travel to your legs and feet. This can lead to pain traveling from the low back down the back of the legs. When pain lasts longer than 3 months, it is not unusual for people to experience anxiety or depression. Anxiety and depression can also contribute to low back pain. TREATMENT  Establish a regular exercise plan. This is critical to improving your functional level.   Have a self-management plan for when you flare-up. Flare-ups rarely require a medical visit. Regular exercise will help reduce the intensity and frequency of your flare-ups.   Manage how you feel about  your back pain and the rest of your life. Anxiety, depression, and feeling that you cannot alter your back pain have been shown to make back pain more intense and debilitating.   Medicines should never be your only treatment. They should be used along with other treatments to help you return to a more active lifestyle.   Procedures such as injections or surgery may be helpful but are rarely necessary. You may be able to get the same results with physical therapy or chiropractic care.  HOME CARE INSTRUCTIONS  Avoid bending, heavy lifting, prolonged sitting, and activities which make the problem worse.   Continue normal activity as much as possible.   Take brief periods of rest throughout the day to reduce your pain during flare-ups.   Follow your back exercise rehabilitation program. This can help reduce symptoms and prevent more pain.   Only take over-the-counter or prescription medicines as directed by your caregiver. Muscle relaxants are sometimes prescribed. Narcotic pain medicine is discouraged for long-term pain, since addiction is a possible outcome.   If you smoke, quit.   Eat healthy foods and maintain a recommended body weight.  SEEK IMMEDIATE MEDICAL CARE IF:   You have weakness or numbness in one of your legs or feet.   You have trouble controlling your bladder or bowels.   You develop nausea, vomiting, abdominal pain, shortness of breath, or fainting.  Document Released: 11/02/2004 Document Revised: 09/14/2011 Document Reviewed: 09/09/2011 [facility] Patient Information 2012 [place], [place].High Blood Sugar High blood sugar (hyperglycemia) means that the level of sugar in your blood is higher than it should be. Signs of high blood sugar include:  Feeling thirsty.   Frequent peeing (urinating).   Feeling tired or sleepy.   Dry mouth.   Vision changes.   Feeling weak.   Feeling hungry but losing weight.   Numbness and tingling in your hands or feet.   Headache.    When you ignore these signs, your blood sugar may keep going up. These problems may get worse, and other problems may begin. HOME CARE  Check your blood sugars as told by your doctor. Write down the numbers with the date and time.   Take the right amount of insulin or diabetes pills at the right time. Write down the dose with date and time.   Refill your insulin or diabetes pills before running out.   Watch what you eat. Follow your meal plan.   Drink liquids without sugar, such as water. Check with your doctor if you have kidney or heart disease.   Follow your  doctor's orders for exercise. Exercise at the same time of day.   Keep your doctor's appointments.  GET HELP RIGHT AWAY IF:   You have trouble thinking or are confused.   You have fast breathing with fruity smelling breath.   You pass out (faint).   You have 2 to 3 days of high blood sugars and you do not know why.   You have chest pain.   You are feeling sick to your stomach (nauseous) or throwing up (vomiting).   You have sudden vision changes.  MAKE SURE YOU:   Understand these instructions.   Will watch your condition.   Will get help right away if you are not doing well or get worse.  Document Released: 07/23/2009 Document Revised: 09/14/2011 Document Reviewed: 07/23/2009 [facility] Patient Information 2012 [place], [place].

## 2012-03-21 MED FILL — Insulin Aspart Prot & Aspart (Human) Inj 100 Unit/ML (70-30): SUBCUTANEOUS | Qty: 0.5 | Status: AC

## 2012-03-21 NOTE — ED Provider Notes (Signed)
Medical screening examination/treatment/procedure(s) were performed by non-physician practitioner and as supervising physician I was immediately available for consultation/collaboration.   Lashundra Shiveley L Brit Carbonell, MD 03/21/12 1453 

## 2012-04-03 ENCOUNTER — Emergency Department (HOSPITAL_COMMUNITY): Payer: Self-pay

## 2012-04-03 ENCOUNTER — Observation Stay (HOSPITAL_COMMUNITY)
Admission: EM | Admit: 2012-04-03 | Discharge: 2012-04-04 | Disposition: A | Discharge status: Home / Self Care | Payer: Self-pay | Attending: Emergency Medicine | Admitting: Emergency Medicine

## 2012-04-03 ENCOUNTER — Encounter (HOSPITAL_COMMUNITY): Payer: Self-pay | Admitting: Physical Medicine and Rehabilitation

## 2012-04-03 DIAGNOSIS — R262 Difficulty in walking, not elsewhere classified: Secondary | ICD-10-CM | POA: Insufficient documentation

## 2012-04-03 DIAGNOSIS — L02412 Cutaneous abscess of left axilla: Secondary | ICD-10-CM

## 2012-04-03 DIAGNOSIS — F17200 Nicotine dependence, unspecified, uncomplicated: Secondary | ICD-10-CM | POA: Insufficient documentation

## 2012-04-03 DIAGNOSIS — M25552 Pain in left hip: Secondary | ICD-10-CM

## 2012-04-03 DIAGNOSIS — M25559 Pain in unspecified hip: Principal | ICD-10-CM | POA: Insufficient documentation

## 2012-04-03 DIAGNOSIS — G8929 Other chronic pain: Secondary | ICD-10-CM | POA: Insufficient documentation

## 2012-04-03 DIAGNOSIS — M549 Dorsalgia, unspecified: Secondary | ICD-10-CM | POA: Insufficient documentation

## 2012-04-03 DIAGNOSIS — IMO0002 Reserved for concepts with insufficient information to code with codable children: Secondary | ICD-10-CM | POA: Insufficient documentation

## 2012-04-03 DIAGNOSIS — R5381 Other malaise: Secondary | ICD-10-CM | POA: Insufficient documentation

## 2012-04-03 DIAGNOSIS — R209 Unspecified disturbances of skin sensation: Secondary | ICD-10-CM | POA: Insufficient documentation

## 2012-04-03 DIAGNOSIS — R739 Hyperglycemia, unspecified: Secondary | ICD-10-CM

## 2012-04-03 DIAGNOSIS — F4310 Post-traumatic stress disorder, unspecified: Secondary | ICD-10-CM | POA: Insufficient documentation

## 2012-04-03 DIAGNOSIS — M459 Ankylosing spondylitis of unspecified sites in spine: Secondary | ICD-10-CM

## 2012-04-03 DIAGNOSIS — M4310 Spondylolisthesis, site unspecified: Secondary | ICD-10-CM

## 2012-04-03 DIAGNOSIS — E119 Type 2 diabetes mellitus without complications: Secondary | ICD-10-CM | POA: Insufficient documentation

## 2012-04-03 LAB — CBC WITH DIFFERENTIAL/PLATELET
Eosinophils Absolute: 0.3 10*3/uL (ref 0.0–0.7)
Eosinophils Relative: 3 % (ref 0–5)
HCT: 33 % — ABNORMAL LOW (ref 39.0–52.0)
Hemoglobin: 11.1 g/dL — ABNORMAL LOW (ref 13.0–17.0)
Lymphocytes Relative: 31 % (ref 12–46)
Lymphs Abs: 2.4 10*3/uL (ref 0.7–4.0)
MCH: 25.7 pg — ABNORMAL LOW (ref 26.0–34.0)
MCV: 76.4 fL — ABNORMAL LOW (ref 78.0–100.0)
Monocytes Relative: 7 % (ref 3–12)
Platelets: 139 10*3/uL — ABNORMAL LOW (ref 150–400)
RBC: 4.32 MIL/uL (ref 4.22–5.81)
WBC: 7.9 10*3/uL (ref 4.0–10.5)

## 2012-04-03 MED ORDER — SODIUM CHLORIDE 0.9 % IV BOLUS (SEPSIS)
1000.0000 mL | Freq: Once | INTRAVENOUS | Status: AC
  Administered 2012-04-03: 1000 mL via INTRAVENOUS

## 2012-04-03 MED ORDER — HYDROMORPHONE HCL PF 1 MG/ML IJ SOLN
1.0000 mg | Freq: Once | INTRAMUSCULAR | Status: AC
  Administered 2012-04-03: 1 mg via INTRAMUSCULAR
  Filled 2012-04-03: qty 1

## 2012-04-03 NOTE — ED Notes (Signed)
Pt presents to department for evaluation of L hip pain and abscess to L axillary area. Pt states hip pain started today, denies injury. States abscess under L arm ongoing for several days. 8/10 pain at the time. Ambulatory to triage. Pt is alert and oriented x4.

## 2012-04-03 NOTE — ED Provider Notes (Signed)
History     CSN: [id number]  Arrival date & time 04/03/12  2150   First MD Initiated Contact with Patient 04/03/12 2211      Chief Complaint  Patient presents with  . Hip Pain  . Abscess    (Consider location/radiation/quality/duration/timing/severity/associated sxs/prior treatment) HPI Comments: Patient with a history of chronic lower back pain who has been to the ER several times recently for this presents today with complaints of left hip pain and numbness to his left foot - he reports these symptoms are new - reports pain worse than his usually lower back pain with radiation down his left leg - reports is able to ambulate but limps because of the pain.  Denies fever, chills, nausea, vomiting, reports subjective weakness but denies loss of control of bowels or bladder, urinary retention.  Has an appointment to follow up with [facility] but has not seen them yet - Is also here with abscess to his left axilla - states has been there about 4 days, gradually gotten worse - denies fever, chills or drainage from the area.  Has history of same but last one was 2 years ago - Is also here with high blood sugar - states that he checked his sugar at home and it was reading "high" on his meter, sates that he took 65 units of his 70/30 prior to arrival and he wants to see if it is down.  Has a history of DKA in the past, but denies abdominal pain, nausea or vomiting.  Patient is a 36 y.o. male presenting with hip pain and abscess. The history is provided by the patient. No language interpreter was used.  Hip Pain This is a new problem. The current episode started today. The problem occurs constantly. The problem has been unchanged. Associated symptoms include arthralgias, myalgias, numbness and weakness. Pertinent negatives include no abdominal pain, anorexia, change in bowel habit, chest pain, chills, congestion, coughing, diaphoresis, fatigue, fever, headaches, joint swelling, nausea, neck pain, rash,  sore throat, swollen glands, urinary symptoms, vertigo, visual change or vomiting. The symptoms are aggravated by bending. He has tried nothing for the symptoms. The treatment provided no relief.  Abscess  This is a new problem. The current episode started less than one week ago. The onset was gradual. The problem occurs occasionally. The problem has been unchanged. The abscess is present on the left arm. The problem is moderate. The abscess is characterized by redness and painfulness. The patient was exposed to OTC medications. The abscess first occurred at home. Associated symptoms include decreased sleep. Pertinent negatives include no anorexia, no decrease in physical activity, not drinking less, no fever, no fussiness, not sleeping more, no diarrhea, no vomiting, no congestion, no rhinorrhea, no sore throat, no decreased responsiveness and no cough. There were no sick contacts. He has received no recent medical care.    Past Medical History  Diagnosis Date  . Diabetes mellitus   . Crohn disease   . PTSD (post-traumatic stress disorder)     Past Surgical History  Procedure Date  . Cholecystectomy   . Orchiectomy   . Foot surgery     Family History  Problem Relation Age of Onset  . Diabetes Mother   . Diabetes Father     History  Substance Use Topics  . Smoking status: Current Everyday Smoker -- 0.5 packs/day    Types: Cigarettes  . Smokeless tobacco: Not on file  . Alcohol Use: No      Review of  Systems  Constitutional: Negative for fever, chills, diaphoresis, decreased responsiveness and fatigue.  HENT: Negative for congestion, sore throat, rhinorrhea and neck pain.   Respiratory: Negative for cough.   Cardiovascular: Negative for chest pain.  Gastrointestinal: Negative for nausea, vomiting, abdominal pain, diarrhea, anorexia and change in bowel habit.  Genitourinary: Negative for dysuria.  Musculoskeletal: Positive for myalgias, back pain, arthralgias and gait problem.  Negative for joint swelling.  Skin: Negative for rash.  Neurological: Positive for weakness and numbness. Negative for vertigo and headaches.  All other systems reviewed and are negative.    Allergies  Acetaminophen and Codeine  Home Medications   Current Outpatient Rx  Name Route Sig Dispense Refill  . CITALOPRAM HYDROBROMIDE 40 MG PO TABS Oral Take 40 mg by mouth daily.    . INSULIN ISOPHANE & REGULAR (70-30) 100 UNIT/ML [place] SUSP Subcutaneous Inject 65 Units into the skin 2 (two) times daily.     Marland Kitchen MESALAMINE 400 MG PO TBEC Oral Take 800 mg by mouth 3 (three) times daily.    Marland Kitchen METFORMIN HCL 500 MG PO TABS Oral Take 500 mg by mouth 2 (two) times daily with a meal.    . TESTOSTERONE 50 MG/5GM TD GEL Transdermal Place 5 g onto the skin daily.      BP 135/83  Pulse 86  Temp 98.7 F (37.1 C) (Oral)  Resp 18  SpO2 98%  Physical Exam  Nursing note and vitals reviewed. Constitutional: He is oriented to person, place, and time. He appears well-developed and well-nourished. No distress.  HENT:  Head: Normocephalic and atraumatic.  Right Ear: External ear normal.  Left Ear: External ear normal.  Nose: Nose normal.  Mouth/Throat: Oropharynx is clear and moist. No oropharyngeal exudate.  Eyes: Conjunctivae are normal. Pupils are equal, round, and reactive to light. No scleral icterus.  Neck: Normal range of motion. Neck supple.  Cardiovascular: Normal rate, regular rhythm and normal heart sounds.  Exam reveals no gallop and no friction rub.   No murmur heard. Pulmonary/Chest: Effort normal and breath sounds normal. No respiratory distress. He has no wheezes. He has no rales. He exhibits no tenderness.  Abdominal: Soft. Bowel sounds are normal. He exhibits no distension. There is no tenderness.  Musculoskeletal:       Left hip: He exhibits decreased range of motion, tenderness and bony tenderness. He exhibits normal strength and no deformity.       Lumbar back: He exhibits decreased  range of motion, tenderness, bony tenderness and pain. He exhibits no swelling, no deformity and normal pulse.  Lymphadenopathy:    He has no cervical adenopathy.  Neurological: He is alert and oriented to person, place, and time. He has normal strength and normal reflexes. He displays no atrophy and no tremor. A sensory deficit is present. No cranial nerve deficit. He exhibits normal muscle tone. He displays a negative Romberg sign. Gait abnormal. Coordination normal. GCS eye subscore is 4. GCS verbal subscore is 5. GCS motor subscore is 6.       Subjective numbness to left foot - reflexes normal - limping gait, no ataxia  Skin: Skin is warm and dry. No rash noted. No erythema. No pallor.  Psychiatric: He has a normal mood and affect. His behavior is normal. Judgment and thought content normal.    ED Course  Procedures (including critical care time)  Labs Reviewed  GLUCOSE, CAPILLARY - Abnormal; Notable for the following:    Glucose-Capillary 512 (*)     All  other components within normal limits  CBC WITH DIFFERENTIAL   Dg Hip Complete Left  04/03/2012  *RADIOLOGY REPORT*  Clinical Data: Acute onset left hip pain.  No known injury.  LEFT HIP - COMPLETE 2+ VIEW  Comparison: None.  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  The sacroiliac joints are poorly demonstrated and may represent sacral ileitis mild degenerative changes in the hips.  The left hip appears intact.  No evidence of acute fracture or subluxation.  No focal bone lesion or bone destruction.  Bone cortex and trabecular architecture appear intact.  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 Degenerative changes in the hips.  No acute fracture or subluxation.  No focal bone destruction.  Original Report Authenticated By: Marlon Pel, M.D.   INCISION AND DRAINAGE Performed by: Cherrie Distance C. Consent: Verbal consent obtained. Risks and benefits:  risks, benefits and alternatives were discussed Type: abscess  Body area: left axilla  Anesthesia: local infiltration  Local anesthetic: lidocaine 2% without epinephrine  Anesthetic total: 3 ml  Complexity: complex Blunt dissection to break up loculations  Drainage: purulent  Drainage amount: large  Packing material: 1/4 in iodoform gauze  Patient tolerance: Patient tolerated the procedure well with no immediate complications.    Chronic lower back pain Left Axillary abscess hyperglycemia   MDM  Patient here with worsening of chronic lower back pain now with reported numbness to left foot - also with worsening left hip pain with numbness and weakness in the left leg.  Also with poorly controlled DM who is here with hyperglycemia without DKA as well as the left axiallary abscess.  Plan to get MRI of lumbar spine in the morning - blood sugars have been trending down through the night.  Has been given pain control through the night.        Izola Price [place], [place] 04/04/12 [phone]

## 2012-04-03 NOTE — ED Notes (Signed)
Pt reported that is blood sugar was reading "high" on his meter at home. Pt states that he took 65 units of 70/30 prior to arrival. Pt also reports dizziness and lightheadedness.

## 2012-04-03 NOTE — ED Provider Notes (Signed)
Patient with history of poorly controlled diabetes despite insulin injection with elevated blood sugars worsening over the last few days and then evidence of an abscess in his left axilla. Also for the last 5 hours patient has had worsening left hip pain and numbness and weakness in his left leg. He has a long history of chronic lower back pain but states he doesn't usually have problems with pain in his legs.  Gen.: Mild distress and appears to be in pain. Well-developed well-nourished. CV: RRR, no murmurs Resp: CTAB Abd: soft and nontender Musc: pain with palpation of the left lumbar area and pain in the left hip. Neuro: normal pulse, 4/5 strength in the left lower ext and mild numbness. Skin: abscess with fluctuance in the left axilla.  Pt will be placed on CDU hyperglycemia protocol for BS > 500 and also to get MRI of the lumbar spine in the am.  Gwyneth Sprout, MD 04/03/12 [phone]

## 2012-04-04 ENCOUNTER — Observation Stay (HOSPITAL_COMMUNITY): Payer: Self-pay

## 2012-04-04 LAB — POCT I-STAT, CHEM 8
Chloride: 101 mEq/L (ref 96–112)
Creatinine, Ser: 0.7 mg/dL (ref 0.50–1.35)
Glucose, Bld: 447 mg/dL — ABNORMAL HIGH (ref 70–99)
HCT: 35 % — ABNORMAL LOW (ref 39.0–52.0)
Hemoglobin: 11.9 g/dL — ABNORMAL LOW (ref 13.0–17.0)
Potassium: 3.6 mEq/L (ref 3.5–5.1)
Sodium: 139 mEq/L (ref 135–145)

## 2012-04-04 LAB — BASIC METABOLIC PANEL
BUN: 13 mg/dL (ref 6–23)
BUN: 10 mg/dL (ref 6–23)
CO2: 25 mEq/L (ref 19–32)
CO2: 25 mEq/L (ref 19–32)
Calcium: 8.3 mg/dL — ABNORMAL LOW (ref 8.4–10.5)
Calcium: 8.6 mg/dL (ref 8.4–10.5)
Chloride: 109 mEq/L (ref 96–112)
Creatinine, Ser: 0.64 mg/dL (ref 0.50–1.35)
Creatinine, Ser: 0.59 mg/dL (ref 0.50–1.35)
Creatinine, Ser: 0.57 mg/dL (ref 0.50–1.35)
GFR calc Af Amer: >90 mL/min (ref >90)
GFR calc Af Amer: >90 mL/min (ref >90)
GFR calc non Af Amer: >90 mL/min (ref >90)
GFR calc non Af Amer: >90 mL/min (ref >90)
Glucose, Bld: 463 mg/dL — ABNORMAL HIGH (ref 70–99)
Glucose, Bld: 126 mg/dL — ABNORMAL HIGH (ref 70–99)
Potassium: 3.6 mEq/L (ref 3.5–5.1)

## 2012-04-04 LAB — GLUCOSE, CAPILLARY
Glucose-Capillary: 271 mg/dL — ABNORMAL HIGH (ref 70–99)
Glucose-Capillary: 387 mg/dL — ABNORMAL HIGH (ref 70–99)

## 2012-04-04 MED ORDER — SODIUM CHLORIDE 0.9 % IV SOLN
INTRAVENOUS | Status: DC
  Administered 2012-04-04: 2.1 [IU]/h via INTRAVENOUS
  Filled 2012-04-04: qty 1

## 2012-04-04 MED ORDER — IBUPROFEN 600 MG PO TABS: 600.0000 mg | ORAL_TABLET | Freq: Four times a day (QID) | ORAL | Status: DC | PRN

## 2012-04-04 MED ORDER — ONDANSETRON HCL 4 MG/2ML IJ SOLN: 4.0000 mg | Freq: Four times a day (QID) | INTRAMUSCULAR | Status: DC | PRN

## 2012-04-04 MED ORDER — SODIUM CHLORIDE 0.9 % IV SOLN
Freq: Once | INTRAVENOUS | Status: AC
  Administered 2012-04-04: 01:00:00 via INTRAVENOUS

## 2012-04-04 MED ORDER — SODIUM CHLORIDE 0.9 % IV SOLN
Freq: Once | INTRAVENOUS | Status: AC
  Administered 2012-04-04: 03:00:00 via INTRAVENOUS

## 2012-04-04 MED ORDER — ZOLPIDEM TARTRATE 5 MG PO TABS: 5.0000 mg | ORAL_TABLET | Freq: Every evening | ORAL | Status: DC | PRN

## 2012-04-04 MED ORDER — OXYCODONE-ACETAMINOPHEN 5-325 MG PO TABS: 1.0000 | ORAL_TABLET | Freq: Four times a day (QID) | ORAL | Status: DC | PRN

## 2012-04-04 MED ORDER — HYDROMORPHONE HCL PF 1 MG/ML IJ SOLN
1.0000 mg | Freq: Four times a day (QID) | INTRAMUSCULAR | Status: DC | PRN
  Administered 2012-04-04: 1 mg via INTRAVENOUS
  Filled 2012-04-04: qty 1

## 2012-04-04 MED ORDER — DEXTROSE-NACL 5-0.45 % IV SOLN: INTRAVENOUS | Status: DC

## 2012-04-04 NOTE — ED Provider Notes (Signed)
Medical screening examination/treatment/procedure(s) were performed by non-physician practitioner and as supervising physician I was immediately available for consultation/collaboration.   Lyndel Sarate, MD 04/04/12 1408 

## 2012-04-04 NOTE — ED Notes (Signed)
CBG 230 

## 2012-04-04 NOTE — ED Notes (Signed)
CBG 335  

## 2012-04-04 NOTE — ED Notes (Signed)
CBG=387 

## 2012-04-04 NOTE — Discharge Instructions (Signed)
Take your Insulin as prescribed and follow up with Primary care physician for possible changes to your insulin regimen.   Followup with orthopedics if symptoms continue. Use conservative methods at home including heat therapy and cold therapy as we discussed. More information on cold therapy is listed below.  It is not recommended to use heat treatment directly after an acute injury.    SEEK IMMEDIATE MEDICAL ATTENTION IF: New numbness, tingling, weakness, or problem with the use of your arms or legs.  Severe back pain not relieved with medications.  Change in bowel or bladder control.  Increasing pain in any areas of the body (such as chest or abdominal pain).  Shortness of breath, dizziness or fainting.  Nausea (feeling sick to your stomach), vomiting, fever, or sweats.  COLD THERAPY DIRECTIONS:  Ice or gel packs can be used to reduce both pain and swelling. Ice is the most helpful within the first 24 to 48 hours after an injury or flareup from overusing a muscle or joint.  Ice is effective, has very few side effects, and is safe for most people to use.   If you expose your skin to cold temperatures for too long or without the proper protection, you can damage your skin or nerves. Watch for signs of skin damage due to cold.   HOME CARE INSTRUCTIONS  Follow these tips to use ice and cold packs safely.  Place a dry or damp towel between the ice and skin. A damp towel will cool the skin more quickly, so you may need to shorten the time that the ice is used.  For a more rapid response, add gentle compression to the ice.  Ice for no more than 10 to 20 minutes at a time. The bonier the area you are icing, the less time it will take to get the benefits of ice.  Check your skin after 5 minutes to make sure there are no signs of a poor response to cold or skin damage.  Rest 20 minutes or more in between uses.  Once your skin is numb, you can end your treatment. You can test numbness by very lightly  touching your skin. The touch should be so light that you do not see the skin dimple from the pressure of your fingertip. When using ice, most people will feel these normal sensations in this order: cold, burning, aching, and numbness.  Do not use ice on someone who cannot communicate their responses to pain, such as small children or people with dementia.   HOW TO MAKE AN ICE PACK  To make an ice pack, do one of the following:  Place crushed ice or a bag of frozen vegetables in a sealable plastic bag. Squeeze out the excess air. Place this bag inside another plastic bag. Slide the bag into a pillowcase or place a damp towel between your skin and the bag.  Mix 3 parts water with 1 part rubbing alcohol. Freeze the mixture in a sealable plastic bag. When you remove the mixture from the freezer, it will be slushy. Squeeze out the excess air. Place this bag inside another plastic bag. Slide the bag into a pillowcase or place a damp towel between your skin and the bag.   SEEK MEDICAL CARE IF:  You develop white spots on your skin. This may give the skin a blotchy (mottled) appearance.  Your skin turns blue or pale.  Your skin becomes waxy or hard.  Your swelling gets worse.  MAKE SURE  YOU:  Understand these instructions.  Will watch your condition.  Will get help right away if you are not doing well or get worse.    Chronic Pain Discharge Instructions  Emergency care providers appreciate that many patients coming to [place] are in severe pain and we wish to address their pain in the safest, most responsible manner.  It is important to recognize however, that the proper treatment of chronic pain differs from that of the pain of injuries and acute illnesses.  Our goal is to provide quality, safe, personalized care and we thank you for giving [place] the opportunity to serve you. The use of narcotics and related agents for chronic pain syndromes may lead to additional physical and psychological problems.  Nearly  as many people die from prescription narcotics each year as die from car crashes.  Additionally, this risk is increased if such prescriptions are obtained from a variety of sources.  Therefore, only your primary care physician or a pain management specialist is able to safely treat such syndromes with narcotic medications long-term.    Documentation revealing such prescriptions have been sought from multiple sources may prohibit [place] from providing a refill or different narcotic medication.  Your name may be checked first through the [facility] Controlled Substances Reporting System.  This database is a record of controlled substance medication prescriptions that the patient has received.  This has been established by [facility] in an effort to eliminate the dangerous, and often life threatening, practice of obtaining multiple prescriptions from different medical providers.   If you have a chronic pain syndrome (i.e. chronic headaches, recurrent back or neck pain, dental pain, abdominal or pelvis pain without a specific diagnosis, or neuropathic pain such as fibromyalgia) or recurrent visits for the same condition without an acute diagnosis, you may be treated with non-narcotics and other non-addictive medicines.  Allergic reactions or negative side effects that may be reported by a patient to such medications will not typically lead to the use of a narcotic analgesic or other controlled substance as an alternative.   Patients managing chronic pain with a personal physician should have provisions in place for breakthrough pain.  If you are in crisis, you should call your physician.  If your physician directs you to the emergency department, please have the doctor call and speak to our attending physician concerning your care.   When patients come to the Emergency Department (ED) with acute medical conditions in which the Emergency Department physician feels appropriate to prescribe narcotic or sedating  pain medication, the physician will prescribe these in very limited quantities.  The amount of these medications will last only until you can see your primary care physician in his/her office.  Any patient who returns to the ED seeking refills should expect only non-narcotic pain medications.   In the event of an acute medical condition exists and the emergency physician feels it is necessary that the patient be given a narcotic or sedating medication -  a responsible adult driver should be present in the room prior to the medication being given by the nurse.   Prescriptions for narcotic or sedating medications that have been lost, stolen or expired will not be refilled in the Emergency Department.    Patients who have chronic pain may receive non-narcotic prescriptions until seen by their primary care physician.  It is every patient's personal responsibility to maintain active prescriptions with his or her primary care physician or specialist.

## 2012-04-04 NOTE — ED Notes (Signed)
Urinal provided for pt.

## 2012-04-04 NOTE — Progress Notes (Signed)
Observation review is complete. 

## 2012-04-04 NOTE — ED Notes (Signed)
Pt provided bus pass, discharged home.

## 2012-04-04 NOTE — ED Notes (Signed)
Patient transported to MRI 

## 2012-04-04 NOTE — ED Notes (Signed)
In to see patient, pt remains in MRI.

## 2012-04-04 NOTE — ED Notes (Signed)
D/C insulin drip per verbal by Dr. Dierdre Highman.

## 2012-04-04 NOTE — ED Notes (Signed)
Pt back from MRI. Pt is now resting.

## 2012-04-04 NOTE — ED Provider Notes (Signed)
7:00 AM Assumed care of patient in the CDU.  Patient is currently on hyperglycemia and back pain protocol.  Plan is for patient to have a MRI of his lumbar spine this morning.  Patient with a history of DM type II and chronic back pain.  Yesterday the back pain was associated with numbness and weakness of the left leg.  No loss of bowel or bladder incontinence.  Patient has been on the glucostabilizer overnight and blood sugars have been trending down.  Back pain has been controlled with IV Dilaudid every 6 hours.   8:05 AM Patient has now returned from MRI.  Reassessed patient.  He reports that he continues to have back pain, but the pain has improved somewhat.  He also reports that he continues to have weakness and numbness of his left leg.  On exam, Patient is alert and orientated x 3, Heart RRR, Lungs CTAB, Lumbar spine and left hip tender to palpation, muscle strength LLE 4/5, 5/5 of the RLE.  Decreased sensation of the left foot.  Normal gait.  8:35 AM Discussed results of the MRI with patient.  MRI showed Grade I Spondylisthesis and Ankylosing Spondylitis.  Patient given prescription for short course of pain medication and NSAIDs.  Patient also given referral to Orthopedics.  Patient has an appointment with [facility] to have his DM managed and to have appropriate changes made to his insulin.  Return precautions have been discussed.  Patient in agreement with the plan.   Pascal Lux [place], PA-C 04/04/12 1136

## 2012-04-04 NOTE — ED Notes (Signed)
CBG 271  

## 2012-04-04 NOTE — ED Notes (Signed)
Dressing applied to left underarm

## 2012-04-06 NOTE — ED Provider Notes (Signed)
Medical screening examination/treatment/procedure(s) were conducted as a shared visit with non-physician practitioner(s) and myself.  I personally evaluated the patient during the encounter   Gwyneth Sprout, MD 04/06/12 2042

## 2012-04-08 ENCOUNTER — Encounter (HOSPITAL_COMMUNITY): Payer: Self-pay | Admitting: Emergency Medicine

## 2012-04-08 ENCOUNTER — Emergency Department (HOSPITAL_COMMUNITY)
Admission: EM | Admit: 2012-04-08 | Discharge: 2012-04-08 | Disposition: A | Discharge status: Home / Self Care | Payer: Self-pay | Attending: Emergency Medicine | Admitting: Emergency Medicine

## 2012-04-08 DIAGNOSIS — Z79899 Other long term (current) drug therapy: Secondary | ICD-10-CM | POA: Insufficient documentation

## 2012-04-08 DIAGNOSIS — L02411 Cutaneous abscess of right axilla: Secondary | ICD-10-CM

## 2012-04-08 DIAGNOSIS — K5090 Crohn's disease, unspecified, without complications: Secondary | ICD-10-CM | POA: Insufficient documentation

## 2012-04-08 DIAGNOSIS — Z794 Long term (current) use of insulin: Secondary | ICD-10-CM | POA: Insufficient documentation

## 2012-04-08 DIAGNOSIS — R739 Hyperglycemia, unspecified: Secondary | ICD-10-CM

## 2012-04-08 DIAGNOSIS — F17200 Nicotine dependence, unspecified, uncomplicated: Secondary | ICD-10-CM | POA: Insufficient documentation

## 2012-04-08 DIAGNOSIS — IMO0002 Reserved for concepts with insufficient information to code with codable children: Secondary | ICD-10-CM | POA: Insufficient documentation

## 2012-04-08 DIAGNOSIS — E119 Type 2 diabetes mellitus without complications: Secondary | ICD-10-CM | POA: Insufficient documentation

## 2012-04-08 DIAGNOSIS — L02412 Cutaneous abscess of left axilla: Secondary | ICD-10-CM

## 2012-04-08 DIAGNOSIS — F4310 Post-traumatic stress disorder, unspecified: Secondary | ICD-10-CM | POA: Insufficient documentation

## 2012-04-08 LAB — URINE MICROSCOPIC-ADD ON

## 2012-04-08 LAB — URINALYSIS, ROUTINE W REFLEX MICROSCOPIC
Leukocytes, UA: NEGATIVE
Nitrite: NEGATIVE
Specific Gravity, Urine: 1.021 (ref 1.005–1.030)
Urobilinogen, UA: 0.2 mg/dL (ref 0.0–1.0)

## 2012-04-08 LAB — CBC
Platelets: 135 10*3/uL — ABNORMAL LOW (ref 150–400)
RBC: 4.71 MIL/uL (ref 4.22–5.81)
RDW: 13 % (ref 11.5–15.5)
WBC: 9.6 10*3/uL (ref 4.0–10.5)

## 2012-04-08 LAB — GLUCOSE, CAPILLARY
Glucose-Capillary: 420 mg/dL — ABNORMAL HIGH (ref 70–99)
Glucose-Capillary: 328 mg/dL — ABNORMAL HIGH (ref 70–99)

## 2012-04-08 LAB — POCT I-STAT, CHEM 8
Glucose, Bld: 402 mg/dL — ABNORMAL HIGH (ref 70–99)
HCT: 37 % — ABNORMAL LOW (ref 39.0–52.0)
Hemoglobin: 12.6 g/dL — ABNORMAL LOW (ref 13.0–17.0)
Potassium: 3.8 mEq/L (ref 3.5–5.1)
Sodium: 138 mEq/L (ref 135–145)

## 2012-04-08 MED ORDER — SODIUM CHLORIDE 0.9 % IV BOLUS (SEPSIS)
1000.0000 mL | Freq: Once | INTRAVENOUS | Status: AC
  Administered 2012-04-08: 1000 mL via INTRAVENOUS

## 2012-04-08 MED ORDER — INSULIN ASPART 100 UNIT/ML ~~LOC~~ SOLN
10.0000 [IU] | Freq: Once | SUBCUTANEOUS | Status: AC
  Administered 2012-04-08: 10 [IU] via SUBCUTANEOUS
  Filled 2012-04-08: qty 1

## 2012-04-08 MED ORDER — SULFAMETHOXAZOLE-TRIMETHOPRIM 800-160 MG PO TABS: 1.0000 | ORAL_TABLET | Freq: Two times a day (BID) | ORAL | Status: DC

## 2012-04-08 MED ORDER — INSULIN NPH ISOPHANE & REGULAR (70-30) 100 UNIT/ML ~~LOC~~ SUSP: 75.0000 [IU] | Freq: Two times a day (BID) | SUBCUTANEOUS | Status: DC

## 2012-04-08 MED ORDER — KETOROLAC TROMETHAMINE 30 MG/ML IJ SOLN
30.0000 mg | Freq: Once | INTRAMUSCULAR | Status: AC
  Administered 2012-04-08: 30 mg via INTRAVENOUS
  Filled 2012-04-08: qty 1

## 2012-04-08 MED ORDER — TRAMADOL HCL 50 MG PO TABS: 50.0000 mg | ORAL_TABLET | Freq: Four times a day (QID) | ORAL | Status: DC | PRN

## 2012-04-08 MED ORDER — SULFAMETHOXAZOLE-TMP DS 800-160 MG PO TABS
1.0000 | ORAL_TABLET | Freq: Once | ORAL | Status: AC
  Administered 2012-04-08: 1 via ORAL
  Filled 2012-04-08: qty 1

## 2012-04-08 NOTE — ED Notes (Signed)
Notified for assist with IV to 2 fellow RN's.  Attempting IV stick at this time.

## 2012-04-08 NOTE — Discharge Instructions (Signed)
Please increase the dose of your insulin to 75 units twice a day, make sure that you take your blood sugar with every meal and before bedtime. If you find that your blood sugar is dropping below 100, reduce the amount of insulin to 65 units twice a day. Please have your Dr. reevaluate your blood sugars within the next 2 weeks. Call today for a followup appointment. Take Bactrim twice a day for 10 days to treat these infections. Please follow the instructions for abscesses attached.

## 2012-04-08 NOTE — ED Notes (Signed)
Per EMS pt c/o drainage from abscess to L axillary area, rash to legs and out of pain medication given 04/04/12

## 2012-04-08 NOTE — ED Provider Notes (Signed)
History     CSN: [id number]  Arrival date & time 04/08/12  0320   First MD Initiated Contact with Patient 04/08/12 [phone]      Chief Complaint  Patient presents with  . Abscess    (Consider location/radiation/quality/duration/timing/severity/associated sxs/prior treatment) HPI Comments: 36 year old male with a history of diabetes, Crohn's who presents with the complaint of abscesses and hyperglycemia.  Abscess: Patient has had bilateral axillary abscesses the left abscess which was drained last week and has improved significantly and in abscess in the right axilla which is mild, early and associated with mild tenderness but no erythema fevers nausea or vomiting.  Hyperglycemia: The patient states that he has had hyperglycemia which has been persistent since being diagnosed with diabetes. He has failed treatment with multiple different medications as an outpatient and currently takes 65 units of 7030 insulin twice a day in addition to metformin. He states that despite taking these medications his blood sugar remains above 400 most of the time. He admits to having mild weakness and feeling lightheaded when he stands but no problems with coughing shortness of breath chest pain dysuria. He has had rashes as of the last week which have been treated with incision and drainage in the left axilla and packing placement. The packing has now come out. During his last visit he received IV fluids and IV insulin to reduce his blood sugar down to 200s.  Patient is a 36 y.o. male presenting with abscess. The history is provided by the patient and medical records.  Abscess     Past Medical History  Diagnosis Date  . Diabetes mellitus   . Crohn disease   . PTSD (post-traumatic stress disorder)     Past Surgical History  Procedure Date  . Cholecystectomy   . Orchiectomy   . Foot surgery     Family History  Problem Relation Age of Onset  . Diabetes Mother   . Diabetes Father     History    Substance Use Topics  . Smoking status: Current Everyday Smoker -- 0.5 packs/day    Types: Cigarettes  . Smokeless tobacco: Not on file  . Alcohol Use: No      Review of Systems  All other systems reviewed and are negative.    Allergies  Acetaminophen and Codeine  Home Medications   Current Outpatient Rx  Name Route Sig Dispense Refill  . IBUPROFEN 600 MG PO TABS Oral Take 600 mg by mouth every 6 (six) hours as needed. For pain    . MESALAMINE 400 MG PO TBEC Oral Take 800 mg by mouth 3 (three) times daily.    Marland Kitchen METFORMIN HCL 500 MG PO TABS Oral Take 500 mg by mouth 2 (two) times daily with a meal.    . OXYCODONE-ACETAMINOPHEN 5-325 MG PO TABS Oral Take 1-2 tablets by mouth every 6 (six) hours as needed for pain. 20 tablet 0  . TESTOSTERONE 50 MG/5GM TD GEL Transdermal Place 5 g onto the skin daily.    . INSULIN ISOPHANE & REGULAR (70-30) 100 UNIT/ML [place] SUSP Subcutaneous Inject 75 Units into the skin 2 (two) times daily. 10 mL 2  . SULFAMETHOXAZOLE-TRIMETHOPRIM 800-160 MG PO TABS Oral Take 1 tablet by mouth every 12 (twelve) hours. 20 tablet 0  . TRAMADOL HCL 50 MG PO TABS Oral Take 1 tablet (50 mg total) by mouth every 6 (six) hours as needed for pain. 15 tablet 0    BP 139/83  Pulse 97  Temp 98.7 F (  37.1 C) (Oral)  Resp 26  Ht 5\' 10"  (1.778 m)  Wt 250 lb (113.399 kg)  BMI 35.87 kg/m2  SpO2 98%  Physical Exam  Nursing note and vitals reviewed. Constitutional: He appears well-developed and well-nourished. No distress.  HENT:  Head: Normocephalic and atraumatic.  Mouth/Throat: Oropharynx is clear and moist. No oropharyngeal exudate.  Eyes: Conjunctivae and EOM are normal. Pupils are equal, round, and reactive to light. Right eye exhibits no discharge. Left eye exhibits no discharge. No scleral icterus.  Neck: Normal range of motion. Neck supple. No JVD present. No thyromegaly present.  Cardiovascular: Normal rate, regular rhythm, normal heart sounds and intact distal  pulses.  Exam reveals no gallop and no friction rub.   No murmur heard. Pulmonary/Chest: Effort normal and breath sounds normal. No respiratory distress. He has no wheezes. He has no rales.  Abdominal: Soft. Bowel sounds are normal. He exhibits no distension and no mass. There is no tenderness.  Musculoskeletal: Normal range of motion. He exhibits no edema and no tenderness.  Lymphadenopathy:    He has no cervical adenopathy.  Neurological: He is alert. Coordination normal.  Skin: Skin is warm and dry. Rash ( Subcentimeter nodule or mass in the right axilla consistent with early abscess, minimally tender, no warmth, no spreading erythema. Left axilla with drained abscess with no purulent material in it. Nontender, no masses, no erythema) noted.  Psychiatric: He has a normal mood and affect. His behavior is normal.    ED Course  Procedures (including critical care time)  Labs Reviewed  GLUCOSE, CAPILLARY - Abnormal; Notable for the following:    Glucose-Capillary 420 (*)     All other components within normal limits  CBC - Abnormal; Notable for the following:    Hemoglobin 12.0 (*)     HCT 35.8 (*)     MCV 76.0 (*)     MCH 25.5 (*)     Platelets 135 (*)     All other components within normal limits  POCT I-STAT, CHEM 8 - Abnormal; Notable for the following:    Glucose, Bld 402 (*)     Hemoglobin 12.6 (*)     HCT 37.0 (*)     All other components within normal limits  GLUCOSE, CAPILLARY - Abnormal; Notable for the following:    Glucose-Capillary 328 (*)     All other components within normal limits  URINALYSIS, ROUTINE W REFLEX MICROSCOPIC   No results found.   1. Hyperglycemia   2. Abscess of right axilla   3. Abscess of left axilla       MDM  Blood sugar 420, has bilateral infections, the left axilla which has improved significantly in the right axilla which is new. Both will be amenable to antibiotic treatment, no need for incision and drainage at this time. He has  vital signs which are unremarkable at this time, IV fluids, insulin, rule out DKA, antibiotics.  Blood sugar has reduced to just over 300 with subcutaneous insulin and IV fluids, vital signs remain normal, patient has been sleeping without complaints. We'll increase home dose of insulin to 75 units twice a day, Bactrim twice a day for 10 days, followup with family Dr. for reevaluation of blood sugars and insulin dosing this week. Patient has expressed his understanding.  Discharge Prescriptions include:  Bactrim       Vida Roller, MD 04/08/12 [phone]

## 2012-04-12 ENCOUNTER — Emergency Department (HOSPITAL_COMMUNITY)
Admission: EM | Admit: 2012-04-12 | Discharge: 2012-04-12 | Disposition: A | Discharge status: Home / Self Care | Payer: Self-pay | Attending: Emergency Medicine | Admitting: Emergency Medicine

## 2012-04-12 ENCOUNTER — Encounter (HOSPITAL_COMMUNITY): Payer: Self-pay

## 2012-04-12 DIAGNOSIS — E119 Type 2 diabetes mellitus without complications: Secondary | ICD-10-CM | POA: Insufficient documentation

## 2012-04-12 DIAGNOSIS — Z833 Family history of diabetes mellitus: Secondary | ICD-10-CM | POA: Insufficient documentation

## 2012-04-12 DIAGNOSIS — F4310 Post-traumatic stress disorder, unspecified: Secondary | ICD-10-CM | POA: Insufficient documentation

## 2012-04-12 DIAGNOSIS — K5090 Crohn's disease, unspecified, without complications: Secondary | ICD-10-CM | POA: Insufficient documentation

## 2012-04-12 DIAGNOSIS — Z794 Long term (current) use of insulin: Secondary | ICD-10-CM | POA: Insufficient documentation

## 2012-04-12 DIAGNOSIS — IMO0002 Reserved for concepts with insufficient information to code with codable children: Secondary | ICD-10-CM | POA: Insufficient documentation

## 2012-04-12 DIAGNOSIS — Z9079 Acquired absence of other genital organ(s): Secondary | ICD-10-CM | POA: Insufficient documentation

## 2012-04-12 DIAGNOSIS — Z9089 Acquired absence of other organs: Secondary | ICD-10-CM | POA: Insufficient documentation

## 2012-04-12 DIAGNOSIS — L02411 Cutaneous abscess of right axilla: Secondary | ICD-10-CM

## 2012-04-12 LAB — GLUCOSE, CAPILLARY: Glucose-Capillary: 445 mg/dL — ABNORMAL HIGH (ref 70–99)

## 2012-04-12 LAB — BASIC METABOLIC PANEL
CO2: 24 mEq/L (ref 19–32)
Calcium: 9.4 mg/dL (ref 8.4–10.5)
Creatinine, Ser: 0.58 mg/dL (ref 0.50–1.35)
GFR calc non Af Amer: >90 mL/min (ref >90)
Sodium: 134 mEq/L — ABNORMAL LOW (ref 135–145)

## 2012-04-12 MED ORDER — SULFAMETHOXAZOLE-TRIMETHOPRIM 800-160 MG PO TABS: 1.0000 | ORAL_TABLET | Freq: Two times a day (BID) | ORAL | Status: AC
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
## 2012-04-13 NOTE — ED Provider Notes (Signed)
Medical screening examination/treatment/procedure(s) were performed by non-physician practitioner and as supervising physician I was immediately available for consultation/collaboration.  Toy Baker, MD 04/13/12 [phone]

## 2012-04-14 ENCOUNTER — Emergency Department (HOSPITAL_COMMUNITY)
Admission: EM | Admit: 2012-04-14 | Discharge: 2012-04-15 | Disposition: A | Discharge status: Home / Self Care | Payer: Self-pay | Attending: Emergency Medicine | Admitting: Emergency Medicine

## 2012-04-14 ENCOUNTER — Encounter (HOSPITAL_COMMUNITY): Payer: Self-pay | Admitting: Emergency Medicine

## 2012-04-14 DIAGNOSIS — R739 Hyperglycemia, unspecified: Secondary | ICD-10-CM

## 2012-04-14 DIAGNOSIS — Z794 Long term (current) use of insulin: Secondary | ICD-10-CM | POA: Insufficient documentation

## 2012-04-14 DIAGNOSIS — E119 Type 2 diabetes mellitus without complications: Secondary | ICD-10-CM | POA: Insufficient documentation

## 2012-04-14 DIAGNOSIS — R6883 Chills (without fever): Secondary | ICD-10-CM | POA: Insufficient documentation

## 2012-04-14 DIAGNOSIS — R5383 Other fatigue: Secondary | ICD-10-CM | POA: Insufficient documentation

## 2012-04-14 DIAGNOSIS — R1031 Right lower quadrant pain: Secondary | ICD-10-CM | POA: Insufficient documentation

## 2012-04-14 DIAGNOSIS — Z79899 Other long term (current) drug therapy: Secondary | ICD-10-CM | POA: Insufficient documentation

## 2012-04-14 DIAGNOSIS — R5381 Other malaise: Secondary | ICD-10-CM | POA: Insufficient documentation

## 2012-04-14 DIAGNOSIS — R109 Unspecified abdominal pain: Secondary | ICD-10-CM

## 2012-04-14 LAB — GLUCOSE, CAPILLARY: Glucose-Capillary: 551 mg/dL (ref 70–99)

## 2012-04-14 MED ORDER — SODIUM CHLORIDE 0.9 % IV BOLUS (SEPSIS)
2000.0000 mL | Freq: Once | INTRAVENOUS | Status: AC
  Administered 2012-04-15: 2000 mL via INTRAVENOUS

## 2012-04-14 NOTE — ED Notes (Signed)
Pt states his BS usually runs high although he is not symptomatic, pt states he did vomit x 1 today after lunch. Began having abd pain with weakness and general ill feeling after dinner tonight. Pt states he has taken meds as prescribed. Active BS, A & O. C/o HA. Denies nausea.

## 2012-04-14 NOTE — ED Notes (Signed)
ZOX:WRUE<AV> Expected date:<BR> Expected time:<BR> Means of arrival:<BR> Comments:<BR> Medic 10,  Hyperglycemia, HTN

## 2012-04-14 NOTE — ED Notes (Signed)
Pt states he is having a general ill feeling, weakness onset @ 3 hours ago. CBG 507 enroute. Pt here for same last week.

## 2012-04-15 ENCOUNTER — Emergency Department (HOSPITAL_COMMUNITY): Payer: Self-pay

## 2012-04-15 LAB — BASIC METABOLIC PANEL
BUN: 21 mg/dL (ref 6–23)
Creatinine, Ser: 0.87 mg/dL (ref 0.50–1.35)
GFR calc Af Amer: >90 mL/min (ref >90)
GFR calc non Af Amer: >90 mL/min (ref >90)
Potassium: 3.8 mEq/L (ref 3.5–5.1)

## 2012-04-15 LAB — CBC
HCT: 36.6 % — ABNORMAL LOW (ref 39.0–52.0)
MCHC: 34.7 g/dL (ref 30.0–36.0)
Platelets: 140 10*3/uL — ABNORMAL LOW (ref 150–400)
RDW: 13 % (ref 11.5–15.5)

## 2012-04-15 LAB — GLUCOSE, CAPILLARY: Glucose-Capillary: 410 mg/dL — ABNORMAL HIGH (ref 70–99)

## 2012-04-15 MED ORDER — INSULIN ASPART 100 UNIT/ML ~~LOC~~ SOLN
SUBCUTANEOUS | Status: AC
  Administered 2012-04-15: 10 [IU]
  Filled 2012-04-15: qty 10

## 2012-04-15 MED ORDER — INSULIN REGULAR HUMAN 100 UNIT/ML IJ SOLN: 10.0000 [IU] | Freq: Once | INTRAMUSCULAR | Status: DC

## 2012-04-15 MED ORDER — IOHEXOL 300 MG/ML  SOLN
100.0000 mL | Freq: Once | INTRAMUSCULAR | Status: AC | PRN
  Administered 2012-04-15: 100 mL via INTRAVENOUS

## 2012-04-15 MED ORDER — HYDROCODONE-ACETAMINOPHEN 5-325 MG PO TABS: 1.0000 | ORAL_TABLET | ORAL | Status: AC | PRN

## 2012-04-15 MED ORDER — MORPHINE SULFATE 4 MG/ML IJ SOLN
6.0000 mg | Freq: Once | INTRAMUSCULAR | Status: AC
  Administered 2012-04-15: 6 mg via INTRAVENOUS
  Filled 2012-04-15: qty 2

## 2012-04-15 MED ORDER — INSULIN ASPART 100 UNIT/ML ~~LOC~~ SOLN: 10.0000 [IU] | Freq: Once | SUBCUTANEOUS | Status: AC

## 2012-04-15 NOTE — ED Provider Notes (Signed)
History     CSN: [id number]  Arrival date & time 04/14/12  2322   First MD Initiated Contact with Patient 04/14/12 2336      Chief Complaint  Patient presents with  . Hyperglycemia    The history is provided by the patient.   patient is a diabetic he reports his blood sugars are normal in the low 400s he was seen emergency room several days ago for a right axilla abscess.  He is status post incision and drainage is currently on Bactrim for this.  He reports improvement in his right axilla pain.  He presents the emergency department tonight reporting generalized weakness and elevated blood sugars in the 500s.  The patient denies nausea and vomiting.  He reports new development of right lower quadrant abdominal pain over the past 24 hours.  He denies anorexia.  He denies dysuria or urinary frequency.  He has no flank pain.  He reports chills without documented fever.  He denies cough or shortness of breath.  He denies sore throat  Past Medical History  Diagnosis Date  . Diabetes mellitus   . Crohn disease   . PTSD (post-traumatic stress disorder)     Past Surgical History  Procedure Date  . Cholecystectomy   . Orchiectomy   . Foot surgery     Family History  Problem Relation Age of Onset  . Diabetes Mother   . Diabetes Father     History  Substance Use Topics  . Smoking status: Current Everyday Smoker -- 0.5 packs/day    Types: Cigarettes  . Smokeless tobacco: Not on file  . Alcohol Use: No      Review of Systems  All other systems reviewed and are negative.    Allergies  Acetaminophen and Codeine  Home Medications   Current Outpatient Rx  Name Route Sig Dispense Refill  . SULFAMETHOXAZOLE-TRIMETHOPRIM 800-160 MG PO TABS Oral Take 1 tablet by mouth every 12 (twelve) hours. 20 tablet 0  . CITALOPRAM HYDROBROMIDE 40 MG PO TABS Oral Take 40 mg by mouth daily.    . IBUPROFEN 600 MG PO TABS Oral Take 600 mg by mouth every 6 (six) hours as needed. For pain    .  INSULIN ISOPHANE & REGULAR (70-30) 100 UNIT/ML [place] SUSP Subcutaneous Inject 75 Units into the skin 2 (two) times daily. 10 mL 2  . MESALAMINE 400 MG PO TBEC Oral Take 800 mg by mouth 3 (three) times daily.    Marland Kitchen METFORMIN HCL 500 MG PO TABS Oral Take 500 mg by mouth 2 (two) times daily with a meal.      BP 148/70  Pulse 76  Temp 97.3 F (36.3 C) (Oral)  Resp 16  Ht 5\' 10"  (1.778 m)  Wt 250 lb (113.399 kg)  BMI 35.87 kg/m2  SpO2 99%  Physical Exam  Nursing note and vitals reviewed. Constitutional: He is oriented to person, place, and time. He appears well-developed and well-nourished.  HENT:  Head: Normocephalic and atraumatic.  Eyes: EOM are normal.  Neck: Normal range of motion.  Cardiovascular: Normal rate, regular rhythm, normal heart sounds and intact distal pulses.   Pulmonary/Chest: Effort normal and breath sounds normal. No respiratory distress.  Abdominal: Soft. He exhibits no distension.       Tenderness without guarding or rebound the right lower quadrant  Musculoskeletal: Normal range of motion.       Right axilla with healing abscess without secondary signs of cellulitis  Neurological: He is alert and  oriented to person, place, and time.  Skin: Skin is warm and dry.  Psychiatric: He has a normal mood and affect. Judgment normal.    ED Course  Procedures (including critical care time)  Labs Reviewed  GLUCOSE, CAPILLARY - Abnormal; Notable for the following:    Glucose-Capillary 551 (*)     All other components within normal limits  CBC - Abnormal; Notable for the following:    Hemoglobin 12.7 (*)     HCT 36.6 (*)     MCV 74.5 (*)     MCH 25.9 (*)     Platelets 140 (*)     All other components within normal limits  BASIC METABOLIC PANEL - Abnormal; Notable for the following:    Sodium 131 (*)     Chloride 91 (*)     Glucose, Bld 526 (*)     All other components within normal limits  GLUCOSE, CAPILLARY - Abnormal; Notable for the following:     Glucose-Capillary 410 (*)     All other components within normal limits   Ct Abdomen Pelvis W Contrast  04/15/2012  *RADIOLOGY REPORT*  Clinical Data: Right lower quadrant abdominal pain, nausea and vomiting.  Hyperglycemia.  History of Crohn's disease.  CT ABDOMEN AND PELVIS WITH CONTRAST  Technique:  Multidetector CT imaging of the abdomen and pelvis was performed following the standard protocol during bolus administration of intravenous contrast.  Contrast: OMNIPAQUE IOHEXOL 300 MG/ML  SOLN  Comparison: CT of the abdomen and pelvis performed 05/28/2010, and MRI of the lumbar spine performed 04/04/2012  Findings: Minimal bibasilar atelectasis is noted.  Mild prominence of the intrahepatic biliary ducts appears to reflect prior cholecystectomy.  The liver and spleen are otherwise unremarkable in appearance. The pancreas is unremarkable.  The adrenal glands are within normal limits.  The kidneys are unremarkable in appearance.  There is no evidence of hydronephrosis.  No renal or ureteral stones are seen.  No perinephric stranding is appreciated.  No free fluid is identified.  The small bowel is unremarkable in appearance.  The stomach is within normal limits.  No acute vascular abnormalities are seen.  A tiny umbilical hernia is noted, containing only fat.  The appendix is normal in caliber, without evidence for appendicitis.  The colon is unremarkable in appearance.  Scattered mildly prominent mesenteric nodes are nonspecific in appearance.  The bladder is moderately distended and grossly unremarkable in appearance.  The prostate remains normal in size.  No inguinal lymphadenopathy is seen.  Mild nonspecific soft tissue stranding is noted along the anterior abdominal wall at the right lower quadrant.  No acute osseous abnormalities are identified.  Diffuse syndesmophytes along the thoracic and lumbar spine raise suspicion for ankylosing spondylitis; there is also fusion at the sacroiliac joints.   IMPRESSION:  1.  No evidence of acute exacerbation of the patient's Crohn's disease. 2.  Mildly prominent mesenteric nodes, nonspecific in appearance. 3.  Mild nonspecific soft tissue stranding along the anterior abdominal wall at the right lower quadrant, possibly reflecting mild soft tissue injury. 4.  Tiny umbilical hernia, containing only fat. 5.  Diffuse syndesmophytes along the thoracic and lumbar spine, with fusion at the sacroiliac joints, most compatible with ankylosing spondylitis.  Original Report Authenticated By: Tonia Ghent, M.D.    I personally reviewed the imaging tests through PACS system  I reviewed available ER/hospitalization records thought the EMR   1. Abdominal pain   2. Hyperglycemia       MDM  The  patient feels much better at this time.  His abdominal CT demonstrates no acute pathology.  His blood sugar is improving in the emergency department.  The patient's blood sugars consistently in the 400s at home and therefore I would not worry about driving it down further than this.  The patient was hydrated.  Discharge home in good condition.  I stressed importance of close PCP followup and improved glycemic control        Lyanne Co, MD 04/15/12 [id number]

## 2012-04-17 ENCOUNTER — Ambulatory Visit: Payer: Self-pay | Admitting: Internal Medicine

## 2012-04-17 LAB — CBC CANCER CENTER
Basophil #: 0.1 x10 3/mm (ref 0.0–0.1)
Eosinophil %: 1.7 %
Lymphocyte #: 1.8 x10 3/mm (ref 1.0–3.6)
MCH: 25.5 pg — ABNORMAL LOW (ref 26.0–34.0)
MCHC: 30.3 g/dL — ABNORMAL LOW (ref 32.0–36.0)
MCV: 84 fL (ref 80–100)
Neutrophil %: 72.1 %
Platelet: 140 x10 3/mm — ABNORMAL LOW (ref 150–440)
RBC: 5.32 10*6/uL (ref 4.40–5.90)
RDW: 14 % (ref 11.5–14.5)

## 2012-04-17 LAB — COMPREHENSIVE METABOLIC PANEL
Anion Gap: 14 (ref 7–16)
Calcium, Total: 9.2 mg/dL (ref 8.5–10.1)
Chloride: 88 mmol/L — ABNORMAL LOW (ref 98–107)
EGFR (African American): >60
Glucose: 1099 mg/dL (ref 65–99)
Potassium: 4.3 mmol/L (ref 3.5–5.1)
SGOT(AST): 15 U/L (ref 15–37)

## 2012-04-17 LAB — URINALYSIS, COMPLETE
Bacteria: NONE SEEN
Blood: NEGATIVE
Glucose,UR: >=500 mg/dL (ref 0–75)
Nitrite: NEGATIVE
Ph: 6 (ref 4.5–8.0)
Specific Gravity: 1.023 (ref 1.003–1.030)
Squamous Epithelial: <1

## 2012-04-17 LAB — CBC
MCHC: 31.5 g/dL — ABNORMAL LOW (ref 32.0–36.0)
RDW: 13.7 % (ref 11.5–14.5)

## 2012-04-18 ENCOUNTER — Inpatient Hospital Stay: Payer: Self-pay | Admitting: Internal Medicine

## 2012-04-18 LAB — BASIC METABOLIC PANEL
Calcium, Total: 8.9 mg/dL (ref 8.5–10.1)
Co2: 29 mmol/L (ref 21–32)
Glucose: 197 mg/dL — ABNORMAL HIGH (ref 65–99)
Osmolality: 287 (ref 275–301)
Potassium: 3.3 mmol/L — ABNORMAL LOW (ref 3.5–5.1)
Sodium: 141 mmol/L (ref 136–145)

## 2012-04-19 LAB — BASIC METABOLIC PANEL
Anion Gap: 8 (ref 7–16)
BUN: 11 mg/dL (ref 7–18)
Chloride: 107 mmol/L (ref 98–107)
Creatinine: 0.74 mg/dL (ref 0.60–1.30)
EGFR (African American): >60
EGFR (Non-African Amer.): >60
Sodium: 142 mmol/L (ref 136–145)

## 2012-04-19 LAB — MAGNESIUM: Magnesium: 1.6 mg/dL — ABNORMAL LOW

## 2012-04-20 LAB — HEMOGLOBIN A1C: Hemoglobin A1C: 14.2 % — ABNORMAL HIGH (ref 4.2–6.3)

## 2012-04-20 LAB — POTASSIUM: Potassium: 3.9 mmol/L (ref 3.5–5.1)

## 2012-05-09 ENCOUNTER — Ambulatory Visit: Payer: Self-pay | Admitting: Internal Medicine

## 2012-05-18 ENCOUNTER — Inpatient Hospital Stay: Payer: Self-pay | Admitting: Internal Medicine

## 2012-05-18 LAB — URINALYSIS, COMPLETE
Bacteria: NONE SEEN
Bilirubin,UR: NEGATIVE
Blood: NEGATIVE
Glucose,UR: >=500 mg/dL (ref 0–75)
Leukocyte Esterase: NEGATIVE
RBC,UR: NONE SEEN /HPF (ref 0–5)
Squamous Epithelial: <1

## 2012-05-18 LAB — COMPREHENSIVE METABOLIC PANEL
Albumin: 3.4 g/dL (ref 3.4–5.0)
Alkaline Phosphatase: 121 U/L (ref 50–136)
BUN: 11 mg/dL (ref 7–18)
EGFR (African American): >60
Glucose: 996 mg/dL (ref 65–99)
SGOT(AST): 14 U/L — ABNORMAL LOW (ref 15–37)
SGPT (ALT): 12 U/L (ref 12–78)
Total Protein: 7.5 g/dL (ref 6.4–8.2)

## 2012-05-18 LAB — CBC
HGB: 13.3 g/dL (ref 13.0–18.0)
MCH: 26.8 pg (ref 26.0–34.0)
MCHC: 32.8 g/dL (ref 32.0–36.0)
RDW: 13.9 % (ref 11.5–14.5)

## 2012-05-19 LAB — BASIC METABOLIC PANEL
Anion Gap: 9 (ref 7–16)
Calcium, Total: 9.1 mg/dL (ref 8.5–10.1)
Chloride: 100 mmol/L (ref 98–107)
Co2: 28 mmol/L (ref 21–32)
Osmolality: 297 (ref 275–301)

## 2012-05-26 ENCOUNTER — Emergency Department: Payer: Self-pay | Admitting: *Deleted

## 2012-05-26 LAB — URINALYSIS, COMPLETE
Blood: NEGATIVE
Leukocyte Esterase: NEGATIVE
Nitrite: NEGATIVE
Ph: 6 (ref 4.5–8.0)
Specific Gravity: 1.024 (ref 1.003–1.030)
Squamous Epithelial: <1

## 2012-05-26 LAB — COMPREHENSIVE METABOLIC PANEL
Albumin: 3.2 g/dL — ABNORMAL LOW (ref 3.4–5.0)
Alkaline Phosphatase: 106 U/L (ref 50–136)
Bilirubin,Total: 0.3 mg/dL (ref 0.2–1.0)
Calcium, Total: 8.9 mg/dL (ref 8.5–10.1)
Chloride: 96 mmol/L — ABNORMAL LOW (ref 98–107)
EGFR (African American): >60
EGFR (Non-African Amer.): >60
Potassium: 3.9 mmol/L (ref 3.5–5.1)
SGOT(AST): 12 U/L — ABNORMAL LOW (ref 15–37)

## 2012-05-26 LAB — CBC
MCHC: 32.3 g/dL (ref 32.0–36.0)
RDW: 13.9 % (ref 11.5–14.5)

## 2012-06-19 ENCOUNTER — Emergency Department: Payer: Self-pay | Admitting: Emergency Medicine

## 2012-06-19 LAB — COMPREHENSIVE METABOLIC PANEL
Alkaline Phosphatase: 122 U/L (ref 50–136)
BUN: 12 mg/dL (ref 7–18)
Chloride: 95 mmol/L — ABNORMAL LOW (ref 98–107)
EGFR (African American): >60
EGFR (Non-African Amer.): >60
SGOT(AST): 16 U/L (ref 15–37)
SGPT (ALT): 13 U/L (ref 12–78)
Sodium: 131 mmol/L — ABNORMAL LOW (ref 136–145)
Total Protein: 7.2 g/dL (ref 6.4–8.2)

## 2012-06-19 LAB — URINALYSIS, COMPLETE
Blood: NEGATIVE
Leukocyte Esterase: NEGATIVE
Nitrite: NEGATIVE
Protein: NEGATIVE
RBC,UR: NONE SEEN /HPF (ref 0–5)
WBC UR: <1 /HPF (ref 0–5)

## 2012-06-19 LAB — CBC
HCT: 41.9 % (ref 40.0–52.0)
HGB: 13.7 g/dL (ref 13.0–18.0)
MCH: 26.1 pg (ref 26.0–34.0)
MCV: 80 fL (ref 80–100)
RBC: 5.27 10*6/uL (ref 4.40–5.90)
WBC: 9.3 10*3/uL (ref 3.8–10.6)

## 2012-07-02 ENCOUNTER — Emergency Department: Payer: Self-pay | Admitting: *Deleted

## 2012-07-02 LAB — CBC WITH DIFFERENTIAL/PLATELET
Basophil #: 0.1 10*3/uL (ref 0.0–0.1)
Eosinophil #: 0.1 10*3/uL (ref 0.0–0.7)
Eosinophil %: 1.2 %
HCT: 44.5 % (ref 40.0–52.0)
Lymphocyte #: 1.5 10*3/uL (ref 1.0–3.6)
Lymphocyte %: 12.2 %
MCH: 25.9 pg — ABNORMAL LOW (ref 26.0–34.0)
MCV: 78 fL — ABNORMAL LOW (ref 80–100)
Monocyte %: 5.1 %
Neutrophil #: 9.8 10*3/uL — ABNORMAL HIGH (ref 1.4–6.5)
RBC: 5.72 10*6/uL (ref 4.40–5.90)
RDW: 13.8 % (ref 11.5–14.5)
WBC: 12.1 10*3/uL — ABNORMAL HIGH (ref 3.8–10.6)

## 2012-07-02 LAB — URINALYSIS, COMPLETE
Blood: NEGATIVE
Leukocyte Esterase: NEGATIVE
Nitrite: NEGATIVE
Ph: 6 (ref 4.5–8.0)
Protein: NEGATIVE

## 2012-07-02 LAB — BASIC METABOLIC PANEL
Anion Gap: 18 — ABNORMAL HIGH (ref 7–16)
BUN: 14 mg/dL (ref 7–18)
Calcium, Total: 9 mg/dL (ref 8.5–10.1)
Chloride: 98 mmol/L (ref 98–107)
Co2: 20 mmol/L — ABNORMAL LOW (ref 21–32)
EGFR (Non-African Amer.): >60
Glucose: 448 mg/dL — ABNORMAL HIGH (ref 65–99)
Osmolality: 292 (ref 275–301)
Potassium: 4.2 mmol/L (ref 3.5–5.1)

## 2012-07-05 ENCOUNTER — Inpatient Hospital Stay: Payer: Self-pay | Admitting: Specialist

## 2012-07-05 LAB — COMPREHENSIVE METABOLIC PANEL
Alkaline Phosphatase: 135 U/L (ref 50–136)
Bilirubin,Total: 0.5 mg/dL (ref 0.2–1.0)
Calcium, Total: 9.4 mg/dL (ref 8.5–10.1)
Chloride: 92 mmol/L — ABNORMAL LOW (ref 98–107)
Co2: 20 mmol/L — ABNORMAL LOW (ref 21–32)
Creatinine: 1.09 mg/dL (ref 0.60–1.30)
EGFR (Non-African Amer.): >60
Osmolality: 307 (ref 275–301)
Potassium: 4.7 mmol/L (ref 3.5–5.1)
SGOT(AST): 10 U/L — ABNORMAL LOW (ref 15–37)
SGPT (ALT): 15 U/L (ref 12–78)
Sodium: 130 mmol/L — ABNORMAL LOW (ref 136–145)

## 2012-07-05 LAB — CBC
HCT: 45.7 % (ref 40.0–52.0)
HGB: 14.6 g/dL (ref 13.0–18.0)
MCH: 26.1 pg (ref 26.0–34.0)
MCV: 82 fL (ref 80–100)
Platelet: 113 10*3/uL — ABNORMAL LOW (ref 150–440)
RBC: 5.57 10*6/uL (ref 4.40–5.90)
WBC: 10.5 10*3/uL (ref 3.8–10.6)

## 2012-07-05 LAB — URINALYSIS, COMPLETE
Bacteria: NONE SEEN
Bilirubin,UR: NEGATIVE
Glucose,UR: >=500 mg/dL (ref 0–75)
Nitrite: NEGATIVE
Protein: NEGATIVE
RBC,UR: NONE SEEN /HPF (ref 0–5)
Specific Gravity: 1.023 (ref 1.003–1.030)
Squamous Epithelial: <1
WBC UR: <1 /HPF (ref 0–5)

## 2012-07-06 LAB — BASIC METABOLIC PANEL
BUN: 11 mg/dL (ref 7–18)
Calcium, Total: 9.2 mg/dL (ref 8.5–10.1)
Calcium, Total: 9.1 mg/dL (ref 8.5–10.1)
Chloride: 112 mmol/L — ABNORMAL HIGH (ref 98–107)
Co2: 25 mmol/L (ref 21–32)
Creatinine: 0.63 mg/dL (ref 0.60–1.30)
EGFR (African American): >60
EGFR (African American): >60
EGFR (Non-African Amer.): >60
EGFR (Non-African Amer.): >60
Glucose: 215 mg/dL — ABNORMAL HIGH (ref 65–99)
Glucose: 128 mg/dL — ABNORMAL HIGH (ref 65–99)
Osmolality: 296 (ref 275–301)
Osmolality: 290 (ref 275–301)
Potassium: 3.3 mmol/L — ABNORMAL LOW (ref 3.5–5.1)
Sodium: 145 mmol/L (ref 136–145)

## 2012-07-06 LAB — MAGNESIUM: Magnesium: 1.8 mg/dL

## 2012-07-07 LAB — BASIC METABOLIC PANEL
BUN: 9 mg/dL (ref 7–18)
Calcium, Total: 8.3 mg/dL — ABNORMAL LOW (ref 8.5–10.1)
Chloride: 109 mmol/L — ABNORMAL HIGH (ref 98–107)
Creatinine: 0.81 mg/dL (ref 0.60–1.30)
Potassium: 3.9 mmol/L (ref 3.5–5.1)
Sodium: 142 mmol/L (ref 136–145)

## 2012-07-28 ENCOUNTER — Emergency Department: Payer: Self-pay | Admitting: Emergency Medicine

## 2012-08-12 LAB — CBC WITH DIFFERENTIAL/PLATELET
Basophil #: 0.1 10*3/uL (ref 0.0–0.1)
Eosinophil %: 2.1 %
HGB: 14.2 g/dL (ref 13.0–18.0)
Lymphocyte %: 22.5 %
MCH: 27 pg (ref 26.0–34.0)
MCV: 80 fL (ref 80–100)
Monocyte %: 7 %
Neutrophil #: 5.2 10*3/uL (ref 1.4–6.5)
Platelet: 116 10*3/uL — ABNORMAL LOW (ref 150–440)
RBC: 5.25 10*6/uL (ref 4.40–5.90)
RDW: 14 % (ref 11.5–14.5)
WBC: 7.8 10*3/uL (ref 3.8–10.6)

## 2012-08-13 ENCOUNTER — Inpatient Hospital Stay: Payer: Self-pay | Admitting: Internal Medicine

## 2012-08-13 LAB — COMPREHENSIVE METABOLIC PANEL
Albumin: 3.4 g/dL (ref 3.4–5.0)
Alkaline Phosphatase: 129 U/L (ref 50–136)
BUN: 10 mg/dL (ref 7–18)
Calcium, Total: 8.6 mg/dL (ref 8.5–10.1)
Chloride: 95 mmol/L — ABNORMAL LOW (ref 98–107)
Creatinine: 0.96 mg/dL (ref 0.60–1.30)
Glucose: 711 mg/dL (ref 65–99)
Potassium: 3.7 mmol/L (ref 3.5–5.1)
SGOT(AST): 10 U/L — ABNORMAL LOW (ref 15–37)
SGPT (ALT): 13 U/L (ref 12–78)
Sodium: 133 mmol/L — ABNORMAL LOW (ref 136–145)

## 2012-08-13 LAB — URINALYSIS, COMPLETE
Bacteria: NONE SEEN
Bilirubin,UR: NEGATIVE
Blood: NEGATIVE
Glucose,UR: >=500 mg/dL (ref 0–75)
Specific Gravity: 1.026 (ref 1.003–1.030)
Squamous Epithelial: NONE SEEN
WBC UR: NONE SEEN /HPF (ref 0–5)

## 2012-08-14 LAB — BASIC METABOLIC PANEL
Anion Gap: 8 (ref 7–16)
BUN: 5 mg/dL — ABNORMAL LOW (ref 7–18)
Calcium, Total: 8.4 mg/dL — ABNORMAL LOW (ref 8.5–10.1)
Chloride: 103 mmol/L (ref 98–107)
Co2: 27 mmol/L (ref 21–32)
EGFR (Non-African Amer.): >60
Osmolality: 281 (ref 275–301)
Sodium: 138 mmol/L (ref 136–145)

## 2012-10-02 ENCOUNTER — Emergency Department: Payer: Self-pay | Admitting: Emergency Medicine

## 2012-10-02 LAB — URINALYSIS, COMPLETE
Bacteria: NONE SEEN
Bilirubin,UR: NEGATIVE
Blood: NEGATIVE
Glucose,UR: >=500 mg/dL (ref 0–75)
Leukocyte Esterase: NEGATIVE
Nitrite: NEGATIVE
Ph: 8 (ref 4.5–8.0)
RBC,UR: NONE SEEN /HPF (ref 0–5)
Specific Gravity: 1.021 (ref 1.003–1.030)
Squamous Epithelial: <1
WBC UR: <1 /HPF (ref 0–5)

## 2012-10-02 LAB — CBC
HCT: 35.3 % — ABNORMAL LOW (ref 40.0–52.0)
HGB: 12 g/dL — ABNORMAL LOW (ref 13.0–18.0)
MCHC: 33.9 g/dL (ref 32.0–36.0)
RDW: 13.7 % (ref 11.5–14.5)
WBC: 8.8 10*3/uL (ref 3.8–10.6)

## 2012-10-02 LAB — COMPREHENSIVE METABOLIC PANEL
Alkaline Phosphatase: 104 U/L (ref 50–136)
Anion Gap: 10 (ref 7–16)
Bilirubin,Total: 0.3 mg/dL (ref 0.2–1.0)
Chloride: 99 mmol/L (ref 98–107)
Co2: 26 mmol/L (ref 21–32)
Creatinine: 0.84 mg/dL (ref 0.60–1.30)
EGFR (Non-African Amer.): >60
Osmolality: 298 (ref 275–301)
Potassium: 3.8 mmol/L (ref 3.5–5.1)
Sodium: 135 mmol/L — ABNORMAL LOW (ref 136–145)
Total Protein: 6.7 g/dL (ref 6.4–8.2)

## 2012-10-02 LAB — TROPONIN I: Troponin-I: <0.02 ng/mL

## 2012-10-19 ENCOUNTER — Emergency Department: Payer: Self-pay | Admitting: Emergency Medicine

## 2012-10-19 LAB — URINALYSIS, COMPLETE
Bacteria: NONE SEEN
Bilirubin,UR: NEGATIVE
Blood: NEGATIVE
Ketone: NEGATIVE
Leukocyte Esterase: NEGATIVE
Nitrite: NEGATIVE
RBC,UR: NONE SEEN /HPF (ref 0–5)
Specific Gravity: 1.009 (ref 1.003–1.030)
Squamous Epithelial: 1
WBC UR: 1 /HPF (ref 0–5)

## 2012-10-19 LAB — COMPREHENSIVE METABOLIC PANEL
Albumin: 3.7 g/dL (ref 3.4–5.0)
Alkaline Phosphatase: 124 U/L (ref 50–136)
Anion Gap: 10 (ref 7–16)
BUN: 12 mg/dL (ref 7–18)
Bilirubin,Total: 0.4 mg/dL (ref 0.2–1.0)
Calcium, Total: 9 mg/dL (ref 8.5–10.1)
Chloride: 99 mmol/L (ref 98–107)
Co2: 29 mmol/L (ref 21–32)
Creatinine: 1.09 mg/dL (ref 0.60–1.30)
EGFR (African American): >60
Potassium: 3.9 mmol/L (ref 3.5–5.1)
SGOT(AST): 9 U/L — ABNORMAL LOW (ref 15–37)
Sodium: 138 mmol/L (ref 136–145)
Total Protein: 7.7 g/dL (ref 6.4–8.2)

## 2012-10-19 LAB — CBC
HCT: 41.1 % (ref 40.0–52.0)
HGB: 13.7 g/dL (ref 13.0–18.0)
MCH: 27.2 pg (ref 26.0–34.0)
MCV: 81 fL (ref 80–100)
RDW: 13.2 % (ref 11.5–14.5)
WBC: 6.9 10*3/uL (ref 3.8–10.6)

## 2012-10-19 LAB — LIPASE, BLOOD: Lipase: 141 U/L (ref 73–393)

## 2012-10-26 ENCOUNTER — Emergency Department: Payer: Self-pay | Admitting: Emergency Medicine

## 2012-10-26 LAB — COMPREHENSIVE METABOLIC PANEL
Albumin: 3.7 g/dL (ref 3.4–5.0)
Alkaline Phosphatase: 123 U/L (ref 50–136)
Anion Gap: 11 (ref 7–16)
BUN: 9 mg/dL (ref 7–18)
Bilirubin,Total: 0.4 mg/dL (ref 0.2–1.0)
Calcium, Total: 9 mg/dL (ref 8.5–10.1)
Chloride: 96 mmol/L — ABNORMAL LOW (ref 98–107)
Co2: 26 mmol/L (ref 21–32)
Creatinine: 1.02 mg/dL (ref 0.60–1.30)
EGFR (African American): >60
EGFR (Non-African Amer.): >60
Glucose: 717 mg/dL (ref 65–99)
Osmolality: 299 (ref 275–301)
Potassium: 4.1 mmol/L (ref 3.5–5.1)
SGOT(AST): 8 U/L — ABNORMAL LOW (ref 15–37)
SGPT (ALT): 16 U/L (ref 12–78)
Sodium: 133 mmol/L — ABNORMAL LOW (ref 136–145)
Total Protein: 7.7 g/dL (ref 6.4–8.2)

## 2012-10-26 LAB — TROPONIN I: Troponin-I: <0.02 ng/mL

## 2012-10-26 LAB — URINALYSIS, COMPLETE
Bacteria: NONE SEEN
Bilirubin,UR: NEGATIVE
Ketone: NEGATIVE
Leukocyte Esterase: NEGATIVE
Nitrite: NEGATIVE
Ph: 7 (ref 4.5–8.0)
RBC,UR: NONE SEEN /HPF (ref 0–5)
Specific Gravity: 1.023 (ref 1.003–1.030)
Squamous Epithelial: <1
WBC UR: <1 /HPF (ref 0–5)

## 2012-10-26 LAB — CBC
MCH: 26.5 pg (ref 26.0–34.0)
MCHC: 32.4 g/dL (ref 32.0–36.0)
MCV: 82 fL (ref 80–100)
Platelet: 121 10*3/uL — ABNORMAL LOW (ref 150–440)
RDW: 13.1 % (ref 11.5–14.5)
WBC: 7.5 10*3/uL (ref 3.8–10.6)

## 2012-10-26 LAB — LIPASE, BLOOD: Lipase: 141 U/L (ref 73–393)

## 2012-11-12 ENCOUNTER — Emergency Department: Payer: Self-pay | Admitting: Emergency Medicine

## 2012-11-12 LAB — CBC WITH DIFFERENTIAL/PLATELET
Basophil #: 0.1 10*3/uL (ref 0.0–0.1)
Basophil %: 1.3 %
Eosinophil %: 2.5 %
HCT: 39.3 % — ABNORMAL LOW (ref 40.0–52.0)
HGB: 12.6 g/dL — ABNORMAL LOW (ref 13.0–18.0)
Lymphocyte #: 2.7 10*3/uL (ref 1.0–3.6)
MCH: 25.5 pg — ABNORMAL LOW (ref 26.0–34.0)
MCHC: 32 g/dL (ref 32.0–36.0)
MCV: 80 fL (ref 80–100)
Monocyte #: 0.6 x10 3/mm (ref 0.2–1.0)
Monocyte %: 5.4 %
Neutrophil #: 6.9 10*3/uL — ABNORMAL HIGH (ref 1.4–6.5)
Neutrophil %: 65.4 %
Platelet: 119 10*3/uL — ABNORMAL LOW (ref 150–440)
RDW: 13.2 % (ref 11.5–14.5)
WBC: 10.5 10*3/uL (ref 3.8–10.6)

## 2012-11-12 LAB — URINALYSIS, COMPLETE
Bilirubin,UR: NEGATIVE
Blood: NEGATIVE
Glucose,UR: >=500 mg/dL (ref 0–75)
Leukocyte Esterase: NEGATIVE
Nitrite: NEGATIVE
Protein: NEGATIVE
RBC,UR: NONE SEEN /HPF (ref 0–5)
Specific Gravity: 1.024 (ref 1.003–1.030)
Squamous Epithelial: <1
WBC UR: NONE SEEN /HPF (ref 0–5)

## 2012-11-12 LAB — COMPREHENSIVE METABOLIC PANEL
Albumin: 4.6 g/dL (ref 3.4–5.0)
Alkaline Phosphatase: 143 U/L — ABNORMAL HIGH (ref 50–136)
BUN: 19 mg/dL — ABNORMAL HIGH (ref 7–18)
Calcium, Total: 9.7 mg/dL (ref 8.5–10.1)
Chloride: 93 mmol/L — ABNORMAL LOW (ref 98–107)
Co2: 24 mmol/L (ref 21–32)
Creatinine: 0.73 mg/dL (ref 0.60–1.30)
EGFR (Non-African Amer.): >60
Glucose: 528 mg/dL (ref 65–99)
Osmolality: 283 (ref 275–301)
Potassium: 4.5 mmol/L (ref 3.5–5.1)
SGOT(AST): 16 U/L (ref 15–37)
Sodium: 128 mmol/L — ABNORMAL LOW (ref 136–145)

## 2012-11-16 ENCOUNTER — Inpatient Hospital Stay: Payer: Self-pay | Admitting: Internal Medicine

## 2012-11-16 LAB — URINALYSIS, COMPLETE
Blood: NEGATIVE
Nitrite: NEGATIVE
Ph: 5 (ref 4.5–8.0)
Protein: NEGATIVE
RBC,UR: NONE SEEN /HPF (ref 0–5)
WBC UR: NONE SEEN /HPF (ref 0–5)

## 2012-11-16 LAB — BASIC METABOLIC PANEL
BUN: 15 mg/dL (ref 7–18)
EGFR (Non-African Amer.): >60
Glucose: 200 mg/dL — ABNORMAL HIGH (ref 65–99)
Sodium: 140 mmol/L (ref 136–145)

## 2012-11-16 LAB — COMPREHENSIVE METABOLIC PANEL
Alkaline Phosphatase: 110 U/L (ref 50–136)
Anion Gap: 20 — ABNORMAL HIGH (ref 7–16)
Bilirubin,Total: 0.6 mg/dL (ref 0.2–1.0)
Calcium, Total: 9.3 mg/dL (ref 8.5–10.1)
Chloride: 97 mmol/L — ABNORMAL LOW (ref 98–107)
Creatinine: 1.21 mg/dL (ref 0.60–1.30)
EGFR (Non-African Amer.): >60
Glucose: 641 mg/dL (ref 65–99)
Potassium: 4.2 mmol/L (ref 3.5–5.1)
SGOT(AST): 14 U/L — ABNORMAL LOW (ref 15–37)
Sodium: 132 mmol/L — ABNORMAL LOW (ref 136–145)

## 2012-11-16 LAB — CBC
HGB: 14.8 g/dL (ref 13.0–18.0)
MCHC: 33 g/dL (ref 32.0–36.0)
RBC: 5.54 10*6/uL (ref 4.40–5.90)
RDW: 13.3 % (ref 11.5–14.5)

## 2012-11-16 LAB — TROPONIN I: Troponin-I: <0.02 ng/mL

## 2012-11-17 LAB — CBC WITH DIFFERENTIAL/PLATELET
Basophil %: 0.7 %
Eosinophil #: 0.2 10*3/uL (ref 0.0–0.7)
Eosinophil %: 3.1 %
HCT: 38.3 % — ABNORMAL LOW (ref 40.0–52.0)
HGB: 12.8 g/dL — ABNORMAL LOW (ref 13.0–18.0)
Lymphocyte %: 30.4 %
MCH: 26.1 pg (ref 26.0–34.0)
MCV: 78 fL — ABNORMAL LOW (ref 80–100)
Monocyte #: 0.8 x10 3/mm (ref 0.2–1.0)
Neutrophil #: 4 10*3/uL (ref 1.4–6.5)
Platelet: 105 10*3/uL — ABNORMAL LOW (ref 150–440)
RDW: 13.3 % (ref 11.5–14.5)
WBC: 7.3 10*3/uL (ref 3.8–10.6)

## 2012-11-17 LAB — HEMOGLOBIN A1C: Hemoglobin A1C: 14.5 % — ABNORMAL HIGH (ref 4.2–6.3)

## 2012-11-17 LAB — BASIC METABOLIC PANEL
Anion Gap: 11 (ref 7–16)
Calcium, Total: 8.5 mg/dL (ref 8.5–10.1)
Chloride: 110 mmol/L — ABNORMAL HIGH (ref 98–107)
Co2: 22 mmol/L (ref 21–32)
Creatinine: 0.9 mg/dL (ref 0.60–1.30)
EGFR (Non-African Amer.): >60

## 2013-01-05 ENCOUNTER — Inpatient Hospital Stay: Payer: Self-pay | Admitting: Internal Medicine

## 2013-01-05 LAB — COMPREHENSIVE METABOLIC PANEL
Albumin: 3.6 g/dL (ref 3.4–5.0)
Chloride: 93 mmol/L — ABNORMAL LOW (ref 98–107)
Co2: 27 mmol/L (ref 21–32)
Creatinine: 1.44 mg/dL — ABNORMAL HIGH (ref 0.60–1.30)
EGFR (Non-African Amer.): >60
Glucose: 897 mg/dL (ref 65–99)
Potassium: 4.7 mmol/L (ref 3.5–5.1)
SGOT(AST): 6 U/L — ABNORMAL LOW (ref 15–37)
Sodium: 128 mmol/L — ABNORMAL LOW (ref 136–145)
Total Protein: 8.1 g/dL (ref 6.4–8.2)

## 2013-01-05 LAB — URINALYSIS, COMPLETE
Glucose,UR: >=500 mg/dL (ref 0–75)
Nitrite: NEGATIVE
Ph: 7 (ref 4.5–8.0)
Protein: NEGATIVE
Specific Gravity: 1.023 (ref 1.003–1.030)
Squamous Epithelial: <1
WBC UR: NONE SEEN /HPF (ref 0–5)

## 2013-01-05 LAB — BASIC METABOLIC PANEL
Anion Gap: 5 — ABNORMAL LOW (ref 7–16)
Chloride: 96 mmol/L — ABNORMAL LOW (ref 98–107)
EGFR (African American): >60
EGFR (Non-African Amer.): >60
Osmolality: 297 (ref 275–301)

## 2013-01-05 LAB — CBC
HCT: 44.3 % (ref 40.0–52.0)
HGB: 14.5 g/dL (ref 13.0–18.0)
MCH: 26.6 pg (ref 26.0–34.0)
MCHC: 32.7 g/dL (ref 32.0–36.0)
Platelet: 135 10*3/uL — ABNORMAL LOW (ref 150–440)
RDW: 13 % (ref 11.5–14.5)
WBC: 8.2 10*3/uL (ref 3.8–10.6)

## 2013-01-05 LAB — BETA-HYDROXYBUTYRIC ACID: Beta-Hydroxybutyrate: 6.01 mg/dL — ABNORMAL HIGH (ref 0.2–2.8)

## 2013-01-05 LAB — HEMOGLOBIN A1C: Hemoglobin A1C: 15.2 % — ABNORMAL HIGH (ref 4.2–6.3)

## 2013-01-06 LAB — BASIC METABOLIC PANEL
Anion Gap: 9 (ref 7–16)
BUN: 15 mg/dL (ref 7–18)
Co2: 25 mmol/L (ref 21–32)
Creatinine: 0.69 mg/dL (ref 0.60–1.30)
EGFR (African American): >60
EGFR (Non-African Amer.): >60
Osmolality: 285 (ref 275–301)
Potassium: 3.3 mmol/L — ABNORMAL LOW (ref 3.5–5.1)

## 2013-01-07 LAB — BASIC METABOLIC PANEL
BUN: 11 mg/dL (ref 7–18)
Calcium, Total: 8.5 mg/dL (ref 8.5–10.1)
Chloride: 103 mmol/L (ref 98–107)
Creatinine: 0.61 mg/dL (ref 0.60–1.30)
Glucose: 251 mg/dL — ABNORMAL HIGH (ref 65–99)
Osmolality: 280 (ref 275–301)
Potassium: 3.7 mmol/L (ref 3.5–5.1)
Sodium: 136 mmol/L (ref 136–145)

## 2013-02-14 ENCOUNTER — Emergency Department: Payer: Self-pay | Admitting: Emergency Medicine

## 2013-02-14 LAB — COMPREHENSIVE METABOLIC PANEL
Albumin: 3.7 g/dL (ref 3.4–5.0)
Anion Gap: 9 (ref 7–16)
Calcium, Total: 9 mg/dL (ref 8.5–10.1)
Co2: 28 mmol/L (ref 21–32)
EGFR (African American): >60
Osmolality: 284 (ref 275–301)
SGOT(AST): 8 U/L — ABNORMAL LOW (ref 15–37)
SGPT (ALT): 16 U/L (ref 12–78)
Sodium: 131 mmol/L — ABNORMAL LOW (ref 136–145)
Total Protein: 8.3 g/dL — ABNORMAL HIGH (ref 6.4–8.2)

## 2013-02-14 LAB — CBC
HCT: 42.8 % (ref 40.0–52.0)
HGB: 14.7 g/dL (ref 13.0–18.0)
MCH: 26.3 pg (ref 26.0–34.0)
MCV: 76 fL — ABNORMAL LOW (ref 80–100)
Platelet: 153 10*3/uL (ref 150–440)

## 2013-04-13 ENCOUNTER — Inpatient Hospital Stay: Payer: Self-pay | Admitting: Student

## 2013-04-13 LAB — COMPREHENSIVE METABOLIC PANEL
Anion Gap: 9 (ref 7–16)
Calcium, Total: 9 mg/dL (ref 8.5–10.1)
Creatinine: 1.12 mg/dL (ref 0.60–1.30)
EGFR (African American): >60
EGFR (Non-African Amer.): >60
SGOT(AST): 11 U/L — ABNORMAL LOW (ref 15–37)
SGPT (ALT): 15 U/L (ref 12–78)
Sodium: 126 mmol/L — ABNORMAL LOW (ref 136–145)
Total Protein: 7.2 g/dL (ref 6.4–8.2)

## 2013-04-13 LAB — BASIC METABOLIC PANEL
Anion Gap: 8 (ref 7–16)
BUN: 13 mg/dL (ref 7–18)
Calcium, Total: 8.4 mg/dL — ABNORMAL LOW (ref 8.5–10.1)
Co2: 26 mmol/L (ref 21–32)
EGFR (African American): >60
Glucose: 319 mg/dL — ABNORMAL HIGH (ref 65–99)
Osmolality: 292 (ref 275–301)
Sodium: 140 mmol/L (ref 136–145)

## 2013-04-13 LAB — CBC
HGB: 13.1 g/dL (ref 13.0–18.0)
MCH: 27.1 pg (ref 26.0–34.0)
MCHC: 33.1 g/dL (ref 32.0–36.0)
RBC: 4.82 10*6/uL (ref 4.40–5.90)

## 2013-04-14 LAB — COMPREHENSIVE METABOLIC PANEL
Alkaline Phosphatase: 107 U/L (ref 50–136)
Anion Gap: 5 — ABNORMAL LOW (ref 7–16)
Bilirubin,Total: 0.5 mg/dL (ref 0.2–1.0)
Calcium, Total: 8.8 mg/dL (ref 8.5–10.1)
Chloride: 106 mmol/L (ref 98–107)
Creatinine: 0.69 mg/dL (ref 0.60–1.30)
EGFR (African American): >60
EGFR (Non-African Amer.): >60
Potassium: 3.3 mmol/L — ABNORMAL LOW (ref 3.5–5.1)
Sodium: 143 mmol/L (ref 136–145)

## 2013-04-14 LAB — CBC WITH DIFFERENTIAL/PLATELET
Basophil %: 1 %
Eosinophil #: 0.3 10*3/uL (ref 0.0–0.7)
Eosinophil %: 3 %
HCT: 37.7 % — ABNORMAL LOW (ref 40.0–52.0)
HGB: 12.9 g/dL — ABNORMAL LOW (ref 13.0–18.0)
Lymphocyte %: 35.4 %
MCH: 26.5 pg (ref 26.0–34.0)
MCHC: 34.4 g/dL (ref 32.0–36.0)
MCV: 77 fL — ABNORMAL LOW (ref 80–100)
Monocyte #: 0.7 x10 3/mm (ref 0.2–1.0)
Monocyte %: 7 %
Neutrophil #: 5.3 10*3/uL (ref 1.4–6.5)
Neutrophil %: 53.6 %
Platelet: 129 10*3/uL — ABNORMAL LOW (ref 150–440)
RBC: 4.89 10*6/uL (ref 4.40–5.90)
RDW: 13 % (ref 11.5–14.5)
WBC: 9.9 10*3/uL (ref 3.8–10.6)

## 2013-04-15 LAB — CBC WITH DIFFERENTIAL/PLATELET
Basophil %: 0.9 %
HGB: 12.7 g/dL — ABNORMAL LOW (ref 13.0–18.0)
Lymphocyte #: 1.3 10*3/uL (ref 1.0–3.6)
MCH: 26.9 pg (ref 26.0–34.0)
MCHC: 34.6 g/dL (ref 32.0–36.0)
MCV: 78 fL — ABNORMAL LOW (ref 80–100)
Monocyte %: 8.6 %
Neutrophil #: 4.2 10*3/uL (ref 1.4–6.5)
Neutrophil %: 67.8 %
RBC: 4.71 10*6/uL (ref 4.40–5.90)
RDW: 13 % (ref 11.5–14.5)
WBC: 6.2 10*3/uL (ref 3.8–10.6)

## 2013-04-21 ENCOUNTER — Inpatient Hospital Stay: Payer: Self-pay | Admitting: Internal Medicine

## 2013-04-21 LAB — COMPREHENSIVE METABOLIC PANEL
Alkaline Phosphatase: 145 U/L — ABNORMAL HIGH (ref 50–136)
Anion Gap: 24 — ABNORMAL HIGH (ref 7–16)
Bilirubin,Total: 0.8 mg/dL (ref 0.2–1.0)
Calcium, Total: 10.4 mg/dL — ABNORMAL HIGH (ref 8.5–10.1)
Co2: 9 mmol/L — CL (ref 21–32)
Creatinine: 1.25 mg/dL (ref 0.60–1.30)
EGFR (Non-African Amer.): >60
Glucose: 631 mg/dL (ref 65–99)
Osmolality: 307 (ref 275–301)
Potassium: 4.4 mmol/L (ref 3.5–5.1)
Sodium: 137 mmol/L (ref 136–145)

## 2013-04-21 LAB — URINALYSIS, COMPLETE
Bilirubin,UR: NEGATIVE
Glucose,UR: >=500 mg/dL (ref 0–75)
Leukocyte Esterase: NEGATIVE
Nitrite: NEGATIVE
Protein: 30
Specific Gravity: 1.018 (ref 1.003–1.030)
Squamous Epithelial: <1
WBC UR: <1 /HPF (ref 0–5)

## 2013-04-21 LAB — CBC
HCT: 45.3 % (ref 40.0–52.0)
MCHC: 31.9 g/dL — ABNORMAL LOW (ref 32.0–36.0)
Platelet: 193 10*3/uL (ref 150–440)
RBC: 5.57 10*6/uL (ref 4.40–5.90)
RDW: 13.5 % (ref 11.5–14.5)
WBC: 26.6 10*3/uL — ABNORMAL HIGH (ref 3.8–10.6)

## 2013-04-21 LAB — TROPONIN I: Troponin-I: <0.02 ng/mL

## 2013-04-22 LAB — COMPREHENSIVE METABOLIC PANEL
Alkaline Phosphatase: 137 U/L — ABNORMAL HIGH (ref 50–136)
EGFR (African American): >60
Glucose: 184 mg/dL — ABNORMAL HIGH (ref 65–99)
Osmolality: 304 (ref 275–301)
SGPT (ALT): 11 U/L — ABNORMAL LOW (ref 12–78)
Sodium: 150 mmol/L — ABNORMAL HIGH (ref 136–145)
Total Protein: 8.5 g/dL — ABNORMAL HIGH (ref 6.4–8.2)

## 2013-04-22 LAB — CBC WITH DIFFERENTIAL/PLATELET
Basophil #: 0.1 10*3/uL (ref 0.0–0.1)
Eosinophil #: 0 10*3/uL (ref 0.0–0.7)
Eosinophil %: 0 %
HCT: 40.4 % (ref 40.0–52.0)
Lymphocyte #: 1.1 10*3/uL (ref 1.0–3.6)
Lymphocyte %: 4.9 %
MCH: 26.7 pg (ref 26.0–34.0)
MCHC: 34.3 g/dL (ref 32.0–36.0)
MCV: 78 fL — ABNORMAL LOW (ref 80–100)
Monocyte %: 12.9 %
Neutrophil #: 19.3 10*3/uL — ABNORMAL HIGH (ref 1.4–6.5)
WBC: 23.5 10*3/uL — ABNORMAL HIGH (ref 3.8–10.6)

## 2013-04-22 LAB — BASIC METABOLIC PANEL
Anion Gap: 21 — ABNORMAL HIGH (ref 7–16)
Anion Gap: 9 (ref 7–16)
Anion Gap: 9 (ref 7–16)
BUN: 20 mg/dL — ABNORMAL HIGH (ref 7–18)
BUN: 18 mg/dL (ref 7–18)
BUN: 14 mg/dL (ref 7–18)
Calcium, Total: 10.4 mg/dL — ABNORMAL HIGH (ref 8.5–10.1)
Calcium, Total: 10.3 mg/dL — ABNORMAL HIGH (ref 8.5–10.1)
Calcium, Total: 9.9 mg/dL (ref 8.5–10.1)
Chloride: 120 mmol/L — ABNORMAL HIGH (ref 98–107)
Chloride: 120 mmol/L — ABNORMAL HIGH (ref 98–107)
Co2: 19 mmol/L — ABNORMAL LOW (ref 21–32)
Co2: 20 mmol/L — ABNORMAL LOW (ref 21–32)
Creatinine: 1.17 mg/dL (ref 0.60–1.30)
Creatinine: 1.09 mg/dL (ref 0.60–1.30)
EGFR (African American): >60
EGFR (African American): >60
EGFR (Non-African Amer.): >60
EGFR (Non-African Amer.): >60
Glucose: 219 mg/dL — ABNORMAL HIGH (ref 65–99)
Glucose: 124 mg/dL — ABNORMAL HIGH (ref 65–99)
Glucose: 107 mg/dL — ABNORMAL HIGH (ref 65–99)
Osmolality: 297 (ref 275–301)
Potassium: 3.9 mmol/L (ref 3.5–5.1)
Potassium: 3.5 mmol/L (ref 3.5–5.1)
Potassium: 3.3 mmol/L — ABNORMAL LOW (ref 3.5–5.1)
Sodium: 145 mmol/L (ref 136–145)
Sodium: 150 mmol/L — ABNORMAL HIGH (ref 136–145)

## 2013-04-22 LAB — TROPONIN I
Troponin-I: <0.02 ng/mL
Troponin-I: <0.02 ng/mL

## 2013-04-23 LAB — CBC WITH DIFFERENTIAL/PLATELET
Basophil #: 0 10*3/uL (ref 0.0–0.1)
Basophil %: 0.3 %
Eosinophil #: 0 10*3/uL (ref 0.0–0.7)
Eosinophil %: 0.2 %
HCT: 33.8 % — ABNORMAL LOW (ref 40.0–52.0)
HGB: 11.2 g/dL — ABNORMAL LOW (ref 13.0–18.0)
Lymphocyte %: 13.7 %
MCH: 26.2 pg (ref 26.0–34.0)
MCHC: 33.3 g/dL (ref 32.0–36.0)
MCV: 79 fL — ABNORMAL LOW (ref 80–100)
Monocyte #: 1.3 x10 3/mm — ABNORMAL HIGH (ref 0.2–1.0)
Monocyte %: 10.8 %
Neutrophil #: 8.8 10*3/uL — ABNORMAL HIGH (ref 1.4–6.5)
Platelet: 143 10*3/uL — ABNORMAL LOW (ref 150–440)
RDW: 13.2 % (ref 11.5–14.5)

## 2013-04-23 LAB — BASIC METABOLIC PANEL
Anion Gap: 12 (ref 7–16)
Calcium, Total: 9.3 mg/dL (ref 8.5–10.1)
Chloride: 113 mmol/L — ABNORMAL HIGH (ref 98–107)
Co2: 18 mmol/L — ABNORMAL LOW (ref 21–32)
Creatinine: 0.81 mg/dL (ref 0.60–1.30)
EGFR (Non-African Amer.): >60
Glucose: 333 mg/dL — ABNORMAL HIGH (ref 65–99)
Osmolality: 298 (ref 275–301)
Potassium: 3.9 mmol/L (ref 3.5–5.1)
Sodium: 143 mmol/L (ref 136–145)

## 2013-04-24 LAB — BASIC METABOLIC PANEL
Anion Gap: 9 (ref 7–16)
Chloride: 108 mmol/L — ABNORMAL HIGH (ref 98–107)
Co2: 21 mmol/L (ref 21–32)
EGFR (African American): >60
Glucose: 274 mg/dL — ABNORMAL HIGH (ref 65–99)
Osmolality: 284 (ref 275–301)

## 2013-04-24 LAB — CBC WITH DIFFERENTIAL/PLATELET
Basophil #: 0 10*3/uL (ref 0.0–0.1)
Basophil %: 0.4 %
Eosinophil #: 0.1 10*3/uL (ref 0.0–0.7)
Eosinophil %: 0.6 %
HCT: 32.8 % — ABNORMAL LOW (ref 40.0–52.0)
HGB: 11.5 g/dL — ABNORMAL LOW (ref 13.0–18.0)
Monocyte %: 8.8 %
Neutrophil %: 73.5 %
WBC: 9.8 10*3/uL (ref 3.8–10.6)

## 2013-06-19 ENCOUNTER — Emergency Department: Payer: Self-pay | Admitting: Emergency Medicine

## 2013-06-20 LAB — COMPREHENSIVE METABOLIC PANEL
Albumin: 3.6 g/dL (ref 3.4–5.0)
Anion Gap: 9 (ref 7–16)
Bilirubin,Total: 0.6 mg/dL (ref 0.2–1.0)
Calcium, Total: 9.6 mg/dL (ref 8.5–10.1)
Chloride: 100 mmol/L (ref 98–107)
Co2: 27 mmol/L (ref 21–32)
EGFR (Non-African Amer.): >60
Glucose: 335 mg/dL — ABNORMAL HIGH (ref 65–99)
Osmolality: 283 (ref 275–301)
Potassium: 3 mmol/L — ABNORMAL LOW (ref 3.5–5.1)
SGPT (ALT): 13 U/L (ref 12–78)
Sodium: 136 mmol/L (ref 136–145)
Total Protein: 7.8 g/dL (ref 6.4–8.2)

## 2013-06-20 LAB — CBC
HCT: 40.2 % (ref 40.0–52.0)
HGB: 13.9 g/dL (ref 13.0–18.0)
MCHC: 34.6 g/dL (ref 32.0–36.0)
MCV: 76 fL — ABNORMAL LOW (ref 80–100)
Platelet: 155 10*3/uL (ref 150–440)
RDW: 13.7 % (ref 11.5–14.5)

## 2013-07-19 ENCOUNTER — Emergency Department: Payer: Self-pay | Admitting: Emergency Medicine

## 2013-07-19 LAB — COMPREHENSIVE METABOLIC PANEL
Alkaline Phosphatase: 113 U/L (ref 50–136)
BUN: 10 mg/dL (ref 7–18)
Bilirubin,Total: 0.7 mg/dL (ref 0.2–1.0)
Calcium, Total: 9.1 mg/dL (ref 8.5–10.1)
Chloride: 90 mmol/L — ABNORMAL LOW (ref 98–107)
Co2: 28 mmol/L (ref 21–32)
Creatinine: 0.96 mg/dL (ref 0.60–1.30)
Potassium: 4 mmol/L (ref 3.5–5.1)
SGOT(AST): <5 U/L — ABNORMAL LOW (ref 15–37)
SGPT (ALT): 16 U/L (ref 12–78)
Sodium: 127 mmol/L — ABNORMAL LOW (ref 136–145)

## 2013-07-19 LAB — CBC
HCT: 39.9 % — ABNORMAL LOW (ref 40.0–52.0)
MCH: 26.8 pg (ref 26.0–34.0)
RDW: 13.9 % (ref 11.5–14.5)
WBC: 6.9 10*3/uL (ref 3.8–10.6)

## 2013-07-19 LAB — URINALYSIS, COMPLETE
Bacteria: NONE SEEN
Nitrite: NEGATIVE
RBC,UR: NONE SEEN /HPF (ref 0–5)
Squamous Epithelial: NONE SEEN

## 2013-09-30 ENCOUNTER — Emergency Department: Payer: Self-pay | Admitting: Emergency Medicine

## 2013-09-30 LAB — URINALYSIS, COMPLETE
Bilirubin,UR: NEGATIVE
Blood: NEGATIVE
Leukocyte Esterase: NEGATIVE
Nitrite: NEGATIVE
Ph: 7 (ref 4.5–8.0)
WBC UR: NONE SEEN /HPF (ref 0–5)

## 2013-09-30 LAB — CBC WITH DIFFERENTIAL/PLATELET
Basophil #: 0.1 10*3/uL (ref 0.0–0.1)
Eosinophil #: 0.1 10*3/uL (ref 0.0–0.7)
Eosinophil %: 1.5 %
HCT: 35.9 % — ABNORMAL LOW (ref 40.0–52.0)
HGB: 12.5 g/dL — ABNORMAL LOW (ref 13.0–18.0)
MCV: 80 fL (ref 80–100)
Neutrophil %: 66.3 %
Platelet: 88 10*3/uL — ABNORMAL LOW (ref 150–440)
WBC: 8.6 10*3/uL (ref 3.8–10.6)

## 2013-09-30 LAB — COMPREHENSIVE METABOLIC PANEL
Anion Gap: 4 — ABNORMAL LOW (ref 7–16)
BUN: 10 mg/dL (ref 7–18)
Bilirubin,Total: 0.6 mg/dL (ref 0.2–1.0)
Calcium, Total: 8.9 mg/dL (ref 8.5–10.1)
Chloride: 100 mmol/L (ref 98–107)
Glucose: 405 mg/dL — ABNORMAL HIGH (ref 65–99)
Osmolality: 279 (ref 275–301)
Total Protein: 7.7 g/dL (ref 6.4–8.2)

## 2013-09-30 LAB — LIPASE, BLOOD: Lipase: 87 U/L (ref 73–393)

## 2013-10-05 ENCOUNTER — Emergency Department: Payer: Self-pay | Admitting: Emergency Medicine

## 2013-10-05 LAB — COMPREHENSIVE METABOLIC PANEL
Albumin: 3.6 g/dL (ref 3.4–5.0)
Alkaline Phosphatase: 102 U/L
Anion Gap: 7 (ref 7–16)
BUN: 9 mg/dL (ref 7–18)
Bilirubin,Total: 0.5 mg/dL (ref 0.2–1.0)
Calcium, Total: 9.3 mg/dL (ref 8.5–10.1)
Co2: 30 mmol/L (ref 21–32)
Creatinine: 0.8 mg/dL (ref 0.60–1.30)
Glucose: 428 mg/dL — ABNORMAL HIGH (ref 65–99)
Potassium: 3.8 mmol/L (ref 3.5–5.1)
SGOT(AST): 8 U/L — ABNORMAL LOW (ref 15–37)
Total Protein: 7.8 g/dL (ref 6.4–8.2)

## 2013-10-05 LAB — CBC
HCT: 38.2 % — ABNORMAL LOW (ref 40.0–52.0)
HGB: 13.1 g/dL (ref 13.0–18.0)
MCH: 27.3 pg (ref 26.0–34.0)
Platelet: 159 10*3/uL (ref 150–440)
RBC: 4.81 10*6/uL (ref 4.40–5.90)

## 2013-10-05 LAB — LIPASE, BLOOD: Lipase: 86 U/L (ref 73–393)

## 2013-10-05 LAB — RAPID INFLUENZA A&B ANTIGENS

## 2013-10-18 ENCOUNTER — Emergency Department: Payer: Self-pay | Admitting: Emergency Medicine

## 2013-10-19 LAB — COMPREHENSIVE METABOLIC PANEL
ALK PHOS: 86 U/L
Albumin: 3.1 g/dL — ABNORMAL LOW (ref 3.4–5.0)
Anion Gap: 4 — ABNORMAL LOW (ref 7–16)
BUN: 12 mg/dL (ref 7–18)
Bilirubin,Total: 0.3 mg/dL (ref 0.2–1.0)
Calcium, Total: 8.5 mg/dL (ref 8.5–10.1)
Chloride: 103 mmol/L (ref 98–107)
Co2: 30 mmol/L (ref 21–32)
Creatinine: 0.88 mg/dL (ref 0.60–1.30)
EGFR (African American): >60
EGFR (Non-African Amer.): >60
GLUCOSE: 347 mg/dL — AB (ref 65–99)
OSMOLALITY: 287 (ref 275–301)
Potassium: 3.7 mmol/L (ref 3.5–5.1)
SGOT(AST): 9 U/L — ABNORMAL LOW (ref 15–37)
SGPT (ALT): 21 U/L (ref 12–78)
Sodium: 137 mmol/L (ref 136–145)
Total Protein: 6.8 g/dL (ref 6.4–8.2)

## 2013-10-19 LAB — URINALYSIS, COMPLETE
Bacteria: NONE SEEN
Bilirubin,UR: NEGATIVE
Blood: NEGATIVE
Glucose,UR: >=500 mg/dL (ref 0–75)
KETONE: NEGATIVE
Leukocyte Esterase: NEGATIVE
Nitrite: NEGATIVE
PROTEIN: NEGATIVE
Ph: 6 (ref 4.5–8.0)
Specific Gravity: 1.018 (ref 1.003–1.030)
WBC UR: NONE SEEN /HPF (ref 0–5)

## 2013-10-19 LAB — CBC
HCT: 35.6 % — ABNORMAL LOW (ref 40.0–52.0)
HGB: 12.4 g/dL — AB (ref 13.0–18.0)
MCH: 27.6 pg (ref 26.0–34.0)
MCHC: 34.8 g/dL (ref 32.0–36.0)
MCV: 79 fL — AB (ref 80–100)
PLATELETS: 163 10*3/uL (ref 150–440)
RBC: 4.48 10*6/uL (ref 4.40–5.90)
RDW: 13.7 % (ref 11.5–14.5)
WBC: 9.6 10*3/uL (ref 3.8–10.6)

## 2013-10-19 LAB — LIPASE, BLOOD: Lipase: 137 U/L (ref 73–393)

## 2013-10-27 ENCOUNTER — Inpatient Hospital Stay: Payer: Self-pay | Admitting: Internal Medicine

## 2013-10-27 LAB — URINALYSIS, COMPLETE
BLOOD: NEGATIVE
Bacteria: NONE SEEN
Bilirubin,UR: NEGATIVE
Glucose,UR: >=500 mg/dL (ref 0–75)
KETONE: NEGATIVE
LEUKOCYTE ESTERASE: NEGATIVE
Nitrite: NEGATIVE
PROTEIN: NEGATIVE
Ph: 5 (ref 4.5–8.0)
SPECIFIC GRAVITY: 1.01 (ref 1.003–1.030)
WBC UR: <1 /HPF (ref 0–5)

## 2013-10-27 LAB — CBC WITH DIFFERENTIAL/PLATELET
BASOS ABS: 0.1 10*3/uL (ref 0.0–0.1)
BASOS PCT: 1 %
EOS ABS: 0.1 10*3/uL (ref 0.0–0.7)
Eosinophil %: 1 %
HCT: 40.1 % (ref 40.0–52.0)
HGB: 13.4 g/dL (ref 13.0–18.0)
Lymphocyte #: 1.7 10*3/uL (ref 1.0–3.6)
Lymphocyte %: 20.2 %
MCH: 26.9 pg (ref 26.0–34.0)
MCHC: 33.4 g/dL (ref 32.0–36.0)
MCV: 81 fL (ref 80–100)
MONO ABS: 0.6 x10 3/mm (ref 0.2–1.0)
Monocyte %: 7.4 %
Neutrophil #: 5.9 10*3/uL (ref 1.4–6.5)
Neutrophil %: 70.4 %
PLATELETS: 149 10*3/uL — AB (ref 150–440)
RBC: 4.98 10*6/uL (ref 4.40–5.90)
RDW: 13.3 % (ref 11.5–14.5)
WBC: 8.3 10*3/uL (ref 3.8–10.6)

## 2013-10-27 LAB — COMPREHENSIVE METABOLIC PANEL
ALK PHOS: 81 U/L
AST: 20 U/L (ref 15–37)
Albumin: 3.5 g/dL (ref 3.4–5.0)
Anion Gap: 8 (ref 7–16)
BILIRUBIN TOTAL: 0.6 mg/dL (ref 0.2–1.0)
BUN: 7 mg/dL (ref 7–18)
Calcium, Total: 9.3 mg/dL (ref 8.5–10.1)
Chloride: 101 mmol/L (ref 98–107)
Co2: 26 mmol/L (ref 21–32)
Creatinine: 0.56 mg/dL — ABNORMAL LOW (ref 0.60–1.30)
EGFR (African American): >60
Glucose: 315 mg/dL — ABNORMAL HIGH (ref 65–99)
Osmolality: 280 (ref 275–301)
Potassium: 3.6 mmol/L (ref 3.5–5.1)
SGPT (ALT): 29 U/L (ref 12–78)
Sodium: 135 mmol/L — ABNORMAL LOW (ref 136–145)
TOTAL PROTEIN: 7 g/dL (ref 6.4–8.2)

## 2013-10-27 LAB — LIPASE, BLOOD: Lipase: 82 U/L (ref 73–393)

## 2013-10-27 LAB — BETA-HYDROXYBUTYRIC ACID: Beta-Hydroxybutyrate: 0.8 mg/dL (ref 0.2–2.8)

## 2013-10-27 LAB — MAGNESIUM: MAGNESIUM: 1.6 mg/dL — AB

## 2013-10-27 LAB — CLOSTRIDIUM DIFFICILE(ARMC)

## 2013-10-27 LAB — TROPONIN I: Troponin-I: <0.02 ng/mL

## 2013-10-28 LAB — BASIC METABOLIC PANEL
ANION GAP: 5 — AB (ref 7–16)
BUN: 6 mg/dL — ABNORMAL LOW (ref 7–18)
CHLORIDE: 102 mmol/L (ref 98–107)
Calcium, Total: 8.9 mg/dL (ref 8.5–10.1)
Co2: 27 mmol/L (ref 21–32)
Creatinine: 0.63 mg/dL (ref 0.60–1.30)
EGFR (African American): >60
EGFR (Non-African Amer.): >60
Glucose: 258 mg/dL — ABNORMAL HIGH (ref 65–99)
OSMOLALITY: 275 (ref 275–301)
Potassium: 3.7 mmol/L (ref 3.5–5.1)
SODIUM: 134 mmol/L — AB (ref 136–145)

## 2013-10-28 LAB — CBC WITH DIFFERENTIAL/PLATELET
Basophil #: 0 10*3/uL (ref 0.0–0.1)
Basophil %: 0.4 %
Eosinophil #: 0 10*3/uL (ref 0.0–0.7)
Eosinophil %: 0.1 %
HCT: 36.6 % — AB (ref 40.0–52.0)
HGB: 12.6 g/dL — ABNORMAL LOW (ref 13.0–18.0)
LYMPHS PCT: 9.6 %
Lymphocyte #: 0.6 10*3/uL — ABNORMAL LOW (ref 1.0–3.6)
MCH: 27.4 pg (ref 26.0–34.0)
MCHC: 34.4 g/dL (ref 32.0–36.0)
MCV: 80 fL (ref 80–100)
Monocyte #: 0.1 x10 3/mm — ABNORMAL LOW (ref 0.2–1.0)
Monocyte %: 1.7 %
Neutrophil #: 5.1 10*3/uL (ref 1.4–6.5)
Neutrophil %: 88.2 %
Platelet: 134 10*3/uL — ABNORMAL LOW (ref 150–440)
RBC: 4.59 10*6/uL (ref 4.40–5.90)
RDW: 13 % (ref 11.5–14.5)
WBC: 5.8 10*3/uL (ref 3.8–10.6)

## 2013-10-30 LAB — STOOL CULTURE

## 2013-10-31 LAB — VANCOMYCIN, TROUGH: VANCOMYCIN, TROUGH: 7 ug/mL — AB (ref 10–20)

## 2013-10-31 LAB — PATHOLOGY REPORT

## 2013-11-01 LAB — CULTURE, BLOOD (SINGLE)

## 2013-11-02 LAB — CULTURE, BLOOD (SINGLE)

## 2013-11-14 ENCOUNTER — Inpatient Hospital Stay: Payer: Self-pay | Admitting: Internal Medicine

## 2013-11-14 LAB — CBC
HCT: 36.9 % — ABNORMAL LOW (ref 40.0–52.0)
HGB: 12.8 g/dL — ABNORMAL LOW (ref 13.0–18.0)
MCH: 27.6 pg (ref 26.0–34.0)
MCHC: 34.6 g/dL (ref 32.0–36.0)
MCV: 80 fL (ref 80–100)
Platelet: 134 10*3/uL — ABNORMAL LOW (ref 150–440)
RBC: 4.63 10*6/uL (ref 4.40–5.90)
RDW: 13.1 % (ref 11.5–14.5)
WBC: 9.6 10*3/uL (ref 3.8–10.6)

## 2013-11-14 LAB — URINALYSIS, COMPLETE
BILIRUBIN, UR: NEGATIVE
Bacteria: NEGATIVE
Blood: NEGATIVE
GLUCOSE, UR: NEGATIVE mg/dL (ref 0–75)
Ketone: NEGATIVE
Leukocyte Esterase: NEGATIVE
NITRITE: NEGATIVE
PH: 5 (ref 4.5–8.0)
Protein: NEGATIVE
RBC, UR: NONE SEEN /HPF (ref 0–5)
Specific Gravity: 1.015 (ref 1.003–1.030)

## 2013-11-14 LAB — COMPREHENSIVE METABOLIC PANEL
ALT: 22 U/L (ref 12–78)
Albumin: 3.1 g/dL — ABNORMAL LOW (ref 3.4–5.0)
Alkaline Phosphatase: 76 U/L
Anion Gap: 4 — ABNORMAL LOW (ref 7–16)
BUN: 5 mg/dL — AB (ref 7–18)
Bilirubin,Total: 0.6 mg/dL (ref 0.2–1.0)
CALCIUM: 9 mg/dL (ref 8.5–10.1)
Chloride: 103 mmol/L (ref 98–107)
Co2: 31 mmol/L (ref 21–32)
Creatinine: 0.64 mg/dL (ref 0.60–1.30)
EGFR (African American): >60
EGFR (Non-African Amer.): >60
GLUCOSE: 219 mg/dL — AB (ref 65–99)
Osmolality: 280 (ref 275–301)
Potassium: 3.1 mmol/L — ABNORMAL LOW (ref 3.5–5.1)
SGOT(AST): 7 U/L — ABNORMAL LOW (ref 15–37)
SODIUM: 138 mmol/L (ref 136–145)
Total Protein: 6.8 g/dL (ref 6.4–8.2)

## 2013-11-14 LAB — LIPASE, BLOOD: Lipase: 84 U/L (ref 73–393)

## 2013-11-15 LAB — CBC WITH DIFFERENTIAL/PLATELET
Basophil #: 0.1 10*3/uL (ref 0.0–0.1)
Basophil %: 1.1 %
EOS ABS: 0.2 10*3/uL (ref 0.0–0.7)
Eosinophil %: 2.3 %
HCT: 34.8 % — ABNORMAL LOW (ref 40.0–52.0)
HGB: 12.1 g/dL — ABNORMAL LOW (ref 13.0–18.0)
LYMPHS ABS: 1.2 10*3/uL (ref 1.0–3.6)
LYMPHS PCT: 16.1 %
MCH: 27.4 pg (ref 26.0–34.0)
MCHC: 34.7 g/dL (ref 32.0–36.0)
MCV: 79 fL — AB (ref 80–100)
MONO ABS: 0.7 x10 3/mm (ref 0.2–1.0)
MONOS PCT: 8.9 %
NEUTROS PCT: 71.6 %
Neutrophil #: 5.3 10*3/uL (ref 1.4–6.5)
PLATELETS: 122 10*3/uL — AB (ref 150–440)
RBC: 4.41 10*6/uL (ref 4.40–5.90)
RDW: 13 % (ref 11.5–14.5)
WBC: 7.4 10*3/uL (ref 3.8–10.6)

## 2013-11-15 LAB — BASIC METABOLIC PANEL
Anion Gap: 6 — ABNORMAL LOW (ref 7–16)
BUN: 4 mg/dL — ABNORMAL LOW (ref 7–18)
CHLORIDE: 104 mmol/L (ref 98–107)
CO2: 28 mmol/L (ref 21–32)
Calcium, Total: 8.9 mg/dL (ref 8.5–10.1)
Creatinine: 0.55 mg/dL — ABNORMAL LOW (ref 0.60–1.30)
EGFR (Non-African Amer.): >60
GLUCOSE: 164 mg/dL — AB (ref 65–99)
OSMOLALITY: 276 (ref 275–301)
Potassium: 3.4 mmol/L — ABNORMAL LOW (ref 3.5–5.1)
Sodium: 138 mmol/L (ref 136–145)

## 2013-11-15 LAB — CLOSTRIDIUM DIFFICILE(ARMC)

## 2013-11-24 ENCOUNTER — Emergency Department: Payer: Self-pay | Admitting: Emergency Medicine

## 2013-11-24 LAB — URINALYSIS, COMPLETE
BILIRUBIN, UR: NEGATIVE
Bacteria: NONE SEEN
Blood: NEGATIVE
Hyaline Cast: 3
KETONE: NEGATIVE
LEUKOCYTE ESTERASE: NEGATIVE
NITRITE: NEGATIVE
PROTEIN: NEGATIVE
Ph: 5 (ref 4.5–8.0)
SPECIFIC GRAVITY: 1.024 (ref 1.003–1.030)
Squamous Epithelial: 3
WBC UR: <1 /HPF (ref 0–5)

## 2013-11-24 LAB — CBC WITH DIFFERENTIAL/PLATELET
BASOS ABS: 0.1 10*3/uL (ref 0.0–0.1)
BASOS PCT: 1 %
EOS ABS: 0.1 10*3/uL (ref 0.0–0.7)
Eosinophil %: 0.8 %
HCT: 39.6 % — AB (ref 40.0–52.0)
HGB: 13.6 g/dL (ref 13.0–18.0)
Lymphocyte #: 1.7 10*3/uL (ref 1.0–3.6)
Lymphocyte %: 17.1 %
MCH: 27.6 pg (ref 26.0–34.0)
MCHC: 34.3 g/dL (ref 32.0–36.0)
MCV: 80 fL (ref 80–100)
MONOS PCT: 7.3 %
Monocyte #: 0.7 x10 3/mm (ref 0.2–1.0)
Neutrophil #: 7.2 10*3/uL — ABNORMAL HIGH (ref 1.4–6.5)
Neutrophil %: 73.8 %
PLATELETS: 189 10*3/uL (ref 150–440)
RBC: 4.93 10*6/uL (ref 4.40–5.90)
RDW: 13 % (ref 11.5–14.5)
WBC: 9.7 10*3/uL (ref 3.8–10.6)

## 2013-11-24 LAB — COMPREHENSIVE METABOLIC PANEL
ALT: 33 U/L (ref 12–78)
Albumin: 3.3 g/dL — ABNORMAL LOW (ref 3.4–5.0)
Alkaline Phosphatase: 109 U/L
Anion Gap: 7 (ref 7–16)
BUN: 9 mg/dL (ref 7–18)
Bilirubin,Total: 0.6 mg/dL (ref 0.2–1.0)
CALCIUM: 9 mg/dL (ref 8.5–10.1)
CHLORIDE: 99 mmol/L (ref 98–107)
CO2: 27 mmol/L (ref 21–32)
Creatinine: 0.9 mg/dL (ref 0.60–1.30)
EGFR (African American): >60
Glucose: 447 mg/dL — ABNORMAL HIGH (ref 65–99)
Osmolality: 284 (ref 275–301)
Potassium: 4 mmol/L (ref 3.5–5.1)
SGOT(AST): 16 U/L (ref 15–37)
Sodium: 133 mmol/L — ABNORMAL LOW (ref 136–145)
Total Protein: 7.2 g/dL (ref 6.4–8.2)

## 2013-12-01 ENCOUNTER — Emergency Department: Payer: Self-pay | Admitting: Emergency Medicine

## 2013-12-01 LAB — COMPREHENSIVE METABOLIC PANEL
ALBUMIN: 3.7 g/dL (ref 3.4–5.0)
ALT: 25 U/L (ref 12–78)
ANION GAP: 3 — AB (ref 7–16)
AST: 18 U/L (ref 15–37)
Alkaline Phosphatase: 98 U/L
BUN: 8 mg/dL (ref 7–18)
Bilirubin,Total: 0.5 mg/dL (ref 0.2–1.0)
CALCIUM: 9.5 mg/dL (ref 8.5–10.1)
CHLORIDE: 102 mmol/L (ref 98–107)
Co2: 31 mmol/L (ref 21–32)
Creatinine: 0.72 mg/dL (ref 0.60–1.30)
EGFR (African American): >60
Glucose: 254 mg/dL — ABNORMAL HIGH (ref 65–99)
OSMOLALITY: 279 (ref 275–301)
Potassium: 3.6 mmol/L (ref 3.5–5.1)
Sodium: 136 mmol/L (ref 136–145)
TOTAL PROTEIN: 7.8 g/dL (ref 6.4–8.2)

## 2013-12-01 LAB — CBC WITH DIFFERENTIAL/PLATELET
Basophil #: 0.1 10*3/uL (ref 0.0–0.1)
Basophil %: 0.9 %
Eosinophil #: 0.1 10*3/uL (ref 0.0–0.7)
Eosinophil %: 1 %
HCT: 40.6 % (ref 40.0–52.0)
HGB: 13.9 g/dL (ref 13.0–18.0)
Lymphocyte #: 2 10*3/uL (ref 1.0–3.6)
Lymphocyte %: 25.9 %
MCH: 27.3 pg (ref 26.0–34.0)
MCHC: 34.3 g/dL (ref 32.0–36.0)
MCV: 80 fL (ref 80–100)
MONO ABS: 0.6 x10 3/mm (ref 0.2–1.0)
MONOS PCT: 7.7 %
Neutrophil #: 4.9 10*3/uL (ref 1.4–6.5)
Neutrophil %: 64.5 %
PLATELETS: 170 10*3/uL (ref 150–440)
RBC: 5.1 10*6/uL (ref 4.40–5.90)
RDW: 13.1 % (ref 11.5–14.5)
WBC: 7.6 10*3/uL (ref 3.8–10.6)

## 2013-12-01 LAB — URINALYSIS, COMPLETE
Bilirubin,UR: NEGATIVE
Blood: NEGATIVE
Glucose,UR: >=500 mg/dL (ref 0–75)
KETONE: NEGATIVE
LEUKOCYTE ESTERASE: NEGATIVE
Nitrite: NEGATIVE
Ph: 5 (ref 4.5–8.0)
Protein: 30
RBC,UR: NONE SEEN /HPF (ref 0–5)
Specific Gravity: 1.018 (ref 1.003–1.030)
WBC UR: 1 /HPF (ref 0–5)

## 2013-12-01 LAB — LIPASE, BLOOD: Lipase: 103 U/L (ref 73–393)

## 2015-01-26 NOTE — Discharge Summary (Signed)
PATIENT NAME:  Roberto [name], Roberto [name] MR#:  [id number] DATE OF BIRTH:  [birth year]  DATE OF ADMISSION:  08/13/2012 DATE OF DISCHARGE:  08/14/2012  DISCHARGE DIAGNOSES:  1. HONK, hyperosmolar nonketotic state, resolved. 2. Hyperglycemia. 3. Medication and diet noncompliance. 4. Crohn's disease.  5. Chronic abdominal pack. 6. Tobacco abuse.    HOSPITAL CONSULTATIONS: None.   HOSPITAL COURSE: The patient is a 39 year old male with history of diabetes mellitus type II admitted for generalized weakness, dizziness, uncontrolled hyperglycemia. His blood sugar was 700 when he came in. Look at the history and physical for full details. The patient's blood sugar was 700 and anion gap was 11 on admission. He was admitted to ICU, started on insulin drip along with fluids. The patient was kept n.p.o. because of pain and nausea. The patient's pain and nausea resolved and  insulin changed to Levemir and sliding scale coverage. The patient's blood sugars improved. Acetone was negative. Potassium and sodium were within normal limits on November 6th. The patient started back on his regimen and he felt fine the following day and tolerating the diet. We will discharge him home.   DISCHARGE MEDICATIONS:  1. NovoLog sub-Q with sliding scale. The patient is aware of the sliding scale and has been doing it for years now.  2. Levemir 35 units at bedtime.  3. Asacol 800 mg 2 tablets p.o. t.i.d.  4. Tramadol 50 mg every four hours p.r.n.  5. Oxycodone/acetaminophen 5/325 two times a day as needed.   DIET: Low sodium, ADA diet.  FOLLOW-UP: The patient has an appointment with Dr. Wendall Mola on November 19th.   CONDITION ON DISCHARGE: Stable.   TIME SPENT ON DISCHARGE PREPARATION: More than 30 minutes.   ____________________________ Katha Hamming, MD sk:drc D: 08/16/2012 22:44:39 ET T: 08/18/2012 14:58:30 ET JOB#: [id number]  cc: Katha Hamming, MD, <Dictator> A. Wendall Mola, MD Katha Hamming  MD ELECTRONICALLY SIGNED 08/31/2012 14:55

## 2015-01-26 NOTE — H&P (Signed)
PATIENT NAME:  Roberto [name], Roberto [name] MR#:  [id number] DATE OF BIRTH:  12/27/1975  DATE OF ADMISSION:  08/13/2012  PRIMARY CARE PHYSICIAN: He has no primary care physician.  REFERRING PHYSICIAN: Dr. Brandt Loosen  CHIEF COMPLAINT: Dizziness, generalized weakness, uncontrolled hyperglycemia and abdominal discomfort.   HISTORY OF PRESENT ILLNESS: Roberto [name] is a 39 year old African American male known to have diabetes mellitus type 1 with multiple admissions to the hospital with uncontrolled diabetes. He had been seen by endocrinology including Dr. Tedd Sias. He also carries a diagnosis of Crohn's disease. The patient states that since he left the hospital last admission which was in September of this year his blood sugar is uncontrolled, it is elevated most all the time reaching 300 to 400 despite his claim that he is taking his medication and he self medicated by increasing the dose of Levemir and also the sliding scale. The last few days he is getting more weak, dizzy and got dehydrated, along with that he feels blurring of vision and developed abdominal discomfort but no vomiting, no diarrhea. Patient came to the hospital with these symptoms and he was found to have severe hyperglycemia with a blood sugar reaching 700. Patient was admitted for further evaluation and treatment.    REVIEW OF SYSTEMS: CONSTITUTIONAL: Denies having any fever. No chills but he has fatigue. EYES: He reports some blurring of vision. No double vision. ENT: No hearing impairment. No sore throat. No dysphagia. CARDIOVASCULAR: No chest pain. No shortness of breath. No palpitations. No syncope. RESPIRATORY: No cough. No sputum production. No hemoptysis. No shortness of breath. GASTROINTESTINAL: Reports nonlocalized abdominal discomfort, not very well descriptive. No vomiting, no diarrhea, no melena or hematochezia. GENITOURINARY: No dysuria. No frequency of urination. MUSCULOSKELETAL: No joint pain or swelling. No muscular pain or swelling.  INTEGUMENTARY: No skin rash. No ulcers. NEUROLOGY: No focal weakness. No seizure activity. No headache. PSYCHIATRY: He looks slightly depressed. No anxiety. ENDOCRINE: Reports some polyuria and mild polydipsia. No heat or cold intolerance.   PAST MEDICAL HISTORY:  1. Type 1 diabetes mellitus on insulin using Levemir and also he is on sliding scale.  2. Crohn's disease.  3. Sickle cell trait.  4. Chronic back pain.  5. History of thrombocytopenia, followed up by Dr. Sherrlyn Hock. He had previous bone biopsy and aspirate.  6. Gastroesophageal reflux disease.  7. Peripheral neuropathy.  8. History of methicillin-resistant staphylococcus aureus sinus infection in 2008.  9. History of posttraumatic stress disorder.   PAST SURGICAL HISTORY:  1. Right foot surgery.  2. Bilateral orchiectomy status post blunt trauma.   SOCIAL HABITS: Chronic smoker, about seven cigarettes a day. He started smoking at the age of 87. No history of alcohol or other drug abuse but occasionally he may smoke marijuana.   FAMILY HISTORY: Positive for diabetes including his parents and grandmother and his aunt. His aunt suffers from end-stage renal disease. His mother has Crohn's disease.   SOCIAL HISTORY: He is single. He had divorced his wife. The patient tells me that right now he is walking that [employer].   ADMISSION MEDICATIONS:  1. Levemir 35 units at night. 2. NovoLog sliding scale.  3. Tramadol 50 mg every four hours p.r.n.  4. Asacol 800 mg 3 times a day.   ALLERGIES: No known drug allergies.   PHYSICAL EXAMINATION:  VITAL SIGNS: Blood pressure 136/78, respiratory rate 18, pulse 82, oxygen saturation 98%.   GENERAL APPEARANCE: Young male laying in bed. He looks lethargic.   HEAD AND NECK: No  pallor. No icterus. No cyanosis.   ENT: Hearing was normal. Nasal mucosa, lips, tongue were normal apart from dry lips and mucous membranes.   EYES: Normal eyelids and conjunctiva. Pupils about 4 to 5 mm, equal and  reactive to light.   NECK: Supple. Trachea at midline. No thyromegaly. No cervical lymphadenopathy. No masses.   HEART: Normal S1, S2. No S3, S4. No murmur. No gallop. No carotid bruits.   RESPIRATORY: Normal breathing pattern without use of accessory muscles. No rales. No wheezing.   ABDOMEN: Soft, a little tenderness upon deep palpation, nonlocalized. No guarding. No masses. No hepatosplenomegaly. No hernias.   SKIN: No ulcers. No subcutaneous nodules.   MUSCULOSKELETAL: No joint swelling. No clubbing.   NEUROLOGIC: Cranial nerves II through XII are intact. No focal motor deficit.   PSYCHIATRIC: Patient is alert, oriented x3. Mood and affect are flat.   LABORATORY, DIAGNOSTIC, AND RADIOLOGICAL DATA: Blood sugar 711, BUN 10, creatinine 0.9, sodium 133, potassium 3.7, lipase 131, bicarbonate 27, anion gap 11, calcium 8.6. Normal liver function tests including liver transaminases. CBC showed white count 7000, hemoglobin 14, hematocrit 42, platelet count 116. Urinalysis showed glucose more than 500, otherwise unremarkable.   ASSESSMENT:  1. Hyperosmolar nonketotic hyperglycemia. 2. Severe dehydration  3. Crohn's disease.  4. History of noncompliance with medications and his diet but this time he claimed that he was compliant with his medications.   OTHER MEDICAL PROBLEMS: 1. Sickle cell trait.  2. Thrombocytopenia.  3. Gastroesophageal reflux disease. 4. Peripheral neuropathy.  5. Posttraumatic stress disorder. 6. History of orchiectomy secondary to trauma.  7. Patient is also a chronic smoker.   PLAN: Will admit the patient to the Intensive Care Unit. Aggressive IV hydration with normal saline. IV insulin drip and close monitoring of his blood sugar. Continue Asacol 800 mg 3 times a day. Patient needs to quit tobacco abuse and he also needs to follow up regularly with a physician. He tells me that he tried to go to the [facility] they declined his request. I will keep him  n.p.o. for the time being except for the oral medications until his blood sugar is reasonably controlled.   TIME SPENT: Time Spent evaluating this patient and reviewing medical records took more than 55 minutes.   ____________________________ Carney Corners. Rudene Re, MD amd:cms D: 08/13/2012 01:35:11 ET T: 08/13/2012 07:28:37 ET JOB#: [id number]  cc: Carney Corners. Rudene Re, MD, <Dictator>  Zollie Scale MD ELECTRONICALLY SIGNED 08/14/2012 22:43

## 2015-01-26 NOTE — H&P (Signed)
PATIENT NAME:  Roberto [name], Roberto [name] MR#:  [id number] DATE OF BIRTH:  1975/11/20  DATE OF ADMISSION:  05/18/2012  PRIMARY CARE PHYSICIAN: None.   CHIEF COMPLAINT: Hyperglycemia.   HISTORY OF PRESENT ILLNESS: This is a 39 year old male with a history of Crohn's disease and type 2 diabetes who presents via EMS with elevated blood sugars. The patient says that his blood sugars normally run about 400s. This morning they were over 600. He took his dose of Levemir 35 units, however, they were still elevated so he called EMS because he felt groggy. He also had some nausea as well as polyuria, polydipsia, and blurred vision. He also says that he feels achy all over, which usually happens when his blood sugars are very high. The patient was hospitalized 07/11 through 04/20/2012 with severe hyperglycemia secondary to hyperosmolar nonketotic hyperglycemia. At that time, he was seen by Dr. Tedd Sias and discharged on 70/30.  The patient says that he has been on numerous medications. He was recently at [facility] where they changed him from the 70/30 to Levemir. As far as the patient's Crohn's disease, at discharge he was discharged with Asacol, but the patient says that he is not taking this medication. He has had three days of diarrhea, which is now resolved.   REVIEW OF SYSTEMS: CONSTITUTIONAL: No fevers. Positive chills. No fatigue. Positive fatigue and positive weakness. No weight loss or gain. EYES: No double vision. Positive blurred vision. No glaucoma or cataracts. ENT: No ear pain, hearing loss or seasonal allergies. Positive snoring. RESPIRATORY: No cough, wheezing, hemoptysis, or chronic obstructive pulmonary disease. CARDIOVASCULAR: No chest pain, orthopnea, palpitations, syncope, edema, arrhythmia, or dyspnea on exertion. GI: Positive nausea. No vomiting. Positive diarrhea which has resolved. No melena or ulcers. GENITOURINARY: No dysuria or hematuria. ENDOCRINE: Positive polyuria, polydipsia, and polyphagia.  HEME/LYMPH: No anemia or easy bruising. SKIN: No rash or lesions. MUSCULOSKELETAL: He has generalized pain which he says occurs when he has his uncontrolled diabetes. NEUROLOGIC: No history of cerebrovascular accident, transient ischemic attack, or seizures. PSYCHIATRIC: No history of anxiety or depression.   PAST MEDICAL HISTORY:  1. Diabetes type 2, poorly controlled.  2. Medical noncompliance.  3. Crohn's disease.  4. Chronic back pain.  5. Sickle cell trait.  6. PTSD.   MEDICATIONS:  1. The patient says he is taking Levemir 35 units at bedtime and sliding scale insulin.  2. As per the discharge summary from 04/20/2012, the patient was also discharged with metformin 1 gram twice a day. 3. Celexa 40 mg daily. 4. NovoLog 70/30, 75 units twice a day. 5. Asacol 800 mg 2 tablets three times daily. 6. AndroGel 5 gram packet one dose daily.  ALLERGIES: No known drug allergies.   SOCIAL HISTORY: The patient smokes 6 to 7 cigarettes a day. Occasional alcohol. No IV drug use.  FAMILY HISTORY: Positive for diabetes, stomach cancer, and Crohn's disease.   PAST SURGICAL HISTORY:  1. Bilateral orchiectomy.  2. Cholecystectomy.   PHYSICAL EXAMINATION:   VITAL SIGNS: Temperature 98.4, pulse 93, respirations 24, blood pressure 138/69, and saturation 97% on room air.   GENERAL: The patient is drowsy but able to answer questions appropriately.   HEENT: Head is atraumatic. Pupils are round and reactive. Sclerae anicteric. Mucous membranes are dry. Oropharynx is clear.   NECK: Supple. Very short neck, unable to appreciate any thyromegaly or carotid bruit.   CARDIOVASCULAR: Regular rate and rhythm. No murmurs, gallops, or rubs. PMI is not displaced.   LUNGS: Clear to  auscultation bilaterally without crackles, rales, rhonchi, or wheezing. Normal to percussion.   ABDOMEN: Obese. Bowel sounds are positive. Nontender. Hard to appreciate organomegaly due to body habitus.   EXTREMITIES: No  clubbing, cyanosis, or edema.   SKIN: No rash or lesions.   MUSCULOSKELETAL: 4/5 strength bilaterally and symmetrically. The patient seems to have some just overall generalized weakness.   NEURO: Cranial nerves II through XII grossly intact. There are no focal deficits.   LABS/STUDIES: Sodium 128, potassium 4.1, chloride 92, bicarbonate 21, BUN 11, creatinine 1.33, glucose 996, calcium 8.8, bilirubin 0.6, alkaline phosphatase 121, ALT 12, AST 14, total protein 7.5, and albumin 3.4. White blood cells 7.7, hemoglobin 13.3, hematocrit 41, and platelets 121.   Urinalysis shows no leukocyte esterase or nitrites.   ASSESSMENT AND PLAN: This is a 39 year old male who is noncompliant with type 2 diabetes who presents with nonketotic hyperglycemia.  1. Hyperosmolar nonketotic hyperglycemia. The patient will be admitted to the Critical Care Unit and will be placed on a non-DKA protocol with IV fluids and insulin drip. We will check a Hemoglobin A1c. Inpatient diabetes management. The patient will also benefit from seeing Dr. Tedd Sias, however, tomorrow is Sunday so I would recommend the patient see Dr. Tedd Sias prior to discharge.  2. Hyponatremia secondary to hyperglycemia. Suspect he sodium will improve once the blood sugars are improved.  3. Acute renal failure secondary to dehydration from problem #1. We will repeat a BMP in the a.m. after appropriate fluid resuscitation.  4. Smoking dependence. I encouraged the patient to stop smoking. The patient was counseled for three minutes.  5. Crohn's disease. Seems to be stable at this time. The patient is not taking Asacol. Medication at this time will be deferred until better assessment of the patient regarding this medication.   6. The patient will need a primary care physician prior to discharge.   CRITICAL CARE TIME SPENT: Approximately 55 minutes. ____________________________ Janyth Contes. Juliene Pina, MD spm:slb D: 05/18/2012 22:08:15 ET T: 05/19/2012 10:22:22  ET JOB#: [id number]  cc: Stephone Gum P. Juliene Pina, MD, <Dictator> Salsabeel Gorelick P Edric Fetterman MD ELECTRONICALLY SIGNED 05/19/2012 13:10

## 2015-01-26 NOTE — Discharge Summary (Signed)
PATIENT NAME:  Roberto [name], Roberto [name] MR#:  [id number] DATE OF BIRTH:  [birth year]  DATE OF ADMISSION:  07/05/2012 DATE OF DISCHARGE:  07/07/2012  For a detailed note, please take a look at the history and physical done on admission by Dr. Rudene Re.   DISCHARGE DIAGNOSES: 1. Acute diabetic ketoacidosis, likely secondary to medical noncompliance. 2. Uncontrolled diabetes.  3. Hypokalemia.  4. History of ulcerative colitis.  5. Chronic pain.   DISCHARGE DIET: The patient is being discharged on an American Diabetic Association diet.   ACTIVITY: As tolerated.   DISCHARGE FOLLOWUP: Followup with the [facility] in the next 1 to 2 weeks.   DISCHARGE MEDICATIONS: 1. Tylenol with oxycodone 5/325 mg one tab every 6 hours as needed.  2. NovoLog sliding scale.  3. Asacol 800 mg 2 tabs three times daily. 4. Levemir 35 units at bedtime.   PERTINENT STUDIES: Chest x-ray done on admission showed no acute cardiopulmonary disease.   HOSPITAL COURSE: This is a 39 year old male with medical problems as mentioned above who presented to the hospital secondary to nausea, vomiting, and noted to be in acute diabetic ketoacidosis.  1. Acute diabetic ketoacidosis. This was likely the result of medical noncompliance as the patient says that he ran out of his insulin about a week to 10 days ago. He has had previous hospitalizations for similar reasons. He was started on insulin drip and IV fluids. The patient's blood sugars and anion gap have improved. His nausea and vomiting has now resolved, he is tolerating p.o. well, and he has been transitioned off the insulin drip to his Levemir and sliding scale insulin coverage. Case management is assisting him with his medications and providing him a weeks worth of insulin. He already has a glucometer at home. He is going to continue to follow up with the [facility]. On his previous hospitalization, the patient was homeless but currently has a job and has a place to  stay.  2. Hypokalemia. This was likely secondary to him being on an insulin drip. This has since then improved and now resolved.  3. Ulcerative colitis. The patient had no evidence of any hematochezia or any acute blood loss. He is on Asacol, but has not been taking it. He is being resumed on that and told to follow-up with his primary care physician and gastroenterology as an outpatient.  4. Chronic pain. The patient is already on some oxycodone at home which he will resume upon discharge.   CODE STATUS: THE PATIENT IS A FULL CODE.   TIME SPENT: 40 minutes. ____________________________ Rolly Pancake. Cherlynn Kaiser, MD vjs:slb D: 07/07/2012 13:05:42 ET T: 07/08/2012 10:10:23 ET JOB#: [id number]  cc: Rolly Pancake. Cherlynn Kaiser, MD, <Dictator> [facility] [place] Siren MD ELECTRONICALLY SIGNED 07/09/2012 8:52

## 2015-01-26 NOTE — Discharge Summary (Signed)
PATIENT NAME:  Roberto [name], Roberto [name] MR#:  [id number] DATE OF BIRTH:  Mar 20, 1976  DATE OF ADMISSION:  05/18/2012 DATE OF DISCHARGE:  05/20/2012  PRESENTING COMPLAINT: Elevated sugars.  DISCHARGE DIAGNOSES:  1. Hyperosmolar nonketotic uncontrolled diabetes.  2. Chronic pain.  3. History of Crohn's disease.   CONDITION ON DISCHARGE: Fair.   MEDICATIONS:  1. NovoLog sliding scale.  2. AndroGel 5 grams/packet, 1 application to affected area transdermal once a day.  3. Asacol 800 mg p.o. t.i.d.  4. Levemir 40 units at bedtime.  5. Norco 5/325, 1 tablet p.o. t.i.d. p.r.n., #10, no refills.   FOLLOWUP: Follow up with [facility] in 1 to 2 weeks.   LABORATORY, DIAGNOSTIC AND RADIOLOGICAL DATA: Hemoglobin A1c is 14.4. The rest of the chemistries with electrolytes are negative. Serum glucose on admission was 996. Anion gap is normal. Urinalysis negative for ketones.   BRIEF SUMMARY OF HOSPITAL COURSE: Mr. Saah is a 39 year old African American gentleman who has had similar admissions in the past, well known to the [facility] for hyperosmolar hyperglycemic nonketotic state with history of diabetes, hypertension, chronic pain, history of Crohn's, thrombocytopenia, and noncompliance, comes to the Emergency Room with:   1. Hyperosmolar hyperglycemic nonketotic state with uncontrolled sugars in the 900s: He was started on insulin drip and converted quickly. Sugars were down in the 140s to 200s. He was resumed back on his Levemir, however, the dose was increased to 40 units. He lives at different shelters. He is not able to maintain an ADA diet at the shelter home; and, again, there is history of noncompliance although the patient states he does take his insulin regularly.  2. Hyponatremia: suspected from elevated sugars, dehydration improved after aggressive hydration.  3. Chronic thrombocytopenia.  4. Chronic back pain.  5. Chronic history of Crohn's disease, continued Asacol.   DISPOSITION:  The patient is discharged back to the shelter home. He will follow up with [facility]. He was given a supply of his insulin from the hospital.   TIME SPENT: 40 minutes.   ____________________________ Wylie Hail Allena Katz, MD sap:cbb D: 05/21/2012 07:05:23 ET T: 05/21/2012 12:05:48 ET JOB#: [id number]  cc: Kiano Terrien A. Allena Katz, MD, <Dictator> [facility] Willow Ora MD ELECTRONICALLY SIGNED 05/31/2012 12:40
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
## 2015-01-29 NOTE — Discharge Summary (Signed)
PATIENT NAME:  Roberto [name], Roberto [name] MR#:  [id number] DATE OF BIRTH:  [birth year]  DATE OF ADMISSION:  01/05/2013 DATE OF DISCHARGE:  01/07/2013  PRIMARY CARE PHYSICIAN: None.   DISCHARGE DIAGNOSES: 1. Nonketotic hyperglycemia.  2. Uncontrolled diabetes.  3. Hypokalemia.  4. Hypomagnesemia.  5. Crohn's disease.   CONDITION: Stable.   CODE STATUS:  FULL CODE.     HOME MEDICATIONS: Mesalamine 400 mg p.o. 2 caps t.i.d., trazodone 150 mg p.o. at bedtime, Celexa 40 mg p.o. daily, Levemir 40 units subcutaneous once a day at bedtime.   DIET: Low fat, low cholesterol, ADA diet.   ACTIVITY: As tolerated.   FOLLOW-UP CARE: Follow up at [facility] within 1 week.   REASON FOR ADMISSION: Abdominal pain, nausea, vomiting, high blood sugar.   HOSPITAL COURSE: The patient is a 39 year old African American male with a history insulin-dependent diabetes with multiple admissions for DKA, presented to the ED with abdominal pain, nausea, vomiting and elevated blood sugar. His blood sugar was 817; anion gap was normal. The patient was admitted to the CCU for hyperglycemia with a diagnosis of nonketotic hyperosmolar state. After admission, the patient was treated with insulin drip. Blood sugar decreased to 250, and then the patient was started with Levemir 35 units subcutaneous  daily and started a diet. However, the patient's blood sugar is still not controlled.  Blood sugar last night was about 300 to 400 and this morning decreased to 221. We will increase Levemir to 40 units subcutaneous daily. In addition, the patient has hypomagnesemia.  Magnesium level is 1.6 today, and he was treated with magnesium IV today.   The patient's symptoms have much improved.  He is clinically stable and will be discharged to home today with Levemir, NovoLog.  I discussed the patient's discharge plan with the patient, nurse and the case manager.   TIME SPENT: About 38 minutes.   ____________________________ Shaune Pollack,  MD qc:cb D: 01/07/2013 16:20:44 ET T: 01/07/2013 21:48:30 ET JOB#: [id number]  cc: Shaune Pollack, MD, <Dictator> Shaune Pollack MD ELECTRONICALLY SIGNED 01/08/2013 20:53

## 2015-01-29 NOTE — Consult Note (Signed)
Chief Complaint and History:  Referring Physician Dr. Fonnie Birkenhead   Chief Complaint DKA and uncontrolled diabetes   Allergies:  No Known Allergies:   Assessment/Plan:  Assessment/Plan 39 yo M with type 1 diabetes admitted with DKA. he has had multiple admissions for DKA. He has long been non-adherent to a prescribed diabetes regimen. He explains that he's been unable to keep out-pt appts at the [facility] and they won't schedule him any longer. He currently complains of abd pain and cough. No fever. Pt was examined and labs were reviewed. His sugars are normalized now on insulin gtt. Last rate of insulin was 6.9 units/hr.  A/ DKA - resolved Type 1 diabetes, uncontrolled  P/ DC IV insulin Start Lantus 20 units daily - first dose now As he's eating very little, will start with prandial Novolog 3 units tid AC Stop IV Dextrose and replace with IV 0.9% NS Continue NovoLog SSI.  I will follow with you and a full consult will be dictated.   Electronic Signatures: Raj Janus (MD)  (Signed 15-Jul-14 16:38)  Authored: Chief Complaint and History, ALLERGIES, Assessment/Plan   Last Updated: 15-Jul-14 16:38 by Raj Janus (MD)

## 2015-01-29 NOTE — Discharge Summary (Signed)
PATIENT NAME:  Roberto [name], FUTRELL MR#:  [id number] DATE OF BIRTH:  05-07-1976  DATE OF ADMISSION:  04/13/2013 DATE OF DISCHARGE:  04/15/2013  CHIEF COMPLAINT: Abdominal pain, nausea, vomiting high blood sugars.   DISCHARGE DIAGNOSES: 1.  Non-ketotic hyperglycemia, with hemoglobin A1c of 17.  2.  Insulin and diet noncompliance.  3.  Hyponatremia.  4.  Hyperkalemia.  5.  Dehydration.  6.  History of irritable bowel disease, on Asacol.    DISCHARGE MEDICATIONS:  1.  Novolin R sliding-scale, as previously used.  2.  Asacol 800 mg, 2 times a day, delayed release.  3.  Insulin 70/30, 26 units 2 times a day before meals.  4.  AndroGel 40.5 grams topical, apply once daily.   DIET: Low sodium, ADA consistent-carb diet.   ACTIVITY: As tolerated.   FOLLOWUP: Please follow with PCP within 1 to 2 weeks.   DISPOSITION: Home.   SIGNIFICANT LABS AND IMAGING: Initial glucose was 999, BUN of 20, creatinine of 1.12, sodium of 126. Blood osms was 320, potassium 4.5. Hemoglobin A1c 17.   LFTs on arrival showed AST of 11; otherwise LFTs were within normal limits. Initial WBC 8.6, hemoglobin 13.1, platelets 111.   HISTORY OF PRESENT ILLNESS AND HOSPITAL COURSE: For full details of H and P, please see the dictation on July 6th by Dr. Elpidio Anis, but briefly this is a 39 year old African American male with a history of insulin-dependent diabetes mellitus, multiple admissions for DKA and Crohn's disease, who comes in for the above chief complaints and was noted to have elevated blood sugars, with normal ketones and not being in DKA. He had been started on Lantus as an outpatient, but was not taking it as he stated he could not afford it. Furthermore, he discussed that he was drinking lots of juice and came in needing an insulin drip, IV fluids, and a CCU admission. He was monitored on the CCU with an insulin drip overnight and transferred to the floor the next day after improvement in blood sugars. His IV fluids  were slowly decreased, and he was started on insulin 70/30 for cost reasons.   He had extensive counseling about his noncompliance, including dietary indiscretions as well as not using insulin. Hemoglobin A1c was severely elevated at 17.   He stated that he could not afford the 70/30, nor could he afford the Lantus. He was given several refills. He did have mild hyponatremia, but there was pseudohypernatremia as his sugars were severely elevated on arrival. He did have bouts of hypokalemia, which resolved. 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   Total time spent was 35 minutes.   THE PATIENT IS FULL CODE     ____________________________ Krystal Eaton, MD sa:dm D: 04/16/2013 08:19:47 ET T: 04/16/2013 08:59:58 ET JOB#: [id number]  cc: Krystal Eaton, MD, <Dictator> Krystal Eaton MD ELECTRONICALLY SIGNED 05/05/2013 14:33
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
## 2015-01-29 NOTE — Consult Note (Signed)
PATIENT NAME:  ZEPH, SISTARE MR#:  [id number] DATE OF BIRTH:  1975-11-10  DATE OF CONSULTATION:  04/22/2013  REFERRING PHYSICIAN:  Alford Highland, M.D. CONSULTING PHYSICIAN:  A. Wendall Mola, MD  CHIEF COMPLAINT: DKA.   HISTORY OF PRESENT ILLNESS: This is a 39 year old male with type 1 diabetes and a history of recurrent DKA who was admitted yesterday with nausea, vomiting and abdominal pain and found to be in DKA. Initial blood work showed a blood glucose of 631, creatinine 1.25, bicarb 9, anion gap 24, with an elevated white blood cell count of 26.6 and 2+ urinary ketones consistent with diabetic ketoacidosis. He has a history of Crohn's disease and has been started on empiric antibiotics. He states he had been in his usual state of health until about a week ago. At that point, he noticed his blood sugars were running very high. He developed onset of nausea and vomiting several days ago. He reports compliance with insulin; however, this is doubtful. He has had nonadherence to his prescribed regimen in the past. He has had multiple hospitalizations for diabetes, most recently with a discharge on July 8th from this facility. Hemoglobin A1c at that time was 17%. He claims his prescribed regimen is 70/30 mix 26 units b.i.d. in addition to a regular insulin sliding scale. He has not established care locally in quite some time. He was previously seen at the [facility], but due to repeated no-shows for scheduled appointments, he believes he has not been able to go back there. He hopes to get established at [facility] in the future.   The patient was admitted and placed on IV fluids and IV insulin. His blood sugar greatly improved, and dextrose was added to his fluids until his anion gap closed. His anion gap did close overnight, and currently blood sugars are in the 96-130 range. On the dextrose, his IV insulin rate is 6.9 units per hour. He still feels nauseated and does not want to eat.    MEDICAL HISTORY:  1. Crohn's disease.  2. Diabetes mellitus.  3. Postsurgical hypogonadism.   PAST SURGICAL HISTORY: Bilateral orchiectomy after trauma.   ALLERGIES: No known drug allergies.   MEDICATIONS:  1. IV insulin regular.  2. Flagyl 500 mg q.6 hours.  3. Pantoprazole 40 mg q.12 hours.  4. Levaquin 500 mg daily  5. D5 with KCl 20 mEq at 100 mL/hour.   SOCIAL HISTORY: The patient claims he is living with a friend who has 5 children. The patient denies the use of tobacco or alcohol.   FAMILY HISTORY: Positive for diabetes.   REVIEW OF SYSTEMS:  GENERAL: He reports fever. He reports weight loss, amount not known.  HEENT: He has had some blurred vision. He denies sore throat.  NECK: He denies neck pain. He denies dysphagia.  CARDIOVASCULAR: He reports right-sided chest pain. He denies palpitations.  PULMONARY: He reports shortness of breath and cough.  ABDOMEN: He has had nausea and vomiting. He has had abdominal pain. He has had poor appetite.  GENITOURINARY: Denies dysuria or hematuria. He denies polyuria.  MUSCULOSKELETAL: He denies arthralgias, myalgias or joint swelling.  SKIN: He denies rash or pruritus.  ENDOCRINE: Denies heat or cold intolerance.  HEMATOLOGIC: Denies recent bleeding or easy bruisability.   PHYSICAL EXAMINATION:  VITAL SIGNS: Height 70.9 inches, weight 190 pounds, BMI 26. Temp 98.2, pulse 74, respirations 24, blood pressure is 119/66, pulse ox 96%.  GENERAL: Ill-appearing African American male in mild distress.  PSYCHIATRIC:  Calm, cooperative, alert and oriented.  HEENT: EOMI. Oropharynx is clear. Mucous membranes moist.  NECK: Supple. No thyromegaly.  CARDIAC: Regular rate and rhythm. No murmur.  PULMONARY: Clear bilaterally.  ABDOMEN: Soft. Diffusely tender, nonfocal.  EXTREMITIES: No edema is present.  SKIN: No rash or dermatopathy is present.   LABORATORY DATA: Glucose 107, sodium 149, potassium 3.3, chloride 120, bicarb 20, potassium  3.3. Troponin I less than 0.02.   RADIOLOGY: CT chest, abdomen and pelvis shows bibasilar airspace disease right greater than left, concerning for pneumonia. No acute abdominal or pelvic pathology.   ASSESSMENT: A 39 year old male with history of severely uncontrolled diabetes, admitted yesterday for diabetic ketoacidosis, also found to have bibasilar pneumonia.   PLAN:  1. DKA now resolved. Will transition to subcutaneous insulin. Will DC IV dextrose and replace with normal saline. Will give Lantus 20 units now and daily. Will give NovoLog initially at 3 units t.i.d. a.c., to be increased as his appetite increases in addition to correction NovoLog before meals and at bedtime.  2. Discussed the dire importance of outpatient followup. He needs a primary care physician who he can rely on. He would greatly benefit from keeping scheduled office visits. I am hopeful he can get into [facility].  3. Long-term, he may not be able to afford ongoing use of Lantus and NovoLog as an outpatient, so depending on if we can get him samples after this hospitalization or not, we may have to resume 70/30 mix.   Thank you for the kind request for consultation. I will follow along with you   ____________________________ A. Wendall Mola, MD ams:gb D: 04/22/2013 20:52:51 ET T: 04/22/2013 21:08:48 ET JOB#: [id number]  cc: A. Wendall Mola, MD, <Dictator> Macy Mis MD ELECTRONICALLY SIGNED 04/23/2013 22:01

## 2015-01-29 NOTE — Discharge Summary (Signed)
PATIENT NAME:  Roberto [name], Roberto [name] MR#:  [id number] DATE OF BIRTH:  1975/11/13  DATE OF ADMISSION:  04/21/2013 DATE OF DISCHARGE:  04/25/2013  DICTATING DOCTOR:  Dr. Luberta Mutter     DISCHARGE DIAGNOSES: 1.  Severe diabetic ketoacidosis, resolved.   2.  Type 2 diabetes mellitus with noncompliance.  3.  Pneumonia.   4.  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   DISCHARGE MEDICATIONS:   1.  Reglan 5 mg p.o. t.i.d. before meals.  2.  Lantus 30 units daily.  3.  Augmentin 875/125, one tablet p.o. b.i.d. for 5 days.  4.  Aspart insulin 8 units t.i.d.   CONSULTATIONS:  Dr. Tedd Sias from endocrinology.   HOSPITAL COURSE:  The patient is a 39 year old male patient with history of multiple admissions for DKA. The patient has history of diabetes, noncompliance. Came in because of nausea, vomiting, diarrhea and unable to keep anything down. The patient had a pH of 7.17 with a glucose of 600 and anion gap of 24. Started on IV fluids, IV insulin drip. Admitted to ICU for severe DKA. The patient had nausea, vomiting and he was started on Zofran and IV fluids. Also the insulin drip, sugars were followed closely. The patient's anion gap closed the following day and patient seen by Dr. Tedd Sias. Starting on Lantus and also mealtime NovoLog. The patient has noncompliance issues and not taking medications regularly. The patient is on Lantus 30 units continued along with mealtime insulin and patient got medication help through our pharmacy.   OTHER DIAGNOSES INCLUDE:  Pneumonia, complained of cough and also pleuritic chest pain. Chest x-ray consistent with pneumonia. He got Levaquin here, but discharged home with Augmentin.  The patient also has some vomiting sensation, which is controlled with Reglan, so we gave him a prescription for Reglan.  PERTINENT LABORATORY DATA: WBC 26.6, hemoglobin 14.4, hematocrit 40.2, platelets 199 on admission. Electrolytes:  Sodium 137, potassium of 4.4, chloride 104, bicarb  9, BUN  22, creatinine 1.2.on July 14th. Patient's chest x-ray showed some right lung density  he had  hyper natremia to 150. Dr. Tedd Sias said continue the normal saline, which we continued and his sodium improved nicely to 143 on July 16th. The patient's renal failure also improved and initially stool for C. difficile was ordered for diarrhea, but he never had diarrhea after admission and could not get a stool for C. difficile. The patient also has leukocytosis pneumonia. WBC 9.8 on July 17th.   TIME SPENT ON DISCHARGE PREPARATION:  More than 30 minutes.    ____________________________ Katha Hamming, MD sk:NTS D: 04/26/2013 20:13:39 ET T: 04/27/2013 07:07:51 ET JOB#: [id number]  cc: Katha Hamming, MD, <Dictator> Katha Hamming MD ELECTRONICALLY SIGNED 05/05/2013 18:59

## 2015-01-29 NOTE — H&P (Signed)
PATIENT NAME:  Roberto [name], Roberto [name] MR#:  [id number] DATE OF BIRTH:  1976/02/07  DATE OF ADMISSION:  04/21/2013  PRIMARY CARE PHYSICIAN: At the [facility]   CHIEF COMPLAINT: Sick for 2 days.   HISTORY OF PRESENT ILLNESS: This is a 39 year old man with diabetes, uncontrolled, with numerous admissions for DKA in the past. He is coming in with feeling sick for 2 days, stomachache, vomiting, dizziness. His sugars have been up. He states that he has been taking his 26 units b.i.d. of 70/30 insulin and sliding scale. He has also had diarrhea for the past 2 days, no blood in the bowel movement. He was actively vomiting when I was in there and asking for pain medications. In the ER, he had an elevated white count of 26.6. Initial sugar at 4:00 was 600. His CO2 was down at 9, anion gap of 24, a venous pH of 7.17. Hospitalist Services were contacted for further evaluation.   PAST MEDICAL HISTORY: Diabetes, Crohn's disease, noncompliance, chronic pain.   PAST SURGICAL HISTORY: Foot surgery and a bilateral orchiectomy after trauma.   ALLERGIES: No known drug allergies.   CURRENT MEDICATIONS: Include 70/30 insulin 26 units twice a day, Novolin R sliding scale. I think he also takes Asacol 800 mg 3 times a day.   SOCIAL HISTORY: He states he quit smoking. Occasional marijuana use. No alcohol.   FAMILY HISTORY: Positive for diabetes.    REVIEW OF SYSTEMS:  CONSTITUTIONAL: Positive for fever. Positive for hot feeling. Positive for a tremendous amount of weight loss.  EYES: He  has had some blurry vision.  EARS, NOSE, MOUTH, AND THROAT: Positive for postnasal drip. No sore throat. No difficulty swallowing.   CARDIOVASCULAR: Positive for chest pain. No palpitations.  RESPIRATORY: Positive for shortness of breath. Positive for cough. No sputum. No hemoptysis.  GASTROINTESTINAL: Positive for nausea and vomiting. Positive for abdominal pain. Positive for diarrhea. No bright red blood per rectum. No melena.   GENITOURINARY: No burning on urination or hematuria.  MUSCULOSKELETAL: No joint pain.  INTEGUMENT: No rashes or eruptions.  NEUROLOGICAL: No fainting or blackouts.  PSYCHIATRIC: No anxiety or depression.  ENDOCRINE: No thyroid problems.  HEMATOLOGIC/LYMPHATIC: No anemia.   PHYSICAL EXAMINATION: VITAL SIGNS: Temperature 98.3, pulse 125, respirations 20, blood pressure 147/88, pulse oximetry 96% on room air.  GENERAL:  A critically ill-looking male who is actively vomiting in front of me.  HEENT: Eyes: Conjunctivae and lids normal. Pupils equal, round, and reactive to light. Extraocular muscles intact. No nystagmus. Ears, nose, mouth, and throat: Tympanic membranes no erythema. Nasal mucosa no erythema. Throat no erythema, no exudate seen. Lips and gums no lesions.  NECK: No JVD. No bruits. No lymphadenopathy. No thyromegaly. No thyroid nodules palpated.  LUNGS: Clear to auscultation. No use of accessory muscles to breathe. No rhonchi, rales, or wheeze heard.  CARDIOVASCULAR: S1, S2 normal, tachycardic. No gallops, rubs or murmurs heard. Carotid upstroke 2+ bilaterally. No bruits.  EXTREMITIES: Dorsalis pedis pulses 2+ bilaterally. Trace edema of the lower extremity.  ABDOMEN: Soft. Positive tenderness in the epigastric area. No organomegaly/splenomegaly. Normoactive bowel sounds. No masses felt.  LYMPHATIC: No lymph nodes in the neck.  MUSCULOSKELETAL: No clubbing. Trace edema. No cyanosis.  SKIN: No rashes or ulcers seen.  NEUROLOGICAL: Cranial nerves II through XII grossly intact. Deep tendon reflexes 2+ bilateral lower extremities.  PSYCHIATRIC: The patient is oriented to person, place and time.     LABORATORY AND RADIOLOGICAL DATA:  Venous pH 7.17. Glucose 631,  BUN 22, creatinine 1.25, sodium 137, potassium 4.4, chloride 104, CO2 9, calcium 10.4. Liver function tests:  Alkaline phosphatase 145, AST 10, ALT 12, albumin 3.3, anion gap 24. White blood cell count 26.6, H and H 14.4 and  45.3, platelet count 193. Urinalysis: 1+ blood, 2+ ketones, initial blood glucose greater than 600.   ASSESSMENT AND PLAN: 1.  Diabetic ketoacidosis:  Looking critically ill at this point. I will give 20 units of IV insulin stat and start an insulin drip, admit to the CCU, finger sticks q. 1.  Last hemoglobin A1c from recent admission was greater than 17 showing that there was noncompliance. We will get an endocrine consult in the a.m., serial BMPs.  2.  Nausea, vomiting, abdominal pain and diarrhea:  We will send off stool studies, stat dose of Zofran needed to be given, p.r.n. IV morphine.  I am hesitant on giving lots of morphine with him, but I do think that the patient does take chronic pain medication at home. Since he is vomiting, could be through a little bit of the withdrawal or the vomiting and abdominal pain can be secondary to the diabetic ketoacidosis. Continue to monitor closely.  3.  Leukocytosis. Will hold off on antibiotics. I think this is secondary from the DKA and vomiting.  4.  History of Crohn's disease:  Hold on the Asacol since he is actively vomiting.  5.  I will check a free testosterone levels since the patient did have a history of testicle removal after trauma.    TIME SPENT ON ADMISSION: 55 minutes.   The patient will be admitted to the CCU.  The patient is critically ill.    ____________________________ Herschell Dimes. Renae Gloss, MD rjw:cb D: 04/21/2013 19:27:04 ET T: 04/21/2013 19:43:23 ET JOB#: [id number]  cc: Herschell Dimes. Renae Gloss, MD, <Dictator> [facility] Salley Scarlet MD ELECTRONICALLY SIGNED 04/22/2013 19:18

## 2015-01-29 NOTE — H&P (Signed)
PATIENT NAME:  Roberto [name], Roberto [name] MR#:  [id number] DATE OF BIRTH:  1976/09/12  DATE OF ADMISSION:  04/13/2013  PRIMARY CARE PHYSICIAN:  [facility].   CHIEF COMPLAINT:  Abdominal pain, nausea, vomiting, high blood sugars.   HISTORY OF PRESENTING ILLNESS:  The patient is a 39 year old African American male patient with history of insulin-dependent diabetes mellitus with multiple admissions in the past with DKA and Crohn's disease presents to the hospital with elevated blood sugars, nausea, vomiting, abdominal pain.  The patient has blood sugars at 999.  Ketones normal.  The patient mentions that he has been using his sliding scale insulin with Novolin R.  He was started on Lantus, but he could not afford this.  Not on metformin.  He also ran out of his Asacol.  In the Emergency Room patient has been started on IV fluids.  Will need an insulin drip and admission to CCU being critically ill with nonketotic hyperglycemia and admit to the hospitalist service.   PAST MEDICAL HISTORY: 1.  Insulin-dependent diabetes mellitus with recurrent admissions with DKA and nonketotic hyperglycemia.  2.  Crohn's disease.  3.  Noncompliance.   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   ALLERGIES:  None.   HOME MEDICATIONS:  1.  Novolin R sliding scale.  2.  Asacol 800 mg 3 times a day.   SOCIAL HISTORY:  The patient smokes a pack a day, occasional marijuana use.  No alcohol.   FAMILY HISTORY:  Positive for diabetes.   REVIEW OF SYSTEMS:  CONSTITUTIONAL:  Complains of fatigue.  No weight loss, weight gain.  EYES:  No blurred vision, pain or redness.  EARS, NOSE, THROAT:  No tinnitus, ear pain, hearing loss.  RESPIRATORY:  No dry cough, wheezing, pneumonia.  CARDIOVASCULAR:  No chest pain, orthopnea, edema,  GASTROINTESTINAL:  Has chronic abdominal pain, nausea and vomiting recurrently.  Presently worse.  GENITOURINARY:  No dysuria, hematuria.  Does have polyuria,  frequency.  ENDOCRINE:  Has polyuria, nocturia, polydipsia.  No thyroid problems. HEMATOLOGIC AND LYMPHATIC:  No anemia, easy bruising, bleeding.  INTEGUMENTARY:  No acne, rash, lesions.  MUSCULOSKELETAL:  Has chronic back pain.  No arthritis.  NEUROLOGIC:  No focal numbness, weakness, dysarthria, seizures.  PSYCHIATRIC:  No anxiety or depression.   PHYSICAL EXAMINATION: VITAL SIGNS:  Temperature 98.1, pulse of 108, respirations 18, blood pressure 112/77, saturating 97% on room air.  GENERAL:  Moderately built [place] American male patient lying in bed, comfortable, conversational, cooperative with exam. PSYCHIATRIC:  Is alert, oriented x 3.  Mood and affect appropriate.  Judgment intact.  HEENT:  Atraumatic, normocephalic.  Oral mucosa dry and pink.  External ears and nose normal.  No pallor.  No icterus.  Pupils bilaterally equal and react to light.  No thrush.  NECK:  Supple.  No thyromegaly.  No palpable lymph nodes.  Trachea midline.  No carotid bruit, JVD.  CARDIOVASCULAR:  S1, S2, without any murmurs.  Peripheral pulses 2+.  RESPIRATORY:  Normal work of breathing.  Clear to auscultation on both sides.  GASTROINTESTINAL:  Soft abdomen.  Tenderness diffusely on deep palpation.  No rigidity or guarding.  Bowel sounds present.  No organomegaly palpable.  SKIN:  Warm and dry.  No petechiae, rash, ulcers.  Has a small healing folliculitis area on the left thigh.  MUSCULOSKELETAL:  No joint swelling, redness, effusion of the large joints.  Normal muscle tone.  NEUROLOGICAL:  Moves all four extremities equally.  LYMPHATIC:  No cervical, supraclavicular lymphadenopathy.   LABORATORY, DIAGNOSTIC AND RADIOLOGICAL DATA:  Glucose of 999, BUN 20, creatinine 1.12, sodium 126, potassium 4.5, chloride 88, GFR greater than 60.  AST, ALT, alkaline phosphatase, bilirubin normal.  WBC 8.6, hemoglobin 13.1, platelets of 111.   ASSESSMENT AND PLAN:   1.  Nonketotic hyperglycemia.  The patient has severely  elevated blood sugars of 999.  He also has associated pseudohyponatremia secondary to elevated blood sugars.  We will start him on aggressive IV fluids with insulin drip.  We will admit to CCU with Accu-Cheks q. 1 hour.  The patient will be changed to D5 normal saline once his blood sugars are less than 250.  We will start him on a 70/30 insulin which is more affordable than Lantus for him.  We will start him on 20 units twice daily, put him on a sliding scale later.  The patient will also benefit from starting on metformin at discharge.  2.  Severe dehydration secondary to nonketotic hyperglycemia, polyuria, dehydration.  3.  Mild thrombocytopenia.  The patient has had mild thrombocytopenia in the past, will need to be monitored.  We will place him on Lovenox for his DVT prophylaxis.  This needs to be stopped if his platelets are less than 75.  4.  Crohn's disease.  We will continue his Asacol.  Put him on pain medications as needed.  5.  CODE STATUS:  FULL CODE.   Time spent today on this critically ill patient with nonketotic hyperglycemia being admitted to CCU on an insulin drip is 40 minutes.    ____________________________ Molinda Bailiff Necole Minassian, MD srs:ea D: 04/13/2013 18:18:45 ET T: 04/13/2013 19:23:36 ET JOB#: [id number]  cc: Wardell Heath R. Elber Galyean, MD, <Dictator> [facility] Orie Fisherman MD ELECTRONICALLY SIGNED 04/13/2013 21:29

## 2015-01-29 NOTE — Discharge Summary (Signed)
PATIENT NAME:  Roberto [name], Roberto [name] MR#:  [id number] DATE OF BIRTH:  [birth year]  DATE OF ADMISSION:  11/16/2012 DATE OF DISCHARGE:  11/17/2012  PRESENTING COMPLAINT:  Nausea, vomiting, abdominal pain.   DISCHARGE DIAGNOSES:   1.  Diabetic ketoacidosis, resolved.  2.  Type 2 diabetes.  3.  History of Crohn's disease.   CONDITION ON DISCHARGE:  Fair. Vitals stable.   CODE STATUS:  Full code.   MEDICATIONS:   1.  Asacol HD 800 mg delayed release 1 tablet 3 times a day.  2.  AndroGel packet 50 mg per 5 g 1% transdermal gel 1 packet once a day.  3.  Levemir FlexPen 35 units subcutaneous daily at bedtime.  4.  NovoLog 10 units b.i.d. Continue checking his sugars twice a day.   DIET:  Carbohydrate-controlled diet.   FOLLOWUP:  In 1 to 2 days to [facility].   LABORATORY DATA AT DISCHARGE:  White count is 7.3, H and H are 12.8 and 38.3. Glucose is 117, BUN is 13, creatinine 0.9, sodium 143, potassium 3.2, chloride 110, anion gap is 11. Anion gap on admission was 20.   BRIEF SUMMARY OF HOSPITAL COURSE:  The patient is a 40 year old African American gentleman well known to our service from previous many admissions who comes to the Emergency Room with nausea, vomiting, abdominal pain and was found to be in diabetic ketoacidosis. He was admitted with:  1.  Diabetic ketoacidosis. The patient was admitted to the ICU, received IV fluids, was on insulin drip overnight, anion gap closed and insulin drip was discontinued. The patient was resumed back on his Levemir. He has history of noncompliance with his medications and followup. He was able to tolerate diet well. Care management will help with getting insulin prior to discharge.  2.  Crohn's disease, stable. We will continue Asacol.  3.  Thrombocytopenia, appears chronic.  4.  Androgen deficiency. Continue AndroGel as doing at home.   Hospital stay otherwise remained stable. The patient remained a full code.   TIME SPENT:  40 minutes.     ____________________________ Wylie Hail Allena Katz, MD sap:si D: 11/19/2012 15:21:20 ET T: 11/19/2012 22:04:55 ET JOB#: [id number]  cc: Litsy Epting A. Allena Katz, MD, <Dictator> Willow Ora MD ELECTRONICALLY SIGNED 11/26/2012 0:13

## 2015-01-29 NOTE — H&P (Signed)
PATIENT NAME:  Roberto [name], Roberto [name] MR#:  [id number] DATE OF BIRTH:  [birth year]  DATE OF ADMISSION:  11/16/2012  PRIMARY CARE PHYSICIAN:  [facility]  ED REFERRING PHYSICIAN:  Dr. Shaune Pollack  CHIEF COMPLAINT:  Nausea, vomiting, abdominal pain.   HISTORY OF PRESENT ILLNESS:  The patient is a 39 year old African American male with history of type 1 diabetes who has a history of admissions for diabetic ketoacidosis due to medical noncompliance, who presents to the ED with complaint of having nausea, vomiting and abdominal pain for the past few days. The patient came to the ED and was noted to have a blood sugar of 241. His anion gap was elevated at 20 and was noted to be in DKA. He has received 3 liters of IV fluid normal saline and he is also started on insulin. The patient also reports that he was short of breath and felt dizzy when he got up. He has also had some chills and dry cough. He reports that he is on long-acting Levemir 35 units at bedtime, which he ran out for the past 4 to 5 days. He has been using his NovoLog. He otherwise denies any chest pains, palpitations. He does have history of Crohn's disease. Denies any diarrhea or any blood in his stool.  PAST MEDICAL HISTORY: 1.  Diabetes for 6 years.  2.  History of Crohn's disease.   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   ALLERGIES:  None.   CURRENT MEDICATIONS:  He is on Asacol 800 t.i.d. NovoLog sliding scale based on his sugars. He is on AndroGel 1% 5 grams daily.   SOCIAL HISTORY:  Smokes less than a pack per day. Denies alcohol. Uses marijuana from time-to-time.   FAMILY HISTORY:  Positive for diabetes.  REVIEW OF SYSTEMS:  CONSTITUTIONAL:  Denies any fevers. Complains of fatigueness, weakness, chronic abdominal pain. No weight loss. No weight gain.  EYES:  No blurred or double vision. No redness. No inflammation. No glaucoma. No cataracts.  ENT:  No tinnitus. No ear pain. No hearing loss. No  difficulty swallowing.  RESPIRATORY:  Complains of dry cough. No wheezing. No chronic obstructive pulmonary disease. No pneumonia.  CARDIOVASCULAR:  Denies any chest pain, orthopnea or edema.  GASTROINTESTINAL:  Complains of nausea and vomiting. Has chronic abdominal pain. Denies any hematemesis, no melena, no rectal bleeding.  GENITOURINARY:  Denies any dysuria, hematuria, renal calc or frequency. ENDOCRINE:  Does report polyuria, nocturia. HEMATOLOGIC AND LYMPHATIC:  Denies any major bruisability or bleeding.  SKIN:  Denies any acne or rash.  MUSCULOSKELETAL:  Denies any pain in the neck, back or shoulder. No gout.  NEUROLOGIC:  No numbness. No cerebrovascular accident. No transient ischemic attack.  PSYCHIATRIC:  No anxiety. No insomnia. No ADD. No OCD.   PHYSICAL EXAMINATION: VITAL SIGNS:  Temperature 98.6, pulse 90, respirations 24, blood pressure 130/80.  GENERAL:  The patient is a well-developed [place] American male, appears very dehydrated.  HEENT:  Pupils are equally round, react to light and accommodation. There is no conjunctival pallor. No scleral icterus. Nasal exam shows no drainage or ulceration.  Oropharynx is dry. NECK:  No thyromegaly. No carotid bruits.  CARDIOVASCULAR:  Regular rate and rhythm. No murmurs, rubs, clicks or gallops. PMI is not displaced.  LUNGS:  Clear to auscultation bilaterally without any rales, rhonchi or wheezing.  ABDOMEN:  Soft, nontender, nondistended. Positive bowel sounds x 4. He does have some epigastric tenderness which apparently is chronic.  EXTREMITIES:  No clubbing, cyanosis or edema.  SKIN:  Very dry. No rash.  LYMPHATICS:  No lymph nodes palpable.  NEUROLOGICAL:  Awake, alert, oriented x 3. No focal deficits.  PSYCHIATRIC:  Not anxious or depressed.  EVALUATIONS IN THE EMERGENCY DEPARTMENT:  BMP:  Glucose was 641, BUN 20, creatinine 1.21, sodium 132, potassium 4.2, chloride 97, CO2 is 15, calcium 9.3. His anion gap is 20. LFTs showed  total protein of 8.5, otherwise, AST and ALT are normal. CBC:  WBC 8.2, hemoglobin 14.8, platelet count is 112.   ASSESSMENT AND PLAN:  The patient is a 39 year old African American male with history of diabetes type 1 on insulin, presents with nausea, vomiting, abdominal pain, noted to be in diabetic ketoacidosis.  1.  Diabetic ketoacidosis:  At this time we will give him aggressive IV fluids. Follow his blood sugars per ccu protocol every hour. Adjust his insulin based on that. We will also follow his BMP and replace his potassium as needed and other electrolytes as needed. Continue him on the insulin drip protocol, as well as continue him on IV fluids. The patient will need assistance with his discharge Levemir, which he ran out of. He will also need to be arranged to reset appointment at [facility].  2.  Crohn's disease:  We will continue his Asacol.  3.  Thrombocytopenia:  Unclear whether this is chronic or not. No baseline currently available. Will need to follow CBC in the morning and check his previous platelet counts.  4.  Androgen deficiency:  Continue AndroGel as using at home. 5.  Miscellaneous:  The patient will be placed on Lovenox for deep vein thrombosis prophylaxis.   TIME SPENT:  35 minutes of critical care time.    ____________________________ Lacie Scotts Allena Katz, MD shp:ce D: 11/16/2012 16:33:15 ET T: 11/16/2012 16:48:14 ET JOB#: [id number]  cc: Janielle Mittelstadt H. Allena Katz, MD, <Dictator> Charise Carwin MD ELECTRONICALLY SIGNED 11/19/2012 10:18

## 2015-01-30 NOTE — Consult Note (Signed)
PATIENT NAME:  Roberto [name], Roberto [name] MR#:  [id number] DATE OF BIRTH:  Feb 28, 1976  DATE OF CONSULTATION:  11/14/2013  REFERRING PHYSICIAN:  Rolly Pancake. Cherlynn Kaiser, MD CONSULTING PHYSICIAN:  Keturah Barre, NP  REASON FOR CONSULTATION: GI consult ordered by Dr. Cherlynn Kaiser to evaluate for abdominal pain, nausea, vomiting, diarrhea.   Appreciate consult for 39 year old [place] American man admitted with right lower quadrant abdominal pain, nausea, vomiting, diarrhea. This is a patient with history of diabetes, Crohn's based on biopsies from colonoscopy in 2010 demonstrating active and chronic ileitis, and chronic lower back pain. Noted several imaging studies with findings of ankylosing spondylitis over the last few years. The patient relates that he has been on Asacol 800 mg b.i.d. until last September. States while he was on this, he did not have diarrhea often and his abdominal pain was well-controlled. Unsure why he is no longer taking this. Evidently, the abdominal pain/diarrhea started mid December. Was hospitalized last month for it and treated for Crohn's with antibiotics, steroids. Had 2 CTs, one with inflammation to the distal colon. Last was improved. Had colonoscopy 01/15 that revealed a hyperplastic polyp but no apparent inflammation to the TI, although this was not biopsied. There was a spastic colon that was noted. Was discharged on some hyoscyamine but ran out. Says it helped a little bit but not so much. No history of EGD. Reports that his abdominal pain is primarily in the right lower quadrant. Food exacerbates it and triggers diarrheal episodes about 15 minutes after eating. Sometimes he has maroon streaks in the diarrhea. Afraid to eat. Three episodes of clear emesis over the last few days, persistent nausea. States he has lost about 100 pounds over the last year. Denies heartburn, hematemesis, melena, problems swallowing. Denies rash, mouth sores. Does report lower back pain with numbness and  tingling down his groin and both legs. States he cannot bend over. No other complaints. Labs with low potassium, normal WBC, slight anemia. Two-view abdominal film with fluid levels in the proximal colon, questionable for infection. Also note on the x-ray regarding that there are spondylotic changes to the spines consistent with IBD versus spondyloarthropathy. C. difficile test pending. Results may be somewhat skewed, as he has received Flagyl and Cipro already as a one-time dose. Of note, did have negative HIV and HCV labs within the last several months.   PAST MEDICAL HISTORY: Crohn's, diabetes, questionable medical noncompliance, chronic abdominal pain, diabetic neuropathy, previous DKA.   ALLERGIES: NKDA.   SOCIAL HISTORY: Smokes 1/2 pack cigarettes a day for 15+ years. No EtOH. Occasional marijuana. No other illicits. Lives in a shelter.   FAMILY HISTORY: Significant for mother with diabetes and Crohn's, father for unknown type of cancer.   CURRENT MEDICATIONS: Humulin R sliding scale, Levemir 26 units at bedtime. At one point was on Aleve, AndroGel, Asacol, hyoscyamine and gabapentin; however, he is no longer taking these.   REVIEW OF SYSTEMS: Ten systems reviewed, unremarkable other than what is noted above.   LABORATORY AND RADIOLOGICAL DATA: Most recent labs: Glucose 219, BUN 5, creatinine 0.64, sodium 138, potassium 3.1. This is being replaced with his current IV fluid order. GFR greater than 60. Lipase 84. Total protein 6.8, albumin 3.1, total bilirubin 0.6, ALP 76, AST 7, and ALT 22. WBC 9.6, hemoglobin 12.8, hematocrit 36.9, platelet count 134, red cells normocytic. Two-view abdomen with proximal fluid levels in the proximal colon, questionable for infection, questionable ankylosing spondylitis.   PHYSICAL EXAMINATION: VITAL SIGNS: Most recent: Temperature 98.5, pulse  108, respiratory rate 17, blood pressure 150/98, SaO2 of 98%.  GENERAL: Well-appearing man in no acute distress.   HEENT: Normocephalic, atraumatic. No redness, drainage, or inflammation to the eyes or the nares. No mouth sores. Sclerae clear. Mucous membranes pink and moist.  NECK: Supple. No JVD, lymphadenopathy, thyromegaly.  CARDIAC: S1, S2, RRR. No MURMURS, RUBS OR GALLOPS. LUNGS: Clear. Respirations eupneic.  ABDOMEN: Flat, soft. Diffuse tenderness, primarily in the right lower quadrant. No guarding, rigidity, peritoneal signs, hepatosplenomegaly, or other abnormalities.  EXTREMITIES: MAEW x 4. No appreciable edema.  SKIN: Warm, dry, pink. No erythema, lesion or rash, particularly no signs of perianal abscess.  NEUROLOGIC: Alert, oriented x 3. Speech clear. No facial droop.   IMPRESSION AND PLAN: Right lower quadrant abdominal pain, diarrhea, history of Crohn's. Cannot exclude active Crohn's. This may be more present in the small bowel as opposed to the large colon, so we will assess an IBD panel, CRP level,  and small bowel follow-through. Due to concerns of gastroparesis, will assess a gastric emptying study. We will also start Cipro 400 mg IV b.i.d. and Flagyl 250 mg p.o. q.i.d. We will follow with you.   Thank you very much for this consult.   These services were provided by Vevelyn Pat, MSN, [facility], in collaboration with Christena Deem, MD, with whom I have discussed this patient in full.   ____________________________ Keturah Barre, NP chl:jcm D: 11/14/2013 19:29:36 ET T: 11/14/2013 19:43:55 ET JOB#: [id number]  cc: Keturah Barre, NP, <Dictator> Eustaquio Maize Eliah Ozawa FNP ELECTRONICALLY SIGNED 12/10/2013 8:19

## 2015-01-30 NOTE — Discharge Summary (Signed)
PATIENT NAME:  Roberto [name], Roberto [name] MR#:  [id number] DATE OF BIRTH:  December 16, 1975  DATE OF ADMISSION:  10/27/2013 DATE OF DISCHARGE:  10/31/2013  DISCHARGE DIAGNOSES:   1.  Nausea, vomiting, diarrhea, likely due to acute gastroenteritis viral in nature with colonoscopy showing no signs of Crohn's disease.  It show a lot of spasm throughout muscles of the bowel wall except the rectal area, started on hycoscyamine which seemed to help.  2.  Gram positive rods growing Corynebacterium jeikeium in the aerobic bottle only, suggestive of contamination with skin flora (false positive) blood culture.  No symptoms.  3.  Diabetic neuropathy, started on Neurontin which seemed to help.  4.  Systemic inflammatory response syndrome, resolved.    SECONDARY DIAGNOSES: 1.  Diabetes.  2.  Multiple admissions for diabetic ketoacidosis.  3.  History of medication noncompliance.  4.  Crohn's disease.  5.  Chronic pain. 6.  Neuropathy.  7.  Foot surgery.   CONSULTATIONS:  GI, Dr. Lynnae Prude.   PROCEDURES AND RADIOLOGY:  Colonoscopy by Dr. Mechele Collin on 21st of January showed no signs of Crohn's disease.  Significant spasm of the entire colon except rectum.  CT scan of the abdomen and pelvis with contrast on 19th of January showed no acute pathology.   MAJOR LABORATORY PANEL:  UA on admission was negative.   Blood cultures x 1 was false positive growing organism consistent with skin flora bacteria.   Stool for C. difficile was negative.  Stool for ova & parasites was negative.   Stool for ova and parasites was negative.   HISTORY AND SHORT HOSPITAL COURSE:  The patient is a 39 year old male with the above-mentioned medical problems who was admitted for nausea, vomiting, abdominal pain and diarrhea concerning for some GI pathology.    GI consultation was obtained with Dr. Mechele Collin who recommended colonoscopy due to the patient's history of Crohn's disease which was performed on the 21st of January and not showing  any signs of Crohn's.  The patient did have a significant spasm of the entire colon except the rectal area for which hyoscyamine was started which seemed to help him.  He tolerated diet and was discharged home on the 23rd of January as his pain was well-controlled.   PHYSICAL EXAMINATION:  VITAL SIGNS:  On the date of discharge, his vital signs are as follows: Temperature 98.8, heart rate 96 per minute, respirations 20 per minute, blood pressure 145/98 mmHg.  He was saturating 99% on room air.  Pertinent physical examination on the date of discharge:  CARDIOVASCULAR:  S1, S2 normal.  No murmurs, rubs or gallop.  LUNGS:  Clear to auscultation bilaterally.  No wheezing, rales, rhonchi or crepitation.  ABDOMEN:  Soft, benign.  NEUROLOGIC:  Nonfocal examination.  All other physical examination remained at baseline.   DISCHARGE MEDICATIONS: 1.  Humulin R 3 times a day sliding scale as needed.  2.  Asacol 800 mg by mouth twice daily.  3.  Insulin Levemir 26 units subQ daily.  4.  AndroGel two pumps transdermal daily.  5.  Aleve 440 mEq/kg once daily orally.  6.  Tylenol 500 mg by mouth every eight hours as needed.   7.  Gabapentin 100 mg by mouth three times a day.  8.  Hyoscyamine 0.25 mg by mouth sublingual 3 times a day as needed.   DISCHARGE DIET:  Regular, eat light for the first meal.   DISCHARGE ACTIVITY:  As tolerated.   DISCHARGE INSTRUCTIONS AND FOLLOW-UP:  The  patient was instructed to follow up with his primary care physician at [facility] in 1 to 2 weeks.  He will need follow-up with [facility], Dr. Lynnae Prude, in 2-4 weeks.   Total time discharging this patient was 55 minutes.   Please note this patient remains at very high risk for readmission considering his financial status with medication noncompliance and possible narcotic abuse.    ____________________________ Ellamae Sia. Sherryll Burger, MD vss:ea D: 11/01/2013 18:04:15 ET T: 11/02/2013 00:50:10  ET JOB#: [id number]  cc: Yuleni Burich S. Sherryll Burger, MD, <Dictator> [facility] Scot Jun, MD Ellamae Sia [facility] MD ELECTRONICALLY SIGNED 11/04/2013 13:36

## 2015-01-30 NOTE — H&P (Signed)
PATIENT NAME:  Roberto [name], Roberto [name] MR#:  [id number] DATE OF BIRTH:  1976-05-13  DATE OF ADMISSION:  11/14/2013  PRIMARY CARE PHYSICIAN: [facility].   CHIEF COMPLAINT: Abdominal pain, nausea, vomiting, and diarrhea.   HISTORY OF PRESENT ILLNESS: This is a 39 year old male who presents to the hospital due to abdominal pain, nausea, vomiting, and diarrhea that has progressively gotten worse over the past few days. The patient was recently hospitalized last month for similar symptoms, thought to have possible exacerbation of Crohn's disease, although he had a colonoscopy done which showed no evidence of any acute terminal ileum or colonic inflammation. He was thought to have colonic spasms and therefore started on Bentyl. The patient says that he has been taking his Bentyl, but it has not improved his symptoms. She said the moment he tries to eat something he develops nausea and vomiting and he has diarrhea regardless of whether he is eating or drinking. His vomiting is clear in nature and nonbloody. His diarrhea is somewhat green in color. He denies any fevers, chills. He denies any weight loss. He denies any chest pain, shortness of breath, or any other associated symptoms. The patient was brought to the ER and was treated supportively with IV pain meds and IV antiemetics, although his symptoms still continue to persist. Hospitalist services were contacted for further treatment and evaluation.   REVIEW OF SYSTEMS: CONSTITUTIONAL: No documented fever. No weight gain. No weight loss.  EYES: No blurry or double vision.  ENT: No tinnitus. No postnasal drip. No redness of the oropharynx.  RESPIRATORY: No cough, no wheeze, no hemoptysis, no dyspnea.  CARDIOVASCULAR: No chest pain, no orthopnea, no palpitations, no syncope.  GASTROINTESTINAL: Positive nausea. Positive vomiting. Positive diarrhea. Positive generalized abdominal pain. No melena or hematochezia.  GENITOURINARY: No dysuria or hematuria.   ENDOCRINE: No polyuria or nocturia. No heat or cold intolerance.  HEMATOLOGIC: No anemia, no bruising, no bleeding.  INTEGUMENTARY: No rashes. No lesions.  MUSCULOSKELETAL: No arthritis, no swelling, no gout.  NEUROLOGIC: No numbness or tingling. No ataxia. No seizure-type activity.  PSYCHIATRIC: No anxiety. No insomnia. No ADD.  PAST MEDICAL HISTORY: Consistent with suspected history of Crohn disease, diabetes, medical noncompliance, chronic abdominal pain, diabetic neuropathy, and previous DKA.   ALLERGIES: No known drug allergies.   SOCIAL HISTORY: Does smoke about 1/2 pack per day. Has been smoking for the past 15+ years. No alcohol abuse. Does occasionally smoke marijuana. No other illicit drug abuse. Lives in a shelter.   FAMILY HISTORY: Both mother and father are deceased. Father died from malignancy. Mother had diabetes and Crohn disease.   CURRENT MEDICATIONS: Aleve 440 mg daily, AndroGel transdermal gel 2 pumps daily, Asacol 800 mg b.i.d., gabapentin 100 mg b.i.d., Humulin Regular per sliding scale, hyoscyamine 0.25 mg sublingual t.i.d. as needed, Levemir 26 units at bedtime, Tylenol 500 mg q. 8 hours as needed.   PHYSICAL EXAMINATION: Presently is as follows:  VITAL SIGNS: Temperature 98.6, pulse 107, respirations 20, blood pressure 142/92, and sats 98% on room air.  GENERAL: The patient is a pleasant-appearing male in no apparent distress.  HEENT: Atraumatic, normocephalic. Extraocular muscles are intact. Pupils are equal and reactive to light. Sclerae anicteric. No conjunctival injection. No pharyngeal erythema.  NECK: Supple. There is no jugular venous distention. No bruits, no lymphadenopathy or thyromegaly.  HEART: Regular rate and rhythm. No murmurs, no rubs, and no clicks.  LUNGS: Clear to auscultation bilaterally. No rales or rhonchi. No wheezes.  ABDOMEN: Soft, flat.  Tender diffusely. No rebound. No rigidity. Good bowel sounds. No hepatosplenomegaly appreciated.   EXTREMITIES: No evidence of any cyanosis, clubbing, or peripheral edema. Has +2 pedal and radial pulses bilaterally.  NEUROLOGIC: The patient is alert and oriented x3 with no focal motor or sensory deficits appreciated bilaterally.  SKIN: Moist and warm with no rashes appreciated.  LYMPHATIC: There is no cervical or axillary lymphadenopathy.   LABORATORY AND DIAGNOSTICS: Serum glucose 219, BUN 5, creatinine 0.6, sodium 138, potassium 3.1, chloride 103, bicarb 31. The patient's LFTs are within normal limits. White cell count 9.6, hemoglobin 12.8, hematocrit 36.9, platelet count 134.   The patient did have AN abdominal 3-way which showed nonspecific colonic gas, but no other acute abnormality noted.   ASSESSMENT AND PLAN: This is a 39 year old male with a history of diabetes, medical noncompliance, history of previous diabetic ketoacidosis, history of Crohn disease and chronic abdominal pain who presents to the hospital with abdominal pain, nausea, vomiting, and diarrhea.  1.  Abdominal pain, nausea, vomiting, and diarrhea. The exact etiology of this is unclear. The patient has had a recent hospitalization with extensive work-up including a CT of abdomen and pelvis and a colonoscopy which showed no acute pathology other than colonic spasms. He does have a previous history of suspected Crohn disease, but on colonoscopy recently there was no terminal ileum inflammation. He does have underlying diabetes, which has been controlled. Therefore, there is a concern that this could be underlying gastroparesis. For now I will continue supportive care with intravenous fluids, antiemetics, pain control. Will consider starting him on Reglan, but will await further gastroenterology input. Will check stool for Clostridium difficile and comprehensive culture, but both of these were negative on his last hospitalization. I discussed the case with Dr. Marva Panda from gastroenterology who will also see the patient.  2.   Diabetes. Since the patient is going to be n.p.o., except medications, I will hold his Levemir, place him on sliding scale insulin coverage for now. 3.  Diabetic neuropathy. Continue with his Neurontin.  4.  Hypokalemia. I will go ahead and replace his potassium accordingly and repeat it in the morning.   CODE STATUS: The patient is a FULL code.   TIME SPENT ON ADMISSION: 50 minutes. ____________________________ Rolly Pancake. Cherlynn Kaiser, MD vjs:sb D: 11/14/2013 11:59:21 ET T: 11/14/2013 12:20:10 ET JOB#: [id number]  cc: Rolly Pancake. Cherlynn Kaiser, MD, <Dictator> [place] Siren MD ELECTRONICALLY SIGNED 11/22/2013 17:59

## 2015-01-30 NOTE — Consult Note (Signed)
PATIENT NAME:  Roberto [name], Roberto [name] MR#:  [id number] DATE OF BIRTH:  1975/12/17  DATE OF CONSULTATION:  10/28/2013  CONSULTING PHYSICIAN:  Hardie Shackleton. Megyn Leng, PA-C and Dr. Lynnae Prude   REFERRING PHYSICIAN:  Dr. Jacques Navy  REASON FOR CONSULTATION: Nausea, vomiting and diarrhea with a history of Crohn disease.   HISTORY OF PRESENT ILLNESS: This is a pleasant 39 year old African American gentleman who was diagnosed with Crohn disease back in 2010 per the patient via a colonoscopy. He cannot tell me exactly where his Crohn's is located, but he suspects it was near the TI. He has not had a repeat colonoscopy since that time and has been maintained on Asacol therapy for this. He reports that for the past 1 month, he has been having intermittent abdominal pains but over the past 1 week, it has gotten significantly worse and now with the addition of nausea, vomiting and diarrhea. He has had to get up in the middle of the night to use the restroom and has been going nearly every 20 minutes postprandial. He does notice that the nausea, vomiting and diarrhea seem to be postprandial and if he does not eat, he will not have as much discomfort. Nearly every time he has a bowel movement, he will notice blood and it is described as bright red blood on the toilet paper but occasionally will be mixed throughout the consistency of the stool. He has not had any recent fever or chills. His white blood cells were normal and his liver enzymes were normal as well. Hemoglobin is currently 12.6. His stool was checked for C. difficile and this was negative. Culture ova and parasite and heme guaiac are still currently pending. Per the patient, the sensation that he is feeling does feel similar to a Crohn's flare as opposed to a potential infectious etiology. At admission, he was started on Cipro and Flagyl as well as IV steroids. He was also clinically dehydrated and was started on IV fluids. He has been kept n.p.o. since that time. When  speaking with the patient today, he has been kept n.p.o. and is complaining of hunger and the nausea and vomiting has resolved. His last bowel movement was at 3 in the morning last night, but the pain unfortunately has remained on unchanged and seems to be located just below the umbilicus.   ALLERGIES: No known drug allergies.   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   PAST SURGICAL HISTORY: Orchiectomy bilateral status post trauma, foot surgery.   HOME MEDICATIONS: Insulin, Levemir and Asacol.   SOCIAL HISTORY: The patient denies any alcohol use. He does smoke about 1/2 pack per day of tobacco and uses occasional marijuana. He does reside in a shelter.   FAMILY HISTORY: No known family history of GI malignancy, colon polyps or IBD.   REVIEW OF SYSTEMS:  Ten system review of systems was obtained on the patient. Pertinent positives are mentioned above and otherwise negative.   PHYSICAL EXAMINATION: VITAL SIGNS: Blood pressure 142/93, heart rate 81, respirations 20, temperature 98.2, bedside pulse oximetry 98%.  GENERAL: This is a pleasant 39 year old African American gentleman resting quietly and comfortably in the exam room, in no acute distress. Alert and oriented x3.  HEAD: Atraumatic, normocephalic.  NECK: Supple. No lymphadenopathy noted.  HEENT: Sclerae anicteric. Mucous membranes moist.  PULMONARY: Respirations are even and unlabored. Clear to auscultation bilateral anterior lung fields.  CARDIAC: Regular rate and rhythm. S1, S2 noted.  ABDOMEN: Soft, nondistended. Positive tenderness to palpation is noted just below the umbilicus but no guarding or rebound. No signs of an acute abdomen. Normoactive bowel sounds noted in all 4 quadrants. No masses, hernias or organomegaly appreciated.  RECTAL: Deferred. PSYCHIATRIC:  Appropriate mood and affect.  EXTREMITIES: Negative for lower extremity edema.  Sensation does seem diminished in his lower extremities, 2+ pulses noted in bilateral upper extremities.   LABORATORY DATA: White blood cells 5.8, hemoglobin 12.6, hematocrit 36.6, platelets 134, lipase 82. Troponins are negative. Sodium 134, potassium 3.7, BUN 6, creatinine 0.63, glucose 258, bilirubin 0.6, alkaline phosphatase 81, ALT 29, AST 20.   IMAGING: CT of the abdomen and pelvis with contrast was obtained, and compared to a prior exam. Terminal ileum and small bowel appeared unremarkable. The previously seen discontinuous sigmoid wall thickening was no longer seen. Overall, it was a negative scan for any acute changes.   ASSESSMENT: 1.  Crohn disease of the TI. 2.  Nausea and vomiting. 3.  Periumbilical abdominal pain and right lower quadrant abdominal pain.  4.  Diarrhea. Last bowel movement was at 3 in the morning last night.  Clostridium difficile is negative. Other stool studies are pending.  5.  History of diabetes mellitus with neuropathy and recurring episodes of diabetic ketoacidosis.   PLAN: I have discussed this patient's case in detail with Dr. Lynnae Prude who is involved in the development of the patient's plan of care. Certainly the overall clinical presentation could be a current Crohn disease flare even with an unremarkable. CT scan. Therefore, we do agree with the patient being on IV steroids but we also agree with him being on IV antibiotics for the time being. Certainly, we will await the remainder of the stool studies including a culture, O and P and Hemoccults. He is n.p.o. status right now and is complaining of hunger and therefore, we will start him on water and ice chips today and if he can tolerate this well, we are comfortable with him advancing to a clear liquid diet to follow. Certainly, should he be continuing to experience abdominal pain and/or diarrhea, he may be a candidate for a colonoscopy prior to discharge. He admits that he has not had a colonoscopy since  about 2010 when he was first diagnosed with Crohn's disease. Therefore, we do recommend the above medications, IV fluids. We will await the stool studies and certainly pending his response over the next day or so we can determine whether or not he could benefit from an inpatient colonoscopy. This was explained to the patient and he verbalized understanding and all questions were answered.   ADDENDUM: Colonoscopy reviewed. It was performed by Dr Marva Panda on 05/20/2009 and there was ulcerations and inflammation in the Terminal Ileum. Last EGD ws 05/18/2009 notable for a hiatal hernia and chronic gastritis.   Thank you so much for this consultation and for allowing [place] to participate in the patient's plan of care.   ATTENDING GASTROENTEROLOGIST: Dr. Mechele Collin.  ____________________________ Hardie Shackleton. Naiyah Klostermann, PA-C kme:dp D: 10/28/2013 13:31:01 ET T: 10/28/2013 13:56:21 ET JOB#: [id number]  cc: Hardie Shackleton. Hersel Mcmeen, PA-C, <Dictator> Hardie Shackleton Marlene Beidler PA ELECTRONICALLY SIGNED 10/28/2013 14:28

## 2015-01-30 NOTE — Consult Note (Signed)
Chief Complaint:  Subjective/Chief Complaint seen for abdominalpain and diarrhea.  minimal nausea today, no emesis, had ugis. loose stool.   VITAL SIGNS/ANCILLARY NOTES: **Vital Signs.:   09-Feb-15 14:29  Vital Signs Type Routine  Temperature Temperature (F) 98.5  Celsius 36.9  Temperature Source oral  Pulse Pulse 119  Respirations Respirations 18  Systolic BP Systolic BP 141  Diastolic BP (mmHg) Diastolic BP (mmHg) 101  Mean BP 114  Pulse Ox % Pulse Ox % 99  Pulse Ox Activity Level  At rest  Oxygen Delivery Room Air/ 21 %   Brief Assessment:  Cardiac Regular   Respiratory clear BS   Gastrointestinal details normal Soft  Nondistended  No masses palpable  Bowel sounds normal  No rebound tenderness  mild tenderness to palpation in the rlq   Radiology Results: XRay:    09-Feb-15 12:25, Small Bowel  Small Bowel   REASON FOR EXAM:    abdominal pain, NVD, further eval of crohns disease  COMMENTS:       PROCEDURE: FL  - FL SMALL BOWEL  - Nov 17 2013 12:25PM     CLINICAL DATA:  Abdominal pain with nausea and vomiting    EXAM:  SMALL BOWEL SERIES    TECHNIQUE:  Following ingestion of thin barium, serial small bowel images were  obtained including spot views of the terminal ileum.    COMPARISON:  None.  FLUOROSCOPY TIME:  2 min 48 seconds    FINDINGS:  Image in fluoroscopic evaluation of the small bowel was performed.  Contrast reaches the colon after 3 hr. There is no demonstrable  fixation, angulation, tethering, or mucosal lesion. There is no  extrinsic mass or small bowel obstruction. The terminal ileum  appears normal.     IMPRESSION:  Study within normal limits.      Electronically Signed    By: Bretta Bang M.D.    On: 11/17/2013 12:43         Verified By: Rutherford Guys. WOODRUFF, M.D.,   Assessment/Plan:  Assessment/Plan:  Assessment 1) patient admitted with c/o diarrhea and abdominalpain.  no further n/v.   fewer bms.   abdominalpain improving  slowly.  SBS normal. 2) IDDM   Plan 1) gastric emptying study tomorrow.  note diet advancement.  awaiting serologies for inflammatory bowel disease.   Electronic Signatures: Barnetta Chapel (MD)  (Signed 09-Feb-15 15:40)  Authored: Chief Complaint, VITAL SIGNS/ANCILLARY NOTES, Brief Assessment, Radiology Results, Assessment/Plan   Last Updated: 09-Feb-15 15:40 by Barnetta Chapel (MD)

## 2015-01-30 NOTE — Consult Note (Signed)
Chief Complaint:  Subjective/Chief Complaint Paitne seen and examined, chart reviewed.   Patient presenting with increasing right lower quadrant  pain in the setting of previous h/o ileitis.  However most recent colonoscopy only a month ago negative for ileitis.  Further evaluation as outlined in full GI consult.  In regards to boney abnormalities noted, patient has had HLA B27 and ana in the past negative.  Following.   VITAL SIGNS/ANCILLARY NOTES: **Vital Signs.:   06-Feb-15 21:46  Vital Signs Type Routine  Temperature Temperature (F) 98.8  Celsius 37.1  Temperature Source oral  Pulse Pulse 95  Respirations Respirations 18  Systolic BP Systolic BP 811  Diastolic BP (mmHg) Diastolic BP (mmHg) 914  Mean BP 118  BP Source  if not from Vital Sign Device RN notified  Pulse Ox % Pulse Ox % 95  Pulse Ox Activity Level  At rest  Oxygen Delivery Room Air/ 21 %   Brief Assessment:  Cardiac Regular   Respiratory clear BS   Gastrointestinal details normal Soft  Nondistended  Bowel sounds normal  No rebound tenderness  positive tenderness in the right lower quadrant predominantly   Lab Results: Hepatic:  06-Feb-15 09:00   Bilirubin, Total 0.6  Alkaline Phosphatase 76 (45-117 NOTE: New Reference Range 08/29/13)  SGPT (ALT) 22  SGOT (AST)  7  Total Protein, Serum 6.8  Albumin, Serum  3.1  Routine Chem:  06-Feb-15 09:00   Glucose, Serum  219  BUN  5  Creatinine (comp) 0.64  Sodium, Serum 138  Potassium, Serum  3.1  Chloride, Serum 103  CO2, Serum 31  Calcium (Total), Serum 9.0  Osmolality (calc) 280  eGFR (African American) >60  eGFR (Non-African American) >60 (eGFR values <80m/min/1.73 m2 may be an indication of chronic kidney disease (CKD). Calculated eGFR is useful in patients with stable renal function. The eGFR calculation will not be reliable in acutely ill patients when serum creatinine is changing rapidly. It is not useful in  patients on dialysis. The eGFR  calculation may not be applicable to patients at the low and high extremes of body sizes, pregnant women, and vegetarians.)  Anion Gap  4  Lipase 84 (Result(s) reported on 14 Nov 2013 at 09:21AM.)  Routine UA:  06-Feb-15 12:09   Color (UA) AMBER  Clarity (UA) CLEAR  Glucose (UA) NEGATIVE  Bilirubin (UA) NEGATIVE  Ketones (UA) NEGATIVE  Specific Gravity (UA) 1.015  Blood (UA) NEGATIVE  pH (UA) 5.0  Protein (UA) NEGATIVE  Nitrite (UA) NEGATIVE  Leukocyte Esterase (UA) NEGATIVE (Result(s) reported on 14 Nov 2013 at 01:10PM.)  RBC (UA) NONE SEEN  WBC (UA) RARE  Bacteria (UA) NEGATIVE  Epithelial Cells (UA) 5-15 / HPF  Other 1 (UA) MUCUS PRESENT  Result(s) reported on 14 Nov 2013 at 01:10PM.  Routine Hem:  06-Feb-15 09:00   WBC (CBC) 9.6  RBC (CBC) 4.63  Hemoglobin (CBC)  12.8  Hematocrit (CBC)  36.9  Platelet Count (CBC)  134 (Result(s) reported on 14 Nov 2013 at 09:19AM.)  MCV 80  MCH 27.6  MCHC 34.6  RDW 13.1   Radiology Results: XRay:    06-Feb-15 09:20, Abdomen Flat and Erect  Abdomen Flat and Erect   REASON FOR EXAM:    abd pain  COMMENTS:       PROCEDURE: DXR - DXR ABDOMEN 2 V FLAT AND ERECT  - Nov 14 2013  9:20AM     CLINICAL DATA:  Abdominal pain. Right lower quadrant pain. Bloody  diarrhea.  EXAM:  ABDOMEN - 2 VIEW    COMPARISON:  None.    FINDINGS:  The bowel gas pattern is nonobstructive. There is no plain film  evidence of free air. There does appear to be a fluid level in the  hepatic flexure the colon which is abnormal but nonspecific. This  can be associated with enteric infectionor colitis. Stool and bowel  gas extends to the rectosigmoid. No organomegaly or mass lesion.  Confluent ossification of the lumbar spine and ankylosis of the SI  joints compatible with ankylosing spondylitis.     IMPRESSION:  1. Nonspecific fluid levels within the proximal colon. These are  most commonly associated with enteric infection.  2. Bony findings  compatible with ankylosing spondylitis. This raises  the possibility of inflammatory bowel disease and enteropathic  spondyloarthropathy and inflammatory bowel disease as the cause for  colonic fluid levels. Old report states history of Crohn's disease  as the indication.  Electronically Signed    By: Dereck Ligas M.D.    On: 11/14/2013 09:29         Verified By: Melvyn Novas, M.D.,   Electronic Signatures: Loistine Simas (MD)  (Signed 06-Feb-15 22:34)  Authored: Chief Complaint, VITAL SIGNS/ANCILLARY NOTES, Brief Assessment, Lab Results, Radiology Results   Last Updated: 06-Feb-15 22:34 by Loistine Simas (MD)

## 2015-01-30 NOTE — Consult Note (Signed)
Pt colonoscopy shows absolutely no signs of Crohn's disease at this time.  Terminal ileum entered for several cm.  one small tic seen in sigmoid colon, tiny polyp and 2 bumps cold biopsied in sigmoid colon, small hemorrhoids noted.  Will contact hospitalist, could go home tomorrow.  Regular diet. He did have significant spasm of the entire colon except rectum, recommend antispasmodic medicine such as hyoscyamine or Bentyl. Pt lives in shelter and may have difficulty obtaining any medicine.   Electronic Signatures: Scot Jun (MD)  (Signed on 21-Jan-15 16:07)  Authored  Last Updated: 21-Jan-15 16:07 by Scot Jun (MD)

## 2015-01-30 NOTE — Consult Note (Signed)
Brief Consult Note: Diagnosis: abdominal pain, NVD.   Patient was seen by consultant.   Consult note dictated.   Comments: Appreciate consult for 39 y/o [place] American man admitted with RLQ abdominal pain, NVD. Patient with history of DM, crohns based on biopsies from  colonoscopy 2010 (active/chronic ileitis), and chronic lower back pain. Noted several imaging studies with findings of ankylosing spondylitis over the last few years. Patient relates that he had been on asacol 800mg  bid until last September. States while he was on this, he did not have diarrhea often and abdominal pain was controlled. Unsure why he is no longer taking this. Evidentally the abd pain/ diarrhea started mid-December. Was hospitalized last month for it and treated for crohns with antibiotics/steroids. Had 2 CTs- one with inflammation to distal colon, last was better.  Had colonoscopy 1/15 that revealed a hyperplastic polyp but no apparent inflammation.  A spastic colon was noted. Was discharged with some hyoscyamine but ran out. Says it helped a little but not much. No history of EGD.  Reports that his abdominal pain is primarily in the RLQ: food exacerbates it and triggers diarrheal stools about 57m after eating. Sometimes sees marroon streaks in his diarrhea. Afraid to eat. 3 episodes of clear emesis over the last few days. Persistent nausea. States he has lost about 100lbs over the last year. Denies heartburn, hematemesis, melena, problems swallowing.  Denies rash, mouth sores. Does report lower back pain with numbness/tingling down his groin and both legs. States he cannot bend over. No other complaints. Labs with low K+, normal wbc, slight anemia. 2 view abdominal film with fluid levels in the proximal colon, questionable for infection. Also with note of ankylosing spondylitis changes to spine, consistent with IBD v. spondylarthropathy. C-diff test pending: results may be somewhat skewed as he received flagyl and cipro  already as a one time dose Impression and plan:  RLQ abd pain, diarrhea, hx of Crohns. Cant excl active crohns: may be more in the sm bowel as opposed to lg colon. Spine may need eval.  Electronic Signatures: Vevelyn Pat H (NP)  (Signed 06-Feb-15 19:31)  Authored: Brief Consult Note   Last Updated: 06-Feb-15 19:31 by Keturah Barre (NP)

## 2015-01-30 NOTE — Consult Note (Signed)
Chief Complaint:  Subjective/Chief Complaint patient seen for abdominalpain, diarrhea, nausea and vomiting, possible crohns.  pain today 6/10 without pain meds.   normal bm c diff neg.  mild nausea no emesis.   VITAL SIGNS/ANCILLARY NOTES: **Vital Signs.:   08-Feb-15 04:40  Vital Signs Type Routine  Temperature Temperature (F) 97.9  Celsius 36.6  Temperature Source oral  Pulse Pulse 115  Respirations Respirations 18  Systolic BP Systolic BP 145  Diastolic BP (mmHg) Diastolic BP (mmHg) 101  Mean BP 115  Pulse Ox % Pulse Ox % 98  Pulse Ox Activity Level  At rest  Oxygen Delivery Room Air/ 21 %   Brief Assessment:  Cardiac Regular   Respiratory clear BS   Gastrointestinal details normal Soft  Nondistended  Bowel sounds normal  No rebound tenderness  mild tenderness rlq   Lab Results: Routine Micro:  07-Feb-15 21:35   Micro Text Report CLOSTRIDIUM DIFFICILE   C.DIFFICILE ANTIGEN       C.DIFFICILE GDH ANTIGEN : NEGATIVE   C.DIFFICILE TOXIN A/B     C.DIFFICILE TOXINS A AND B : NEGATIVE   INTERPRETATION            Negative for C. difficile.    ANTIBIOTIC                         Assessment/Plan:  Assessment/Plan:  Assessment 1) abdominal pain, diarrhea, n/v-improving.  currently on abx without steroids.   Plan 1) sbs, gastric emptying study tomorrow as feasible. 2) awaiting serology diagnostics.  following.   Electronic Signatures: Barnetta Chapel (MD)  (Signed 08-Feb-15 14:48)  Authored: Chief Complaint, VITAL SIGNS/ANCILLARY NOTES, Brief Assessment, Lab Results, Assessment/Plan   Last Updated: 08-Feb-15 14:48 by Barnetta Chapel (MD)

## 2015-01-30 NOTE — Discharge Summary (Signed)
PATIENT NAME:  Roberto [name], Roberto [name] MR#:  [id number] DATE OF BIRTH:  November 28, 1975  DATE OF ADMISSION:  11/14/2013 DATE OF DISCHARGE:  11/18/2013  PRESENTING COMPLAINT: Abdominal pain.   DISCHARGE DIAGNOSES: 1.  Recurrent nausea, vomiting, suspect gastroparesis.  2.  Type 2 diabetes.  3.  Hypertension, new.  4.  Chronic neuropathy, bilateral lower extremity.  5.  Chronic abdominal pain.   PROCEDURES: Small bowel follow-through, normal.   CODE STATUS: FULL CODE.   MEDICATIONS: 1.  Humulin R 3 times a day as per sliding scale.  2.  Asacol 800 mg b.i.d.  3.  Levemir 26 units daily.  4.  AndroGel 2 pumps transdermal once a day as before.  5.  Aleve as needed p.r.n.  6.  Tylenol 500 mg 1 tablet every 8 hours.  7.  Hyoscyamine 0.25 sublingual 3 times a day as needed.  8.  Gabapentin 300 mg 1 capsule 3 times a day.  9.  Lisinopril 10 mg daily.  10.  Hydralazine 50 mg 4 times a day.  11.  Acetaminophen/hydrocodone 5/325mg  1 tab q.6 hours.   Follow up at [facility].   Follow up with GI, Dr. Marva Panda in 1 to 2 weeks.   Follow up GI as outpatient for gastric emptying study.   GI consultation with Dr. Marva Panda.   UA negative for UTI. Basic metabolic panel within normal limits, except glucose 447 and CBC within normal limits.   BRIEF SUMMARY OF HOSPITAL COURSE:  [name] is a 39 year old African American gentleman with many admissions to our service with history of diabetes, medical noncompliance, history of previous diabetic ketoacidosis, history of presumed Crohn disease and chronic abdominal pain, comes to the hospital with:  1. Abdominal pain, nausea, vomiting and diarrhea. Exact etiology was unclear. He has had recent hospitalization with extensive workup including CT of the abdomen and pelvis, along with colonoscopy, which did not show any acute pathology. He was seen by Dr. Marva Panda, who recommended to get small bowel follow-through, which was essentially negative. The patient  will follow up with GI as an outpatient for gastric emptying study for presumed gastroparesis. He was able to tolerate p.o. diet prior to discharge.  2.  Type 2 diabetes. Resume his Levemir and sliding scale.  3.  Diabetic neuropathy. Continue Neurontin.  4.  New-onset hypertension. The patient was started on hydralazine and lisinopril.   Hospital stay otherwise remained stable. The patient remained a FULL CODE.   TIME SPENT: 40 minutes.    ____________________________ Wylie Hail Allena Katz, MD sap:dmm D: 11/27/2013 16:52:05 ET T: 11/27/2013 22:50:33 ET JOB#: [id number]  cc: Tari Lecount A. Allena Katz, MD, <Dictator> [facility] Christena Deem, MD Willow Ora MD ELECTRONICALLY SIGNED 12/07/2013 13:36

## 2015-01-30 NOTE — H&P (Signed)
PATIENT NAME:  Roberto [name], Roberto [name] MR#:  [id number] DATE OF BIRTH:  1976-05-18  DATE OF ADMISSION:  10/27/2013  PRIMARY CARE PHYSICIAN:  Follows at [facility].   REFERRING PHYSICIAN: Dr. Margarita Grizzle.   CHIEF COMPLAINT: Nausea, vomiting, abdominal pain and diarrhea.   HISTORY OF PRESENT ILLNESS: The patient is a 39 year old African American male with numerous admissions for DKA with history of Crohn's noncompliance in the past, who lives in shelter. The patient stated that he has been having abdominal pain for the past month or so bilateral upper. In the last several days he has been having nausea and vomiting, p.o. intolerance and diarrhea. He has had multiple episodes of diarrhea which is progressing and increasing and now is having diarrhea every 15 to 20 minutes. He states that most of the time there is some blood in the stool. He cannot keep anything down, and eating exacerbates his pain. He has had no fevers, but has chills. He states that he ran out of his Asacol  three days ago. He came into the hospital and underwent CT of the abdomen and pelvis with contrast which did not show any acute bowel obstruction or focal bowel abnormalities. However, he was tachycardic, tachypneic when admitted and he has received multiple doses of Zofran, IV opiates as well as 3 liters of fluids. Heart rate has come down. C. difficile was sent, which is negative. Hospitalist services were contacted for further evaluation and management.   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   ALLERGIES: No known drug allergies.   OUTPATIENT MEDICATIONS: He states he is on Asacol 800 mg 2 times a day, sliding scale insulin and Levemir about 26 units daily.   SOCIAL HISTORY: Smokes 1/2 pack a day, occasional marijuana, no alcohol, lives in shelter.    FAMILY HISTORY: Diabetes.   REVIEW OF SYSTEMS:  CONSTITUTIONAL: No fever, feels chills and has had some weight loss.  EYES: Blurry vision if his sugars are high.  ENT: No tinnitus or hearing loss. Sore throat when he lies flat and some retrosternal burning.  CARDIOVASCULAR: No chest pain or palpitations.  RESPIRATORY: No shortness of breath, cough or sputum production.  GASTROINTESTINAL: Positive for nausea, vomiting, abdominal pain as above. No melena, but has had blood in the stool most of the time that he has a bowel movement. The diarrhea was watery.  GENITOURINARY: Denies dysuria or hematuria.  MUSCULOSKELETAL: Has chronic joint pains.  SKIN:  No rashes.  NEUROLOGICAL: Numbness in his legs bilaterally.  PSYCHIATRIC: No anxiety, but has depression.  ENDOCRINE: No thyroid problems.  HEMATOLOGIC AND LYMPHATIC: No anemia or easy bruising.   PHYSICAL EXAMINATION: VITAL SIGNS: Temperature on arrival not done, but we just took a temperature; it was 98.6, pulse rate 115 on admission, respiratory rate 28, blood pressure 141/61 on admission, oxygen saturation 97% on room air.  GENERAL: The patient is a well-developed male lying in bed, flat affect.  Talking in full sentences.  HEENT: Normocephalic, atraumatic. Pupils are equal and reactive. Anicteric sclerae. Extraocular muscles intact. Dry mucous membranes.  NECK: Supple. No thyroid tenderness. No cervical lymphadenopathy.  CARDIOVASCULAR: S1, S2 irregularly irregular. No murmurs, rubs or gallops.  LUNGS: Clear to auscultation without wheezing, rhonchi or rales.  ABDOMEN: Soft, did not appreciate significant tenderness. No rebound or guarding, hyperactive.  EXTREMITIES: No pitting edema.  NEUROLOGIC: Cranial nerves  II through XII grossly intact. Strength is 5/5 in all extremities. Sensation intact to light touch except lower extremities in the mid shin and below and decreased subjective sensation.  PSYCHIATRIC: Awake, alert, oriented, flat  affect and cooperative.   LABORATORY, DIAGNOSTIC AND RADIOLOGIC DATA:  CT of the abdomen and pelvis: No acute abnormalities.  Magnesium is 1.6, BUN 7, creatinine 0.56,  glucose 315. LFTs on admission were with within normal limits. Troponin negative. White count of 8.3, hemoglobin 13.4, platelets 149. C. difficile was negative. Urinalysis did not suggest an infection.    EKG: Sinus tach 104, no acute ST elevations or depressions.   ASSESSMENT AND PLAN: We have a 39 year old male with history of noncompliance with medications with multiple admissions for diabetic ketoacidosis, uncontrolled diabetes, Crohn's disease, who comes in with subacute but progressive abdominal pain, nausea, vomiting, diarrhea, he cannot keep oral. The diarrhea also has some blood per patient. The patient came in with systemic inflammatory response syndrome criteria, with tachycardia and tachypnea. It is possible that this is acute Crohn's, flare but there is no uptake from the contrast enhanced CT of the abdomen and pelvis. Clostridium difficile has been ruled out. The patient clinically is dehydrated and has received 3 liters of fluid. We would obtain a gastroenterology consult, start the patient on Cipro, Flagyl as well as IV steroids secondary to oral intolerance and follow with gastroenterology's recommendations. We sent comprehensive stool cultures as well as ova, parasites, and stool guaiac. In regards to his diabetes, he will initially made nothing oral except medications. I would cut down on the long-acting insulin and start the patient on sliding scale insulin. I would start him on as-needed Zofran and morphine for symptomatic relief as well. He does smoke tobacco and he was counseled for three minutes. He does want a patch at this point. He is motivated to quit. He has hypomagnesemia, and that will be repleted.   TOTAL TIME SPENT: 40 minutes.   CODE STATUS: The patient is a full code     ____________________________ Krystal Eaton, MD sa:cc D: 10/27/2013 18:03:08 ET T: 10/27/2013 18:17:06 ET JOB#: [id number]  cc: Krystal Eaton, MD, <Dictator> Krystal Eaton MD ELECTRONICALLY SIGNED 11/22/2013 10:51

## 2015-01-30 NOTE — Consult Note (Signed)
Chief Complaint:  Subjective/Chief Complaint seen for n/v and diarrhea abdominalpain.  no emesis or bm since admission, mild nausea.  still with rlq pain.  main complaint is neuropathy in his feet with DM.   VITAL SIGNS/ANCILLARY NOTES: **Vital Signs.:   07-Feb-15 05:00  Vital Signs Type Routine  Temperature Temperature (F) 98.2  Celsius 36.7  Temperature Source oral  Pulse Pulse 95  Respirations Respirations 17  Systolic BP Systolic BP 155  Diastolic BP (mmHg) Diastolic BP (mmHg) 107  Mean BP 123  BP Source  if not from Vital Sign Device RN notified  Pulse Ox % Pulse Ox % 97  Pulse Ox Activity Level  At rest  Oxygen Delivery Room Air/ 21 %   Brief Assessment:  Cardiac Regular   Respiratory clear BS   Gastrointestinal details normal Soft  Nondistended  Bowel sounds normal  No rebound tenderness  tender to palpation in the rlq   Lab Results: General Ref:  07-Feb-15 05:49   IBD sgi Diagnostic ========== TEST NAME ==========  ========= RESULTS =========  = REFERENCE RANGE =  IBD sgi DIAGNOSTIC,PROM.   Routine Chem:  07-Feb-15 05:49   Result Comment IBD SGI DIAGNOSTIC - MUST BE SENT BY [facility] M-F. SPECIMEN  - REORDERED FOR COLLECTION MONDAY AM.  - SPOKE WITH RN TAMARA. FEA  Result(s) reported on 15 Nov 2013 at 10:47AM.  Glucose, Serum  164  BUN  4  Creatinine (comp)  0.55  Sodium, Serum 138  Potassium, Serum  3.4  Chloride, Serum 104  CO2, Serum 28  Calcium (Total), Serum 8.9  Anion Gap  6  Osmolality (calc) 276  eGFR (African American) >60  eGFR (Non-African American) >60 (eGFR values <60mL/min/1.73 m2 may be an indication of chronic kidney disease (CKD). Calculated eGFR is useful in patients with stable renal function. The eGFR calculation will not be reliable in acutely ill patients when serum creatinine is changing rapidly. It is not useful in  patients on dialysis. The eGFR calculation may not be applicable to patients at the low and high extremes of body  sizes, pregnant women, and vegetarians.)  Routine Hem:  07-Feb-15 05:49   WBC (CBC) 7.4  RBC (CBC) 4.41  Hemoglobin (CBC)  12.1  Hematocrit (CBC)  34.8  Platelet Count (CBC)  122  MCV  79  MCH 27.4  MCHC 34.7  RDW 13.0  Neutrophil % 71.6  Lymphocyte % 16.1  Monocyte % 8.9  Eosinophil % 2.3  Basophil % 1.1  Neutrophil # 5.3  Lymphocyte # 1.2  Monocyte # 0.7  Eosinophil # 0.2  Basophil # 0.1 (Result(s) reported on 15 Nov 2013 at 06:27AM.)   Assessment/Plan:  Assessment/Plan:  Assessment 1) history of ileitis, possible crohns disease.  patietn admitted with siilar sx and has colonoscopy on 10/29/13, showing normal proceedure. patietn with at times poorly controled IDDM.  DDX would include gastroparesis, small bowel crohns and diabetic neuroenteropathy.  serology panel ordered.  GAstric emptying study and sbs ordered, but cannot be done until monday (discussed with radiology).   Plan as above, of note no diarrhea since admission.   Electronic Signatures: ,  (MD)  (Signed 07-Feb-15 11:10)  Authored: Chief Complaint, VITAL SIGNS/ANCILLARY NOTES, Brief Assessment, Lab Results, Assessment/Plan   Last Updated: 07-Feb-15 11:10 by ,  (MD) 

## 2015-01-31 NOTE — Consult Note (Signed)
Chief Complaint and History:   Referring Physician Dr. Imogene Burn    Chief Complaint uncontrolled diabetes   Allergies:  No Known Allergies:   Assessment/Plan:   Assessment/Plan Patient was seen and examined and chart was reviewed. This is a 39 year old male with uncontrolled diabetes admitted yesterday with DKA. He is now on Lantus 20 units SQ bid, Glulisine 8 units TID AC and Glulisine SSI. His out-pt regimen is Novolin 70/30 Mix 45 units bid. He claims he has been compliant with insulin. He has not checked his blood sugar since his last hospitalization in 01/2012. He does not have a glucometer. His blood sugars remain high, in the 300-480 range today.  Problems: 1. DKA, resolved 2. Chronically uncontrolled diabetes  Plan: 1. DC Lantus and glulisine as he cannot afford to continue these insulins after hospital discharge. 2. Start Novolin 70/30 Mix 50 units sq BID AC. 3. Change SSI to NovoLog 2 units per 50 >150 qACHS.  I will follow with you. A full consult has been dictated.    Case Discussed With patient   Electronic Signatures: Raj Janus (MD)  (Signed 15-May-13 13:40)  Authored: Chief Complaint and History, ALLERGIES, Assessment/Plan   Last Updated: 15-May-13 13:40 by Raj Janus (MD)

## 2015-01-31 NOTE — Discharge Summary (Signed)
PATIENT NAME:  Roberto [name], Roberto [name] MR#:  [id number] DATE OF BIRTH:  1975-11-19  DATE OF ADMISSION:  12/03/2011 DATE OF DISCHARGE:  12/04/2011  PRESENTING COMPLAINT: Uncontrolled sugars and dehydration.   DISCHARGE DIAGNOSES:  1. Hyperosmolar nonketotic state, improved.  2. Dehydration, resolved.  3. Uncontrolled type II diabetes, appears to be due to dietary noncompliance.  4. History of depression.   CONDITION ON DISCHARGE: Fair. Vitals stable.   MEDICATIONS:  1. Lantus 40 units b.i.d.  2. Metformin 500 p.o. b.i.d.  3. Resume his sliding scale as before.   FOLLOW-UP: Follow-up with [facility] on February 26th at 9 a.m.   LABORATORY, DIAGNOSTIC, AND RADIOLOGICAL DATA: Glucose 137, BUN 10, creatinine 0.7, sodium 143, potassium 3.7, chloride 104, SGOT 152. Sugar on admission was 821, creatinine 1.28, sodium 133.   BRIEF SUMMARY OF HOSPITAL COURSE: [name] is a 39 year old African American gentleman with history of diabetes who presented to the Emergency Room with:  1. Hyperosmolar nonketotic hyperglycemic state. The patient was started initially on sub-Q insulin by ER physician and admitting physician. However, I did switch the patient to insulin drip which brought his sugars well under control, changed to Lantus 40 units b.i.d. since the patient reports his sugars have been running in the 300's and 400's at the shelter home. He also reports taking insulin and p.o. metformin, however, he does not have any control on what he eats at the shelter home and has dietary noncompliance due to that. No recent acute illness. The patient was given insulin from the hospital and was asked to keep a log of it and follow-up with [facility].  2. Dehydration due to hyperosmolar nonketotic state, resolved with IV fluids.  3. History of smoking. Counseled on smoking cessation.   Hospital stay remained stable.   CODE STATUS: The patient remained a FULL CODE.   TIME SPENT: 40 minutes.    ____________________________ Wylie Hail Allena Katz, MD sap:drc D: 12/04/2011 14:34:21 ET T: 12/04/2011 16:20:29 ET JOB#: [id number]  cc: Carliss Porcaro A. Allena Katz, MD, <Dictator> [facility] Willow Ora MD ELECTRONICALLY SIGNED 12/13/2011 6:23

## 2015-01-31 NOTE — H&P (Signed)
PATIENT NAME:  Roberto [name], TOZER MR#:  [id number] DATE OF BIRTH:  12-25-1975  DATE OF ADMISSION:  01/23/2012  PRIMARY CARE PHYSICIAN: [facility].  HISTORY OF PRESENT ILLNESS: The patient is a 39 year old African American male with past medical history significant for history of diabetes mellitus, insulin-dependent, who presented to hospital with complaints of high blood glucose levels. Unfortunately, he is not able to provide much history. However, apparently he has been having high blood glucose levels for a while now. He admits of using Lantus, however, his blood glucose levels are 400s and above. He admits to increased thirst as well as increased urine output and feeling dehydrated as well as somnolent, and he presented to the hospital for further evaluation. In the emergency room, he was noted to have blood glucose levels above 1000 and hospitalist services were contacted for admission.   PAST MEDICAL HISTORY:  1. Diabetes mellitus, insulin-dependent.  2. Crohn's disease. 3. Chronic back pain. 4. Status post right foot surgery. 5. Sickle cell trait. 6. Chronic anemia.   MEDICATIONS: (He was discharged just recently in February 2013 on the following medications. 1. Lantus 40 units daily. 2. Metformin 500 mg p.o. twice a day. 3. Sliding-scale insulin.  4. In the past he was Celexa 20 mg p.o. daily.   ALLERGIES: No known drug allergies.   SOCIAL HISTORY: The patient denies any alcohol abuse. However, he admits of smoking approximately half pack per day for an uncertain period of time. He is not able to provide much more history due to he is somnolent.  FAMILY HISTORY: Diabetes as well as hypertension.   REVIEW OF SYSTEMS: Positive for fatigue and weakness, some blurring of vision, abdominal as well as back pain which seems to be chronic, cough with yellow phlegm production, some wheezing in his lungs. No blood. He has been having some shortness of breath. Also intermittent  palpitations in the chest as well as feeling presyncopal, intermittent nausea and vomiting, abdominal discomfort in the epigastric area as well as increased frequency of urination, increased thirst and polydipsia and polyuria. CONSTITUTIONAL: He denies any fevers, pain, weight loss or gain. EYES: Denies double vision, glaucoma, or cataracts. ENT: Denies tinnitus, allergies, epistaxis, sinus pain, dentures, or difficulty swallowing. RESPIRATORY: Denies any hemoptysis, asthma, or COPD. CARDIOVASCULAR: Denies chest pain, orthopnea, edema, or arrhythmias. GASTROINTESTINAL: Denies any diarrhea, hematemesis, rectal bleeding, or change in bowel habits. GENITOURINARY: Denies dysuria, hematuria, or incontinence. ENDOCRINOLOGY: Denies any polydipsia, nocturia, thyroid problems, heat or cold intolerance or thirst. HEMATOLOGIC: Denies anemia, easy bruising, bleeding, or swollen  glands. SKIN: Denies any acne, rash, lesions, or change in moles. MUSCULOSKELETAL: Denies arthritis, cramps, swelling, or gout. NEUROLOGIC: No numbness, epilepsy, or tremor. PSYCH: Denies anxiety, insomnia, or depression.  PHYSICAL EXAMINATION:  VITALS: On arrival to the hospital, the patient's temperature was 97.5, pulse 107, respiration rate 20, blood pressure 136/78, and saturation 97% on room air.   GENERAL: This is a well-developed, well-nourished mildly obese [place] American male, very somnolent. I am having difficulty getting him up, waking him up.   HEENT: Pupils are equally round and reactive to light. Extraocular movements are intact. No icterus or conjunctivitis. He has normal hearing. No pharyngeal erythema. Mucosa is dry.   NECK: No mass, supple and nontender. Thyroid is not enlarged. No adenopathy. No JVD or carotid bruits bilaterally. Full range of motion.   LUNGS: Clear to auscultation anteriorly. No rales, rhonchi, diminished breath sounds, wheezing, labored inspirations, increased effort, dullness to percussion, or overt  respiratory distress.   HEART: S1 and S2 appreciated. No murmurs, gallops, or rubs are noted. PMI is not lateralized. Chest is nontender to palpation.   EXTREMITIES: 1+ pedal pulses. No lower extremity. No clubbing or cyanosis.  ABDOMEN: Soft and tender in the epigastric area, but no rebound or guarding were noted. No hepatosplenomegaly or masses were noted.   RECTAL: Deferred.   MUSCULOSKELETAL: He is able to move all extremities. No cyanosis, degenerative joint disease, or kyphosis. Gait was not tested.   SKIN: Skin did not reveal any rashes, lesions, erythema, nodularity, or induration. It was warm and dry to palpation.   LYMPH: No adenopathy in the cervical region.   NEUROLOGICAL: Cranial nerves II through XII grossly intact. Sensory is intact. No dysarthria or aphasia.   PSYCHIATRIC: The patient is somnolent, oriented to time, person, and place. Poorly cooperative. Memory is good. No significant confusion, agitation, or depression noted.   LABS/STUDIES: BMP showed glucose of 1047. BUN and creatinine were 23 and 1.33, sodium 122, otherwise BMP is unremarkable. Liver enzymes are normal. Cardiac enzymes are normal. White blood cell count 9.4, hemoglobin 13.5, and platelet count 145.   Urinalysis: Colorless clear urine, more than 500 glucose, negative for bilirubin, trace ketones, specific gravity 1.023, pH 6.0, negative for blood, protein, nitrites or leukocyte esterase, less than 1 red blood cell, no white blood cells, no bacteria or epithelial cells. Mucous was present.  EKG showed normal sinus rhythm at 85 beats per minute, normal axis, no acute ST-T changes were noted.   Chest x-ray has not been performed yet.  ASSESSMENT AND PLAN:  1. Hyperglycemia as well as osmolar state: Admit the patient to the medical floor. Continue him on insulin drip. Follow his glucose levels very closely, every one hour. Restart the patient on Lantus and advance Lantus as necessary. Continue sliding  scale insulin. Get Hemoglobin A1c.  2. Acute renal failure: Continue the patient on IV fluids. Reassess creatinine tomorrow.  3. Hyponatremia, likely dehydration related: Continue the patient on IV fluids as above.  4. Thrombocytopenia: Get pro time and INR as well as PTT. Follow the patient's platelet count in the morning.  5. History of depression: Restart the patient on Celexa.   TIME SPENT: One hour.  ____________________________ Katharina Caper, MD rv:slb D: 01/23/2012 17:08:19 ET T: 01/23/2012 17:39:16 ET JOB#: [id number]  cc: Katharina Caper, MD, <Dictator> [facility] Zoya Sprecher MD ELECTRONICALLY SIGNED 01/30/2012 20:06

## 2015-01-31 NOTE — Discharge Summary (Signed)
PATIENT NAME:  Roberto [name], Roberto [name] MR#:  [id number] DATE OF BIRTH:  20-Feb-1976  DATE OF ADMISSION:  01/23/2012 DATE OF DISCHARGE:  01/25/2012  PRESENTING COMPLAINT: Uncontrolled sugars.   DISCHARGE DIAGNOSES:  1. Hyperosmolar nonketotic state.  2. Uncontrolled type II diabetes secondary to medication noncompliance.  3. Depression.  4. Chronic low back pain.  5. Hyponatremia secondary to hyperglycemia.  6. Hypokalemia, resolved.   CONDITION ON DISCHARGE: Fair.   MEDICATIONS:  1. Metformin 500 mg b.i.d.  2. Insulin 70/30 45 units b.i.d.  3. Aspirin 81 mg daily.  4. Celexa 20 mg p.o. daily.  FOLLOW-UP: Follow-up with [facility] in two weeks.   DIET: ADA 1800 calorie diet.   LABS AT DISCHARGE: Glucose 87. Basic metabolic panel within normal limits. CBC within normal limits except hemoglobin and hematocrit of 12.1 and 36.0, platelet count of 122, MCV of 76. LFTs within normal limits.   CONSULTATION: Endocrine consultation with Dr. Tedd Sias    BRIEF SUMMARY OF HOSPITAL COURSE: Roberto [name] is a 39 year old African American gentleman well known to our service from previous admissions who came in with:  1. Uncontrolled type II diabetes with hyperosmolar nonketotic state. His hemoglobin A1c is more than 15. His blood glucose was more than 1000 at admission. He was started on insulin drip, IV fluids. The patient has been compliant with his insulin, however, ran out of his metformin. He did fairly well with insulin 70/30 and he was discharged on 45 units b.i.d. along with metformin 500 b.i.d. Dr. Tedd Sias saw the patient while in-house. The patient was advised to continue his insulin and p.o. metformin and follow-up with [facility] on a regular basis to ensure he has enough medication before it runs out.  2. Depression. Resumed Celexa. The patient follows with Dr. Janeece Riggers at [facility].  3. Chronic low back pain.  4. Renal insufficiency likely due to dehydration from elevated sugars.   5. Hyponatremia, hypokalemia, resolved after sugars were stabilized.   The patient lives in a shelter, however, he is going to be discharged to home and is going to live with his friend for some time.   Hospital stay otherwise remained stable.   CODE STATUS: The patient remained a FULL CODE.   TIME SPENT: 40 minutes.   ____________________________ Wylie Hail Allena Katz, MD sap:drc D: 01/25/2012 15:08:40 ET T: 01/26/2012 11:10:34 ET JOB#: [id number]  cc: Trevia Nop A. Allena Katz, MD, <Dictator> [facility] Willow Ora MD ELECTRONICALLY SIGNED 01/29/2012 13:20

## 2015-01-31 NOTE — Consult Note (Signed)
PATIENT NAME:  Roberto [name], Roberto [name] MR#:  [id number] DATE OF BIRTH:  March 24, 1976  DATE OF CONSULTATION:  04/18/2012  REFERRING PHYSICIAN:  Shaune Pollack, MD    CONSULTING PHYSICIAN:  A. Wendall Mola, MD  CHIEF COMPLAINT: Uncontrolled diabetes.   HISTORY OF PRESENT ILLNESS: The patient is a 39 year old male seen in consultation for uncontrolled diabetes. He presented earlier today with severe hyperglycemia with a blood sugar of 1099; in addition, he had acute renal failure with a creatinine of 1.6. He was not acidotic, and his bicarbonate was 25, and a urinalysis did not reveal ketones, most consistent with severe hyperglycemia nonketotic state. He was admitted and placed on IV insulin as well as IV fluids and has had good improvement in his blood sugars in the last several hours. Blood sugars have been in the range of 135 to 280. The patient is well known to me from prior admissions also for uncontrolled diabetes. Most recently, he was admitted in May and April with diabetic ketoacidosis.  In May, his discharge medications included Novolin 70/30 mix, 50 units subcutaneous b.i.d., and metformin 500 mg b.i.d. Over the last two months, the patient has been living at [facility], a [employer] in [place], [place]. He claims to have seen an endocrinologist at the [facility] at one point in the last two months and had his dose of insulin increased to 75 units b.i.d. He reports compliance with the insulin at this dose as well as with the metformin. He claims to be checking his blood sugars three times daily and reports they are consistently above 400. He reports increased thirst, polyuria, and blurred vision ongoing for many months. He denies any nausea or vomiting. He has poor appetite. He also reports chronic pains in his back and stomach which he claims have been ongoing for years. He has no new complaints today.   PAST MEDICAL HISTORY:  1. Diabetes mellitus.  2. Crohn's disease.   3. Ankylosing spondylitis.  4. Sickle cell trait.  5. Tobacco dependence.  6. 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   SOCIAL HISTORY: The patient smokes 1/2 pack of cigarettes per day. He is unemployed and homeless, currently living at the [facility] in [place], [place]. He is divorced. He has one child.  FAMILY HISTORY: Positive for diabetes and Crohn's disease.   ALLERGIES: No known drug allergies.   CURRENT MEDICATIONS:  1. Celexa 40 mg daily.  2. Mesalamine 600 mg t.i.d.  3. Protonix 40 mg daily.  4. IV insulin drip.  5. Lovenox 40 mg subcutaneous daily.  6. Normal saline 0.9% at 200 mL/h.   REVIEW OF SYSTEMS: HEENT: He reports blurred vision. He reports dry mouth. NECK: Denies neck pain or dysphagia. CARDIAC: Denies chest pain or palpitations although he does report diffuse discomfort over the trunk which is chronic in nature. PULMONARY: Denies cough or shortness of breath. ABDOMEN: Appetite is poor. No recent change in bowel habits. EXTREMITIES: Denies leg swelling. SKIN: Denies rash or pruritus. ENDOCRINE: Denies heat or cold intolerance. HEMATOLOGIC: No easy bruisability or recent bleeding. NEUROLOGICAL:  He reports tingling and paresthesias in both feet, long-standing. GU: He reports polyuria. He denies dysuria or hematuria.   PHYSICAL EXAMINATION:  VITAL SIGNS: Temperature 98.2, pulse 66 to 82, respirations 12 to 14, blood pressure 128/79, and pulse oxide 98% on room air.   GENERAL: Well-appearing African American male in no acute distress.   HEENT: Extraocular movements are intact. No scleral icterus. Oropharynx is  clear. Mucous membranes appear dry.   NECK: Supple. No thyromegaly.   CARDIAC: Regular rate and rhythm. No carotid bruit.   PULMONARY: Clear bilaterally. No wheeze or rhonchi.   ABDOMEN: Diffusely soft, nontender, nondistended.   EXTREMITIES: No edema is present.   NEUROLOGICAL: Motor strength intact throughout. Some  decreased subjective numbness on the feet is present.   LABORATORY DATA: At 6:49 a.m.: Glucose 197, BUN 14, creatinine 0.68, sodium 141, potassium 3.3, chloride 104, CO2 29, calcium 8.9.   ASSESSMENT: This is a 39 year old male admitted with hyperglycemic nonketotic state, blood sugars much improved after being given IV insulin.   RECOMMENDATIONS:  Uncontrolled diabetes: We will transition to subcutaneous insulin. Recommend initiating his outpatient dose of Novolin 70/30 75 units b.i.d.  in addition to metformin 500 mg b.i.d. Additionally, we will add a NovoLog sliding scale with NovoLog 4 units per sugar of 50 over a target of 150. I agree with a low carb diet. I will adjust the metformin and/or insulin as needed.   Thank you for the kind request for consultation. I will follow along with you.  ____________________________ A. Wendall Mola, MD ams:cbb D: 04/18/2012 16:28:25 ET T: 04/18/2012 17:47:49 ET JOB#: [id number]  cc: A. Wendall Mola, MD, <Dictator> Macy Mis MD ELECTRONICALLY SIGNED 04/23/2012 14:27

## 2015-01-31 NOTE — Consult Note (Signed)
PATIENT NAME:  Roberto [name], Roberto [name] MR#:  [id number] DATE OF BIRTH:  1975/11/23  DATE OF CONSULTATION:  02/20/2012  REFERRING PHYSICIAN:  Alounthith Phichith, MD CONSULTING PHYSICIAN:  Nyaire Denbleyker R. Sherrlyn Hock, MD  REASON FOR CONSULTATION: Thrombocytopenia.   HISTORY OF PRESENT ILLNESS: The patient is a 39 year old African American gentleman with past medical history significant for diabetes mellitus, depression, Crohn's disease, chronic pain, polysubstance abuse with tobacco and marijuana, history of noncompliance, and multiple hospitalizations for hyperglycemic nonketotic states and noncompliance with medications, who has been admitted to the hospital earlier today for complaints of fatigue, nausea, vomiting and body aches. The patient is feeling very weak and he is currently in the Critical Care Unit. The patient also has history of sickle cell trait with chronic anemia and thrombocytopenia, depression with history of suicide attempts x2, gastroesophageal reflux disease, peripheral neuropathy brought, sinus MRSA infection in 2008, right foot surgery and bilateral orchiectomy status post trauma. Platelet count done earlier was low at 115, hemoglobin normal at 13.8, and WBC 10,000. Review of previous CBCs shows that platelet count has consistently been slightly below normal range at least for the last 2-1/2 years or so, mostly in the range of 100 to 133,000. The patient currently denies any obvious bleeding symptoms including epistaxis, gum bleeding, hemoptysis, hematemesis, bright red blood in stools, melena, or hematuria. He does have easy skin bruising on minor trauma or pressure.   PAST MEDICAL/SURGICAL HISTORY: As in history of present illness above.   FAMILY HISTORY: Remarkable for diabetes, Crohn's disease, hypertension, and grandmother had lung cancer. Denies hematological disorders.   SOCIAL HISTORY: Chronic smoker of 1/2 pack per day, occasional marijuana usage, denies IV drug usage, and denies alcohol  intake.   HOME MEDICATIONS:  1. Metformin 500 mg twice a day. 2. Insulin 70/30 45 units twice a day. 3. Celexa 20 mg daily.   ALLERGIES: No known drug allergies.   REVIEW OF SYSTEMS: CONSTITUTIONAL: Feels very weak and tired, currently slightly better since admission. No fevers, chills, or night sweats. HEENT: Denies any headaches or dizziness at rest. No epistaxis, ear or jaw pain. CARDIAC: No chest pain, orthopnea, or paroxysmal nocturnal dyspnea. LUNGS: No new cough, dyspnea, sputum, hemoptysis, or chest pain. GASTROINTESTINAL: Having nausea and vomiting. No diarrhea or blood in stools. Chronic upper abdominal pain which is unchanged. GENITOURINARY: No dysuria or hematuria. MUSCULOSKELETAL: Chronic aching all over, denies any new changes. SKIN: No new rashes. HEMATOLOGIC: As in history of present illness. NEUROLOGIC: Denies any recent focal weakness, seizures, or loss of consciousness. ENDOCRINE: No polyuria or polydipsia. Appetite is fairly steady, decreased for the last few days.   PHYSICAL EXAMINATION:   GENERAL: The patient is weak and tired-looking, slightly lethargic but otherwise alert and oriented to self, place, person, and time and converses appropriately. No icterus. No acute distress. No pallor.   VITAL SIGNS: 98.2, 70, 12, 117/77, and 100% on room air.   HEENT: Normocephalic, atraumatic. Extraocular movements intact. Sclera anicteric. No oral thrush or petechiae.   NECK: Supple. No lymphadenopathy.  HEART: S1 and S2, regular rate and rhythm.   LUNGS: Bilateral good air entry, decreased at bases, no rhonchi.   ABDOMEN: Soft. No hepatosplenomegaly clinically. Tenderness present in epigastrium but no guarding or rigidity.   EXTREMITIES: No major edema or bruising.   SKIN: No generalized rashes.  NEURO: Limited examination. Cranial nerves are intact. Moves all extremities spontaneously.   MUSCULOSKELETAL: No obvious joint deformities or swelling.   LAB RESULTS: As in  history of  present illness above.   IMPRESSION AND RECOMMENDATIONS: This is a 39 year old gentleman with history of multiple medical problems currently admitted with poorly controlled diabetic ketoacidosis and generalized weakness, found to have mild thrombocytopenia which seems to be a chronic finding, at least for the last two years or so. Clinically does not have major bleeding symptoms. Platelet count consistently seems to remain greater than 100,000, no significant anemia or leukopenia otherwise. Liver functions are also within normal range with albumin normal at 3.5 grams. No lymphadenopathy or hepatosplenomegaly on exam. Possible etiology for thrombocytopenia is ITP (idiopathic thrombocytopenic purpura) versus other etiology and he needs further work-up. We will get manual differential with CBC, direct platelet antibody test to look for ITP, B12 and folate level, iron and TIBC, LDH and haptoglobin, PT/PTT/fibrinogen/FDP to look for DIC or coagulopathy, HBsAg, HCV antibody, and HIV antibody. The patient had recent CT scan in January 2013 which reported liver and spleen was unremarkable. We will follow-up once above workup results are available and make further recommendation. The patient explained above, agreeable to this plan.   Thank you for the referral. Please feel free to contact me if additional questions. ____________________________ Maren Reamer Sherrlyn Hock, MD srp:slb D: 02/21/2012 10:11:00 ET T: 02/21/2012 10:37:03 ET JOB#: [id number]  cc: Josuha Fontanez R. Sherrlyn Hock, MD, <Dictator> Wille Celeste MD ELECTRONICALLY SIGNED 02/22/2012 13:33

## 2015-01-31 NOTE — Consult Note (Signed)
Allergies:  No Known Allergies:   Assessment/Plan:   Assessment/Plan 39 yo M with uncontrolled type 2 diabetes known to me frm prior admissions, admitted today with severe hyperglycemia, glc >1000, without ketosis. Patient claims that his out-pt diabetes regimen is metformin 500 mg bid and NovoLog 70/30 Mix 75 unit bid. Sugars have been high, consistently over 400 prior to admission. He has had symptoms o hyperglycemia with increased thirst, polyuria, and blurred vision and weakness. No weight loss. Was admitted and placed on IV insulin and IV fluids. Sugars have been improving. Creatinine is normal.  A/ Severe hyperglycemia due to uncontrolled type 2 diabetes  P/ 1. Will start his out-pt regimen of insulin 70/30 Mix 75 units bid and metformin 500 mg bid and follow along and adjust these doses, as needed. 2. Will start a Novolog sliding scale of 4 per 50 over a target of 150 qachs. 3. Will DC IV inslin 4. Will continue IVF at same rate.   Electronic Signatures: Raj Janus (MD)  (Signed 11-Jul-13 15:32)  Willis ModenaForrestine Him, Assessment/Plan   Last Updated: 11-Jul-13 15:32 by Raj Janus (MD)

## 2015-01-31 NOTE — Consult Note (Signed)
PATIENT NAME:  Roberto [name], Roberto [name] MR#:  [id number] DATE OF BIRTH:  1976/01/22  DATE OF CONSULTATION:  02/21/2012  REFERRING PHYSICIAN:  Shaune Pollack, MD  CONSULTING PHYSICIAN:  A. Wendall Mola, MD  CHIEF COMPLAINT: Uncontrolled diabetes.   HISTORY OF PRESENT ILLNESS: The patient is a 39 year old male with long-standing uncontrolled diabetes admitted yesterday with diabetic ketoacidosis. He has had multiple prior admissions for DKA and/or HHNK and, in fact, was admitted here for the same last month. On discharge last month, he was sent out on a regimen of Novolin 70/30 mix 35 units b.i.d. This dose was effective in controlling sugars during that hospitalization. He has not checked his blood sugar this past month. He claims he increased the 70/30 dose to 40 units b.i.d., and he has been compliant with this dose. He presented yesterday with a blood sugar of 884, bicarbonate 19, anion gap 17, and 2+ urinary ketones consistent with diabetic ketoacidosis. He was treated briefly with an insulin drip and then transitioned to subcutaneous insulin. He is now on Lantus 20 units b.i.d., glulisine 8 units t.i.d. before meals, and a glulisine sliding scale. Blood sugars remain high in the 300 to 400 range. He complains of blurred vision. He complains of fatigue. He complains of polyuria and polydipsia.    PAST MEDICAL/SOCIAL HISTORY:  1. Diabetes mellitus.  2. Reported Crohn's disease.  3. Reported ankylosing spondylitis.  4. Reported sickle cell trait.  5. Tobacco dependence.  6. 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   SOCIAL HISTORY: The patient smokes 1/2 package of cigarettes per day. He is unemployed and homeless. He is single. He is divorced. He has one child.   FAMILY HISTORY: Positive for diabetes, Crohn's disease.   ALLERGIES: No known drug allergies.   CURRENT INPATIENT MEDICATIONS:  1. Lantus 20 units b.i.d.  2. Glulisine 8 units t.i.d.  3. Glulisine sliding scale.  4. Docusate  100 mg b.i.d.  5. Nicotine patch 14 mg daily.  6. Pantoprazole 40 mg daily.  7. Aspirin 81 mg daily.   REVIEW OF SYSTEMS: HEENT: He complains of blurred vision. He complains of dry mouth. NECK: Denies neck pain. No dysphasia. CARDIAC: Denies chest pain and palpitations. PULMONARY: Denies cough and shortness of breath. ABDOMEN: Complains of constipation. No diarrhea. Good appetite. EXTREMITIES: Denies leg swelling. SKIN: Denies rash or pruritus. NEUROLOGICAL: He reports tingling paresthesias in both feet, long-standing. GU: He complains of polyuria. He denies dysuria.   PHYSICAL EXAMINATION:  VITAL SIGNS: Temperature 97.9, pulse 81, respirations 18, blood pressure 134/90. Oxygen saturation 99% on room air.   GENERAL: Well-developed, well-nourished African American male in no distress.   HEENT: Extraocular movements are intact. Oropharynx is clear. Mucous membranes are moist.   NECK: Supple. No thyromegaly.   CARDIAC: Regular rate and rhythm without murmur.   PULMONARY: Clear to auscultation bilaterally. No wheeze.   ABDOMEN: Mild distention, nontender, decreased bowel sounds.   EXTREMITIES: No edema is present.   SKIN: A bilateral barefoot exam showed no calluses or lesions. No rash is present.   NEUROLOGICAL: Some numbness on the feet is evident. No neurologic deficits.   PSYCHIATRIC: Alert and oriented x3, calm and cooperative.   LABORATORY DATA: Glucose 383, BUN 12, creatinine 0.8, sodium 135, potassium 3.6, magnesium 1.5, calcium 8.4. Hematocrit 36.7%, platelets 105. Hemoglobin A1c on 01/23/2012 was 15.8%.   ASSESSMENT: A 39 year old male admitted for diabetic ketoacidosis likely due to noncompliance with insulin regimen. Diabetic ketoacidosis has resolved, however, blood sugars remain uncontrolled.  PLAN:  1. We will replace the Lantus and glulisine with a 70/30 mixed insulin as this is what he plans to continue after hospital discharge. He feels the 45 units b.i.d. was not  working, therefore, we will start with 50 units b.i.d.  2. Discontinue glulisine sliding scale and replace with a NovoLog sliding scale of 2 units per 50 over a target of 150.   I will follow along with you.   ____________________________ A. Wendall Mola, MD ams:cbb D: 02/21/2012 16:25:55 ET T: 02/21/2012 16:46:42 ET JOB#: [id number]  cc: A. Wendall Mola, MD, <Dictator> Macy Mis MD ELECTRONICALLY SIGNED 02/26/2012 17:35
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
## 2015-01-31 NOTE — Consult Note (Signed)
HEMATOLOGY followup note - still weak and tired. No bleeding Sxs.no fevers. Has generalised aches and pains.A, O x 3, NAD.          vitals - afebrile, stable          lungs - b/l CTA          abd - soft, no hepatosplenomegalyHb 12.9, WBC 7400, platelets 95K, ANC 3900, Cr 0.81. Workup unremarkable except for low serum iron and TIBC but normal iron saturation. Thrombocytopenia of unclear etiology - ITP versus other etiology. Workup unremarkable for obvious etiology. No bleeding Sxs, no treatment needed at this time. Have d/w patient aboutpursuing bone marrow biopsy to r/o underlying marrow disorder, he is agreeable. Will get procedure done in next few days.   Electronic Signatures: Jonn Shingles (MD)  (Signed on 16-May-13 22:22)  Authored  Last Updated: 16-May-13 22:22 by Jonn Shingles (MD)

## 2015-01-31 NOTE — Consult Note (Signed)
PATIENT NAME:  Roberto [name], Roberto [name] MR#:  [id number] DATE OF BIRTH:  Jan 25, 1976  DATE OF CONSULTATION:  01/24/2012  REFERRING PHYSICIAN:  Fredia Sorrow, MD CONSULTING PHYSICIAN:  A. Wendall Mola, MD  CHIEF COMPLAINT: Uncontrolled diabetes.   HISTORY OF PRESENT ILLNESS: This is a 39 year old male seen in consultation for uncontrolled diabetes. He was admitted yesterday with several days of nausea, lightheadedness, polyuria, polydipsia, and blurred vision. His presenting blood sugar was greater than 1,000. He had a normal bicarbonate of 26. He had a hemoglobin A1c is 15.8% and he had significant glucosuria of greater than 500 without detectable urine ketones. There are no clear precipitators for his severe hyperglycemia although he admits he has not been taking his metformin. In the last week his outpatient medications for diabetes have been just Lantus 40 units twice a day. In the past he was treated with a 70/30 mix insulin, which he found more effective; however, he has not been taking that in some time. He is homeless and does not have medical insurance. He has been following at the [facility] but has not been able to get to his last few appointments due to problems with transportation. He claims to be checking his blood sugars twice daily and reports in the last week they have been in the 400-"high range". Prior to this admission he was living at a shelter called the [facility] where he reports they have very high starch diet. He recognizes that his diet plays a big role in his blood sugars. He is feeling better this morning. He still has nausea and he complains of headache. His appetite is fine. On admission, he was placed on insulin drip, but was transitioned to subcutaneous insulin early this morning and at 9:00 a.m. received Lantus 45 units and then the drip was stopped. In addition, he is receiving NovoLog sliding scale. His 11:30 a.m. blood sugar was 376.   PAST MEDICAL HISTORY:   1. Diabetes mellitus.  2. Reported Crohn's disease.  3. Reported ankylosing spondylitis.  4. Reported sickle cell trait.  5. Tobacco dependence.  6. 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    SOCIAL HISTORY: The patient smokes 1/2 pack of cigarettes per day. He denies use of alcohol. He has been residing at the [facility]. He is single. Divorced. He has one child.   FAMILY HISTORY: Both parents had diabetes. Mother had Crohn's disease. He had an aunt with diabetes and end-stage renal disease who is deceased. A grandmother had diabetes and another grandmother had lung cancer.   ALLERGIES: No known drug allergies.   CURRENT INPATIENT MEDICATIONS:  1. Lantus 45 units subcutaneous b.i.d.  2. Azithromycin 250 mg daily.  3. Celexa 20 mg daily.  4. Nicotrol 21 mg patch.  5. Aspirin 81 mg daily.  6. Heparin 5000 units subcutaneous every 12 hours.  7. Metformin 500 mg b.i.d.   REVIEW OF SYSTEMS: GENERAL: No recent weight loss. No fevers. HEENT: He reports blurred vision. He reports a headache. He denies sore throat. NECK: No neck pain. No dysphasia. CARDIAC: No chest pain or palpitations. PULMONARY: He has a mild cough. He denies dyspnea. ABDOMEN: He reports some abdominal discomfort. He has ongoing nausea without emesis. EXTREMITIES: He denies leg swelling. SKIN: He denies recent skin changes or rash or pruritus. ENDOCRINE: He reports occasional feelings of increased heat, but no specifically heat intolerance or cold intolerance. HEMATOLOGIC: He denies easy bruisability or recent bleeding.   PHYSICAL EXAMINATION:  VITAL SIGNS: Temperature 99.1, pulse 70, respiratory rate 13, blood pressure 100/59, pulse oximetry 97% on room air.   GENERAL: Obese African American male in no acute distress.   HEENT: Extraocular movements are intact. Oropharynx is clear. Mucous membranes moist.   NECK: Supple. No thyromegaly.   CARDIAC: Regular rate and rhythm without murmur.    PULMONARY: Clear to auscultation bilaterally. No cough or wheeze.   ABDOMEN: Diffusely soft, nontender, nondistended. Positive bowel sounds.   EXTREMITIES: No edema is present.   SKIN: No acanthosis nigricans present. No rash is present.   NEUROLOGIC: No focal deficits. No tremor of outstretched hands.   PSYCHIATRIC: Alert and oriented x3.   LABORATORY, RADIOLOGICAL AND DIAGNOSTIC DATA: Sodium 142, glucose 135, BUN 15, creatinine 0.7, chloride 106, CO2 24, calcium 8.0. Hemoglobin A1c 15.8%, AST 18, ALT 20. Troponin I less than 0.02 x3. Hematocrit 36%, WBC 7.9, INR 1.0.   ASSESSMENT: This is a 39 year old male with long-standing uncontrolled diabetes, current A1c of 15.8%, admitted for hyperosmolar, hyperglycemic nonketotic state. Sugars much improved after use of insulin drip.   RECOMMENDATIONS:  1. We will replace his Lantus with a 70/30 mix at 45 units b.i.d. Continue the NovoLog sliding scale.  2. He will need close outpatient follow-up. As he is uninsured, the [facility] would be suitable. However, since he has missed some recent appointments, I am not sure if he will still be eligible. Care management will be able to help [place] with this.  3. Recommended tobacco cessation.   Thank you for the kind request for consultation. I will follow along with you.   ____________________________ A. Wendall Mola, MD ams:ap D: 01/24/2012 13:10:25 ET T: 01/25/2012 08:19:21 ET JOB#: [id number]  cc: A. Wendall Mola, MD, <Dictator> Macy Mis MD ELECTRONICALLY SIGNED 01/26/2012 16:19
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
## 2015-01-31 NOTE — Discharge Summary (Signed)
PATIENT NAME:  Roberto [name], Roberto [name] MR#:  [id number] DATE OF BIRTH:  1976-05-28  DATE OF ADMISSION:  04/18/2012 DATE OF DISCHARGE:  04/20/2012  ADMITTING DIAGNOSIS: Severe hyperglycemia in the form of hyperosmolar nonketotic hyperglycemia.  DISCHARGE DIAGNOSES: 1. Diabetes mellitus type 2 poorly controlled with severe hyperglycemia. No acidosis. No diabetic ketoacidosis.  2. Hyponatremia.  3. Hypomagnesemia.  4. Medical noncompliance. 5. Thrombocytopenia.  6. Crohn's disease.  7. Chronic back pain.  8. Sickle cell trait.  9. Depression.  10. Post-traumatic stress disorder.  11. Gastroesophageal reflux disease.   DISCHARGE CONDITION: Stable.   DISCHARGE MEDICATIONS: Patient is to resume his outpatient medications which are:  1. Celexa 40 mg p.o. daily.  2. Metformin 1 gram p.o. twice daily. 3. NovoLog Mix 70/30, 75 units subcutaneously twice daily.  4. Asacol HD 800 mg p.o. 2 tablets 3 times daily.  5. AndroGel 5 gram packet 1 dose topically daily.   HOME OXYGEN: None.   DIET: 1800 ADA, low salt.   ACTIVITY LIMITATIONS: As tolerated.   FOLLOW UP: Follow-up appointment with [facility] in two days after discharge.   CONSULTANTS: Dr. Tedd Sias, endocrinology:   HISTORY OF PRESENT ILLNESS: Patient is a 39 year old African American male with past medical history significant for history of Crohn's disease, diabetes mellitus insulin-dependent, presented to hospital with complaints of elevated blood glucose levels, generalized weakness as well as achiness, as well as dizziness, and vomiting. Please refer to Dr. Riley Nearing admission note on 04/18/2012. On arrival to the hospital, patient's blood pressure 115/72, respiration rate 17, pulse 72, temperature 97.5, oxygen saturation 98% on room air. Physical examination was unremarkable.   LABORATORY, DIAGNOSTIC AND RADIOLOGICAL DATA: Abdomen three-way including PA of chest 04/17/2012 revealing nonobstructive bowel gas pattern. There is  high density material within transverse colon and descending colon which could be related to oral contrast material received at an outside institution. Correlate clinically according to radiologist.   Patient's lab data done on 04/17/2012 showed markedly elevated glucose to 1099, BUN and creatinine were also elevated to 19 and 1.60, sodium 127, otherwise BMP was unremarkable. Patient's liver enzymes showed elevated total protein to 8.5, alkaline phosphatase 138, otherwise liver enzymes were unremarkable. Patient's CBC white blood cell count 9.9, hemoglobin 13.7, platelet count low at 128. Urinalysis was unremarkable. Patient's three-way abdominal x-ray including PA of chest were also unremarkable.   HOSPITAL COURSE: Patient was admitted to the hospital for further evaluation and insulin drip. Consultation with endocrinologist, Dr. Tedd Sias, was obtained. Dr. Tedd Sias saw patient in consultation as patient stayed in the hospital and regulated his insulin regimen. She changed patient's insulin drip to insulin 70/30, 75 units twice daily dose. With this dose, 75 units daily dose, patient's blood glucose levels are ranging at around 160s to 200s, however, patient's blood glucose levels would go down to 70s early in the morning hours. It was felt that this particular dose may be sufficient for this patient. Patient needs to control his diet, following diabetic diet to get his diabetes into better control. He also needs to lose weight. This was communicated to the patient as he is being discharged. His hemoglobin A1c was checked and was found to be 14.2. Patient was advised also to continue metformin.   In regards to hyponatremia, patient's sodium level improved with IV fluid administration. Patient was dehydrated. He also had acute renal failure with creatinine level of 1.6 on day of admission. With rehydration patient's sodium level as well as creatinine improved. Already on 04/18/2012 patient's  BUN and creatinine were  14 and 0.68. Potassium level was also found to be somewhat lower. Potassium was replenished. Magnesium was also found to be low and also was replenished. Part of his hyperglycemia very likely is noncompliance. As soon as patient takes his medications his blood glucose levels are much higher than 1000.   In regards to thrombocytopenia, patient's thrombocytopenia was followed. Initially, as mentioned above, patient's platelet count was noted to be low at 128. Patient's platelet count remained stable around 123 on day of discharge.   In regards to Crohn's disease, patient is to continue Asacol. No significant discomfort was noted in the hospital and no diarrhea was reported.  In regards to chronic back pain, patient was requesting pain medications in the hospital as well as upon discharge. Unfortunately, we were not able to provide him with any medications to be taken from the hospital. He needs to communicate to his primary care physician or his spine specialist his pain issues and discuss pain management with them.   Patient was requesting AndroGel. It was unclear if he in fact has hypogonadism, however, only two weeks prescription was filled and no other refills were authorized. Patient is to discuss with Dr. Dorothey Baseman in regards to his AndroGel use.   Patient is being discharged in stable condition with above-mentioned medications and follow-up. His vital signs on the day of discharge: Temperature 97.6, pulse 62, respiratory rate 18, blood pressure 122/82, saturation 99% on room air at rest.   TIME SPENT: 40 minutes.   ____________________________ Katharina Caper, MD rv:cms D: 04/20/2012 12:56:16 ET T: 04/20/2012 13:20:39 ET JOB#: [id number]  cc: Katharina Caper, MD, <Dictator> [facility], Corky Sox MD ELECTRONICALLY SIGNED 04/28/2012 18:38

## 2015-01-31 NOTE — H&P (Signed)
PATIENT NAME:  Roberto [name], Roberto [name] MR#:  [id number] DATE OF BIRTH:  09/17/1976  DATE OF ADMISSION:  12/03/2011  REFERRING PHYSICIAN: [name], MD  FAMILY PHYSICIAN: [facility]  REASON FOR ADMISSION: Diffuse arthralgias and myalgias associated with dehydration and hyperglycemia.   HISTORY OF PRESENT ILLNESS: The patient is a 39 year old male with a history of type 2 diabetes, on insulin, as well as sickle cell trait, chronic anemia, and Crohn's disease. He presents to the Emergency Room complaining of diffuse arthralgias and myalgias with lethargy and fatigue. In the Emergency Room, the patient was found to have a blood sugar greater than 800. He states that he did have some nausea, vomiting, and dizziness earlier in the day. His anion gap in the Emergency Room was normal. He has been given IV fluids here for hydration with only minimal improvement of his blood sugars. He is now admitted for further evaluation.   PAST MEDICAL HISTORY:  1. Type 2 diabetes, on insulin.  2. Crohn's disease.  3. Chronic back pain.  4. Status post right foot surgery.  5. Sickle cell trait.  6. Chronic anemia.   MEDICATIONS:  1. Celexa 20 mg p.o. daily. 2. Lantus 40 units subcutaneous at bedtime.  3. Metformin 500 mg p.o. twice a day. 4. Regular insulin per sliding scale.   ALLERGIES: No known drug allergies.  SOCIAL HISTORY: The patient denies alcohol or tobacco abuse.   FAMILY HISTORY: Positive for diabetes and hypertension.  REVIEW OF SYSTEMS: CONSTITUTIONAL: No fever or change in weight. EYES: No blurred or double vision. No glaucoma. ENT: No tinnitus or hearing loss. No nasal discharge or bleeding. No difficulty swallowing. RESPIRATORY: No cough or wheezing. No hemoptysis. No painful respiration. CARDIOVASCULAR: No chest pain or orthopnea. No palpitations or syncope. GI: No diarrhea or abdominal pain. No change in bowel habits. GU: No dysuria or hematuria. No incontinence. ENDOCRINE: No polyuria  or polydipsia. No heat or cold intolerance. HEMATOLOGIC: The patient admits to anemia. Denies easy bruising or bleeding. LYMPHATIC: No swollen glands. MUSCULOSKELETAL: The patient has diffuse pain in his neck, back, shoulders, knees, and hips. No gout. NEUROLOGIC: No numbness, although he does have generalized weakness. Denies migraines, stroke, or seizures. PSYCH: The patient denies anxiety and insomnia, although he does admit to a history of depression.   PHYSICAL EXAMINATION:   GENERAL: The patient is in no acute distress.   VITAL SIGNS: Vital signs are currently remarkable for a blood pressure of 148/84 with a heart rate of 94 and a respiratory rate of 20. He is afebrile.   HEENT: Normocephalic, atraumatic. Pupils are equally round and reactive to light and accommodation. Extraocular movements are intact. Sclerae anicteric. Conjunctivae are clear. Oropharynx is dry, but clear.   NECK: Supple without JVD or bruits. No adenopathy or thyromegaly was noted.   LUNGS: Clear to auscultation and percussion without wheezes, rales, or rhonchi. No dullness.   HEART: Regular rate and rhythm with normal S1 and S2. No significant rubs, murmurs, or gallops. PMI is nondisplaced. Chest wall is nontender.   ABDOMEN: Soft and nontender with normal active bowel sounds. No organomegaly or masses were appreciated. No hernias or bruits were noted.   EXTREMITIES: No clubbing, cyanosis, or edema. Pulses were 2+ bilaterally.   SKIN: Warm and dry without rash or lesions.  NEURO: Cranial nerves II through XII grossly intact. Deep tendon reflexes were symmetric. Motor and sensory examination is nonfocal.  PSYCH: Alert and oriented to person, place, and time. Cooperative and used  good judgment.   LABS/STUDIES: Urinalysis was negative, except for glycosuria.   White count was 7.4 with a hemoglobin of 12.3 and a platelet count of 133. Sodium was 133 with a glucose of 821.   ASSESSMENT:   1. Hyperglycemia. 2. Dehydration. 3. Hyponatremia.  4. Type 2 diabetes mellitus.  5. Anemia of chronic disease.  6. Sickle cell trait.  7. Mild thrombocytopenia.  8. Crohn's disease.   PLAN: The patient will be admitted to the floor with IV normal saline and high dose sliding scale insulin. He will be given one dose of IV insulin in the Emergency Room prior to his admission. We will continue IV fluids with sliding scale. We will resume his basal Lantus at an increased dose. We will follow his sugars closely and adjust his regimen as needed. Follow up routine labs in the morning. Continue Celexa for now. Further treatment and evaluation will depend upon the patient's progress.   TOTAL TIME SPENT ON THIS PATIENT: 50 minutes.  ____________________________ Duane Lope Judithann Sheen, MD jds:slb D: 12/03/2011 00:36:54 ET     T: 12/03/2011 10:08:25 ET       JOB#: [id number] cc: Duane Lope. Judithann Sheen, MD, <Dictator> [facility] Kellina Dreese Rodena Medin MD ELECTRONICALLY SIGNED 12/03/2011 16:47

## 2015-01-31 NOTE — Discharge Summary (Signed)
PATIENT NAME:  Roberto [name], Roberto [name] MR#:  [id number] DATE OF BIRTH:  19-Mar-1976  DATE OF ADMISSION:  02/19/2012 DATE OF DISCHARGE:  02/23/2012  DISCHARGE DIAGNOSES:  1. Diabetic ketoacidosis.  2. Diabetes. 3. Thrombocytopenia. 4. Hypomagnesemia. 5. Dehydration. 6. Renal insufficiency.  PROCEDURES: None.  CONDITION: Stable.   HOME MEDICATIONS:  1. Metformin 500 mg p.o. twice a day.  2. Celexa 20 mg p.o. daily.   ADDITIONAL MEDICATIONS:  1. Novolin 70/30, 50 units subcutaneous twice a day.  2. Nicotine patch 14 mcg transdermal patch daily.   DIET: ADA diet.   ACTIVITY: As tolerated.   FOLLOWUP CARE: Followup with PCP within 1 to 2 weeks. Followup with Dr. Leia Alf within one week for bone marrow biopsy, probably this coming Tuesday.  CONSULTANTS:  1. Leia Alf, MD - Hematology. 2. Lavone Orn, MD.  HOSPITAL COURSE: The patient is a 39 year old African American male with a history of type 1 diabetes, Crohn's disease, chronic back pain, chronic abdominal pain, chronic anemia and thrombocytopenia, depression, and noncompliance who presented to the ED with body aches, fatigue, nausea, and vomiting. For a detailed history and physical examination, please refer to the admission note dictated by Dr. Brien Few Phichith.   LABS/STUDIES: On admission date the patient's labs are as follows: Glucose 884, BUN 20, creatinine 1.16, sodium 126, potassium 4.1, bicarbonate 19, and chloride 90. WBC 10, hemoglobin 13.8, and platelets 115. Anion gap 17.   Urinalysis showed ketones 2+. No RBC or WBC.   HOSPITAL COURSE: The patient was admitted for DKA and control of diabetes. He was admitted to the Critical Care Unit and started on an insulin drip, which was gradually tapered off after blood sugar became controlled. In addition, the patient was treated with IV fluids. His hemoglobin was 15.8. After insulin drip, Dr. Gabriel Carina recommended Humulin 70/30 at 50 units twice a day. The patient's  blood sugar has been under control with this medication. In addition, the patient's hyponatremia, renal insufficiency, and dehydration improved after the above-mentioned treatment; however, the patient has chronic thrombocytopenia. Dr. Ma Hillock was requested for consult. He suspected possible idiopathic thrombocytopenia purpura and suggested bone marrow biopsy. In addition, the patient developed hypokalemia and hypomagnesia which was treated with supplements. The patient's blood sugar has been stable since yesterday. The patient has no complaints. Vital signs are stable. Physical examination is unremarkable.   Today magnesium is 1.6, potassium 3.9, glucose 135, BUN 10, and creatinine 0.7. The patient is clinically stable and will be discharged to a homeless shelter. The patient is homeless. I discussed the patient's situation with the social worker, Mr. Tobie Poet. He arranged the homeless shelter at discharge and followup care. The patient will be discharged to a homeless shelter today. The patient needs followup with Dr. Ma Hillock as an outpatient and follow up with PCP.   TIME SPENT: About 50 minutes. ____________________________ Demetrios Loll, MD qc:slb D: 02/23/2012 14:55:18 ET     T: 02/24/2012 12:43:15 ET       JOB#: [id number] Demetrios Loll MD ELECTRONICALLY SIGNED 02/26/2012 14:19

## 2016-04-13 ENCOUNTER — Encounter: Payer: Self-pay | Admitting: Emergency Medicine

## 2016-04-13 ENCOUNTER — Emergency Department
Admission: EM | Admit: 2016-04-13 | Discharge: 2016-04-14 | Disposition: A | Discharge status: Home / Self Care | Payer: Medicaid Other | Source: Home / Self Care | Attending: Emergency Medicine | Admitting: Emergency Medicine

## 2016-04-13 DIAGNOSIS — Z794 Long term (current) use of insulin: Secondary | ICD-10-CM | POA: Insufficient documentation

## 2016-04-13 DIAGNOSIS — F17210 Nicotine dependence, cigarettes, uncomplicated: Secondary | ICD-10-CM

## 2016-04-13 DIAGNOSIS — R739 Hyperglycemia, unspecified: Secondary | ICD-10-CM

## 2016-04-13 DIAGNOSIS — F1290 Cannabis use, unspecified, uncomplicated: Secondary | ICD-10-CM

## 2016-04-13 DIAGNOSIS — K50911 Crohn's disease, unspecified, with rectal bleeding: Secondary | ICD-10-CM | POA: Diagnosis not present

## 2016-04-13 DIAGNOSIS — E1165 Type 2 diabetes mellitus with hyperglycemia: Secondary | ICD-10-CM | POA: Insufficient documentation

## 2016-04-13 DIAGNOSIS — E119 Type 2 diabetes mellitus without complications: Secondary | ICD-10-CM | POA: Diagnosis not present

## 2016-04-13 DIAGNOSIS — R1030 Lower abdominal pain, unspecified: Secondary | ICD-10-CM | POA: Diagnosis present

## 2016-04-13 HISTORY — DX: Polyneuropathy, unspecified: G62.9

## 2016-04-13 LAB — BASIC METABOLIC PANEL
Anion gap: 11 (ref 5–15)
BUN: 8 mg/dL (ref 6–20)
CHLORIDE: 94 mmol/L — AB (ref 101–111)
CO2: 25 mmol/L (ref 22–32)
CREATININE: 0.72 mg/dL (ref 0.61–1.24)
Calcium: 9.1 mg/dL (ref 8.9–10.3)
GFR calc non Af Amer: >60 mL/min (ref >60)
Glucose, Bld: 627 mg/dL (ref 65–99)
POTASSIUM: 3.7 mmol/L (ref 3.5–5.1)
SODIUM: 130 mmol/L — AB (ref 135–145)

## 2016-04-13 LAB — GLUCOSE, CAPILLARY
GLUCOSE-CAPILLARY: 554 mg/dL — AB (ref 65–99)
GLUCOSE-CAPILLARY: 310 mg/dL — AB (ref 65–99)
Glucose-Capillary: 223 mg/dL — ABNORMAL HIGH (ref 65–99)
Glucose-Capillary: 321 mg/dL — ABNORMAL HIGH (ref 65–99)
Glucose-Capillary: 350 mg/dL — ABNORMAL HIGH (ref 65–99)

## 2016-04-13 LAB — CBC
HEMATOCRIT: 41.7 % (ref 40.0–52.0)
HEMOGLOBIN: 13.8 g/dL (ref 13.0–18.0)
MCH: 26.3 pg (ref 26.0–34.0)
MCHC: 33 g/dL (ref 32.0–36.0)
MCV: 79.7 fL — AB (ref 80.0–100.0)
Platelets: 134 10*3/uL — ABNORMAL LOW (ref 150–440)
RBC: 5.23 MIL/uL (ref 4.40–5.90)
RDW: 13.9 % (ref 11.5–14.5)
WBC: 9.9 10*3/uL (ref 3.8–10.6)

## 2016-04-13 LAB — URINALYSIS COMPLETE WITH MICROSCOPIC (ARMC ONLY)
BACTERIA UA: NONE SEEN
Bilirubin Urine: NEGATIVE
Glucose, UA: >500 mg/dL — AB
Hgb urine dipstick: NEGATIVE
KETONES UR: NEGATIVE mg/dL
Nitrite: NEGATIVE
PH: 6 (ref 5.0–8.0)
PROTEIN: NEGATIVE mg/dL
Specific Gravity, Urine: 1.024 (ref 1.005–1.030)

## 2016-04-13 MED ORDER — MORPHINE SULFATE (PF) 4 MG/ML IV SOLN
4.0000 mg | Freq: Once | INTRAVENOUS | Status: AC
Start: 2016-04-13 — End: 2016-04-13
  Administered 2016-04-13: 4 mg via INTRAVENOUS
  Filled 2016-04-13: qty 1

## 2016-04-13 MED ORDER — SODIUM CHLORIDE 0.9 % IV BOLUS (SEPSIS)
1000.0000 mL | Freq: Once | INTRAVENOUS | Status: AC
  Administered 2016-04-13: 1000 mL via INTRAVENOUS

## 2016-04-13 MED ORDER — TRAMADOL HCL 50 MG PO TABS: 50.0000 mg | ORAL_TABLET | Freq: Four times a day (QID) | ORAL | Status: DC | PRN

## 2016-04-13 MED ORDER — MORPHINE SULFATE (PF) 4 MG/ML IV SOLN
4.0000 mg | Freq: Once | INTRAVENOUS | Status: AC
  Administered 2016-04-13: 4 mg via INTRAVENOUS
  Filled 2016-04-13: qty 1

## 2016-04-13 MED ORDER — ONDANSETRON HCL 4 MG/2ML IJ SOLN
4.0000 mg | Freq: Once | INTRAMUSCULAR | Status: AC
  Administered 2016-04-13: 4 mg via INTRAVENOUS
  Filled 2016-04-13: qty 2

## 2016-04-13 MED ORDER — INSULIN ASPART 100 UNIT/ML ~~LOC~~ SOLN
4.0000 [IU] | Freq: Once | SUBCUTANEOUS | Status: AC
  Administered 2016-04-13: 4 [IU] via INTRAVENOUS
  Filled 2016-04-13: qty 4

## 2016-04-13 MED ORDER — METFORMIN HCL 1000 MG PO TABS: 1000.0000 mg | ORAL_TABLET | Freq: Two times a day (BID) | ORAL | Status: DC

## 2016-04-13 MED ORDER — GABAPENTIN 300 MG PO CAPS: 300.0000 mg | ORAL_CAPSULE | Freq: Three times a day (TID) | ORAL | Status: DC

## 2016-04-13 MED ORDER — INSULIN ASPART 100 UNIT/ML ~~LOC~~ SOLN
10.0000 [IU] | Freq: Once | SUBCUTANEOUS | Status: AC
  Administered 2016-04-13: 10 [IU] via INTRAVENOUS
  Filled 2016-04-13: qty 10

## 2016-04-13 NOTE — ED Notes (Signed)
Pt presents with reports of bilateral leg pain from feet up to his legs. Pt states he has diabetic neuropathy but the pain worse today. Pt states his CBG has been running in the 300s. CBG in triage 554.

## 2016-04-13 NOTE — ED Provider Notes (Signed)
[facility] Emergency Department Provider Note  Time seen: 10:40 PM  I have reviewed the triage vital signs and the nursing notes.   HISTORY  Chief Complaint Hyperglycemia and Leg Pain    HPI Roberto [name] is a 40 y.o. male with a past medical history of diabetes, Crohn's, peripheral neuropathy who presents the emergency department with lower extremity pain and tingling as well as elevated blood glucose. According to the patient he takes NovoLog insulin 3 times a day. States for the past few days his blood glucose has been elevated in the 3-500 range regardless of his diet. Patient states his peripheral neuropathy has been worsening over the past few days which she relates to his eye blood sugars. States he has a numbness sensation in his feet, but states pins and needle sensation which has become severe in his legs. Patient recently moved to the area and does not have a primary care physician currently.     Past Medical History  Diagnosis Date  . Diabetes mellitus   . Crohn disease (HCC)   . PTSD (post-traumatic stress disorder)   . Neuropathy (HCC)     There are no active problems to display for this patient.   Past Surgical History  Procedure Laterality Date  . Cholecystectomy    . Orchiectomy    . Foot surgery      Current Outpatient Rx  Name  Route  Sig  Dispense  Refill  . citalopram (CELEXA) 40 MG tablet   Oral   Take 40 mg by mouth daily.         Marland Kitchen ibuprofen (ADVIL,MOTRIN) 600 MG tablet   Oral   Take 600 mg by mouth every 6 (six) hours as needed. For pain         . insulin NPH-insulin regular (NOVOLIN 70/30) (70-30) 100 UNIT/ML injection   Subcutaneous   Inject 75 Units into the skin 2 (two) times daily.   10 mL   2   . mesalamine (ASACOL) 400 MG EC tablet   Oral   Take 800 mg by mouth 3 (three) times daily.         . metFORMIN (GLUCOPHAGE) 500 MG tablet   Oral   Take 500 mg by mouth 2 (two) times daily with a meal.           Allergies Cherry; Acetaminophen; and Codeine  Family History  Problem Relation Age of Onset  . Diabetes Mother   . Diabetes Father     Social History Social History  Substance Use Topics  . Smoking status: Current Every Day Smoker -- 0.50 packs/day    Types: Cigarettes  . Smokeless tobacco: None  . Alcohol Use: No    Review of Systems Constitutional: Negative for fever. Cardiovascular: Negative for chest pain. Respiratory: Negative for shortness of breath. Gastrointestinal: Negative for abdominal pain Neurological: Negative for headache 10-point ROS otherwise negative.  ____________________________________________   PHYSICAL EXAM:  VITAL SIGNS: ED Triage Vitals  Enc Vitals Group     BP 04/13/16 1831 132/93 mmHg     Pulse Rate 04/13/16 1831 109     Resp 04/13/16 1831 16     Temp 04/13/16 1831 98.4 F (36.9 C)     Temp Source 04/13/16 1831 Oral     SpO2 04/13/16 1831 99 %     Weight 04/13/16 1831 185 lb (83.915 kg)     Height 04/13/16 1831 5\' 11"  (1.803 m)     Head Cir --  Peak Flow --      Pain Score 04/13/16 1831 8     Pain Loc --      Pain Edu? --      Excl. in GC? --     Constitutional: Alert and oriented. Well appearing and in no distress. Eyes: Normal exam ENT   Head: Normocephalic and atraumatic   Mouth/Throat: Mucous membranes are moist. Cardiovascular: Normal rate, regular rhythm. No murmur Respiratory: Normal respiratory effort without tachypnea nor retractions. Breath sounds are clear  Gastrointestinal: Soft and nontender. No distention.  Musculoskeletal: Nontender with normal range of motion in all extremities.  Neurologic:  Normal speech and language. No gross focal neurologic deficits Skin:  Skin is warm, dry and intact.  Psychiatric: Mood and affect are normal.   ____________________________________________    INITIAL IMPRESSION / ASSESSMENT AND PLAN / ED COURSE  Pertinent labs & imaging results that were  available during my care of the patient were reviewed by me and considered in my medical decision making (see chart for details).  The patient presents the emergency department with elevated blood glucose and lower extremity pain. The patient's pain he describes in his lower extremities is very consistent with peripheral neuropathy. States he has gabapentin but has not been taking it because it was not helping with his pain. Patient states he takes NovoLog 3 times a day denies any other medications. Patient's blood glucose is elevated in the 500s/600s, however his anion gap is normal. We will dose IV fluids, IV insulin, potassium was 3.7. We will closely monitor in the emergency department. I discussed with the patient the need to follow up with a primary care doctor to discuss his current diabetic regimen. In the meantime we will also start the patient on metformin 1000 mg twice a day in addition to his insulin. Kidney function is normal. Patient is agreeable to this plan.  Blood glucose 320. We'll recheck one hour. Patient care signed out to Dr. [name].  ____________________________________________   FINAL CLINICAL IMPRESSION(S) / ED DIAGNOSES  Hyperglycemia. Peripheral neuropathy  Minna Antis, MD 04/13/16 [phone]

## 2016-04-13 NOTE — Discharge Instructions (Signed)
Hyperglycemia °Hyperglycemia occurs when the glucose (sugar) in your blood is too high. Hyperglycemia can happen for many reasons, but it most often happens to people who do not know they have diabetes or are not managing their diabetes properly.  °CAUSES  °Whether you have diabetes or not, there are other causes of hyperglycemia. Hyperglycemia can occur when you have diabetes, but it can also occur in other situations that you might not be as aware of, such as: °Diabetes °· If you have diabetes and are having problems controlling your blood glucose, hyperglycemia could occur because of some of the following reasons: °¨ Not following your meal plan. °¨ Not taking your diabetes medications or not taking it properly. °¨ Exercising less or doing less activity than you normally do. °¨ Being sick. °Pre-diabetes °· This cannot be ignored. Before people develop Type 2 diabetes, they almost always have "pre-diabetes." This is when your blood glucose levels are higher than normal, but not yet high enough to be diagnosed as diabetes. Research has shown that some long-term damage to the body, especially the heart and circulatory system, may already be occurring during pre-diabetes. If you take action to manage your blood glucose when you have pre-diabetes, you may delay or prevent Type 2 diabetes from developing. °Stress °· If you have diabetes, you may be "diet" controlled or on oral medications or insulin to control your diabetes. However, you may find that your blood glucose is higher than usual in the hospital whether you have diabetes or not. This is often referred to as "stress hyperglycemia." Stress can elevate your blood glucose. This happens because of hormones put out by the body during times of stress. If stress has been the cause of your high blood glucose, it can be followed regularly by your caregiver. That way he/she can make sure your hyperglycemia does not continue to get worse or progress to  diabetes. °Steroids °· Steroids are medications that act on the infection fighting system (immune system) to block inflammation or infection. One side effect can be a rise in blood glucose. Most people can produce enough extra insulin to allow for this rise, but for those who cannot, steroids make blood glucose levels go even higher. It is not unusual for steroid treatments to "uncover" diabetes that is developing. It is not always possible to determine if the hyperglycemia will go away after the steroids are stopped. A special blood test called an A1c is sometimes done to determine if your blood glucose was elevated before the steroids were started. °SYMPTOMS °· Thirsty. °· Frequent urination. °· Dry mouth. °· Blurred vision. °· Tired or fatigue. °· Weakness. °· Sleepy. °· Tingling in feet or leg. °DIAGNOSIS  °Diagnosis is made by monitoring blood glucose in one or all of the following ways: °· A1c test. This is a chemical found in your blood. °· Fingerstick blood glucose monitoring. °· Laboratory results. °TREATMENT  °First, knowing the cause of the hyperglycemia is important before the hyperglycemia can be treated. Treatment may include, but is not be limited to: °· Education. °· Change or adjustment in medications. °· Change or adjustment in meal plan. °· Treatment for an illness, infection, etc. °· More frequent blood glucose monitoring. °· Change in exercise plan. °· Decreasing or stopping steroids. °· Lifestyle changes. °HOME CARE INSTRUCTIONS  °· Test your blood glucose as directed. °· Exercise regularly. Your caregiver will give you instructions about exercise. Pre-diabetes or diabetes which comes on with stress is helped by exercising. °· Eat wholesome,   balanced meals. Eat often and at regular, fixed times. Your caregiver or nutritionist will give you a meal plan to guide your sugar intake. °· Being at an ideal weight is important. If needed, losing as little as 10 to 15 pounds may help improve blood  glucose levels. °SEEK MEDICAL CARE IF:  °· You have questions about medicine, activity, or diet. °· You continue to have symptoms (problems such as increased thirst, urination, or weight gain). °SEEK IMMEDIATE MEDICAL CARE IF:  °· You are vomiting or have diarrhea. °· Your breath smells fruity. °· You are breathing faster or slower. °· You are very sleepy or incoherent. °· You have numbness, tingling, or pain in your feet or hands. °· You have chest pain. °· Your symptoms get worse even though you have been following your caregiver's orders. °· If you have any other questions or concerns. °  °This information is not intended to replace advice given to you by your health care provider. Make sure you discuss any questions you have with your health care provider. °  °Document Released: 03/21/2001 Document Revised: 12/18/2011 Document Reviewed: 06/01/2015 °Elsevier Interactive Patient Education ©2016 [employer] Inc[employer] ° °

## 2016-04-14 ENCOUNTER — Encounter: Payer: Self-pay | Admitting: Emergency Medicine

## 2016-04-14 ENCOUNTER — Emergency Department
Admission: EM | Admit: 2016-04-14 | Discharge: 2016-04-15 | Disposition: A | Discharge status: Home / Self Care | Payer: Medicaid Other | Attending: Emergency Medicine | Admitting: Emergency Medicine

## 2016-04-14 ENCOUNTER — Emergency Department: Payer: Medicaid Other

## 2016-04-14 DIAGNOSIS — Z794 Long term (current) use of insulin: Secondary | ICD-10-CM | POA: Insufficient documentation

## 2016-04-14 DIAGNOSIS — F17210 Nicotine dependence, cigarettes, uncomplicated: Secondary | ICD-10-CM | POA: Insufficient documentation

## 2016-04-14 DIAGNOSIS — K625 Hemorrhage of anus and rectum: Secondary | ICD-10-CM

## 2016-04-14 DIAGNOSIS — E119 Type 2 diabetes mellitus without complications: Secondary | ICD-10-CM | POA: Insufficient documentation

## 2016-04-14 DIAGNOSIS — K50911 Crohn's disease, unspecified, with rectal bleeding: Secondary | ICD-10-CM | POA: Insufficient documentation

## 2016-04-14 HISTORY — DX: Sickle-cell disease without crisis: D57.1

## 2016-04-14 LAB — COMPREHENSIVE METABOLIC PANEL
ALK PHOS: 134 U/L — AB (ref 38–126)
ALT: 86 U/L — AB (ref 17–63)
AST: 80 U/L — ABNORMAL HIGH (ref 15–41)
Albumin: 3.9 g/dL (ref 3.5–5.0)
Anion gap: 11 (ref 5–15)
CALCIUM: 9 mg/dL (ref 8.9–10.3)
CHLORIDE: 98 mmol/L — AB (ref 101–111)
CO2: 27 mmol/L (ref 22–32)
CREATININE: 0.55 mg/dL — AB (ref 0.61–1.24)
GFR calc non Af Amer: >60 mL/min (ref >60)
Glucose, Bld: 386 mg/dL — ABNORMAL HIGH (ref 65–99)
Potassium: 3.4 mmol/L — ABNORMAL LOW (ref 3.5–5.1)
SODIUM: 136 mmol/L (ref 135–145)
Total Bilirubin: 1 mg/dL (ref 0.3–1.2)
Total Protein: 7.1 g/dL (ref 6.5–8.1)

## 2016-04-14 LAB — CBC
HCT: 41.9 % (ref 40.0–52.0)
HEMOGLOBIN: 14.3 g/dL (ref 13.0–18.0)
MCH: 26.9 pg (ref 26.0–34.0)
MCHC: 34.2 g/dL (ref 32.0–36.0)
MCV: 78.7 fL — AB (ref 80.0–100.0)
Platelets: 133 10*3/uL — ABNORMAL LOW (ref 150–440)
RBC: 5.33 MIL/uL (ref 4.40–5.90)
RDW: 13.9 % (ref 11.5–14.5)
WBC: 8.8 10*3/uL (ref 3.8–10.6)

## 2016-04-14 LAB — LIPASE, BLOOD: Lipase: 15 U/L (ref 11–51)

## 2016-04-14 LAB — PROTIME-INR
INR: 1.14
PROTHROMBIN TIME: 14.8 s (ref 11.4–15.0)

## 2016-04-14 MED ORDER — ONDANSETRON HCL 4 MG/2ML IJ SOLN
4.0000 mg | Freq: Once | INTRAMUSCULAR | Status: AC
  Administered 2016-04-14: 4 mg via INTRAVENOUS
  Filled 2016-04-14: qty 2

## 2016-04-14 MED ORDER — PREDNISONE 20 MG PO TABS: 40.0000 mg | ORAL_TABLET | Freq: Every day | ORAL | Status: DC

## 2016-04-14 MED ORDER — IOPAMIDOL (ISOVUE-300) INJECTION 61%
100.0000 mL | Freq: Once | INTRAVENOUS | Status: AC | PRN
  Administered 2016-04-14: 100 mL via INTRAVENOUS

## 2016-04-14 MED ORDER — SODIUM CHLORIDE 0.9 % IV BOLUS (SEPSIS)
1000.0000 mL | Freq: Once | INTRAVENOUS | Status: AC
  Administered 2016-04-14: 1000 mL via INTRAVENOUS

## 2016-04-14 MED ORDER — DIATRIZOATE MEGLUMINE & SODIUM 66-10 % PO SOLN
15.0000 mL | Freq: Once | ORAL | Status: AC
  Administered 2016-04-14: 15 mL via ORAL

## 2016-04-14 MED ORDER — MORPHINE SULFATE (PF) 4 MG/ML IV SOLN
4.0000 mg | Freq: Once | INTRAVENOUS | Status: AC
  Administered 2016-04-14: 4 mg via INTRAVENOUS
  Filled 2016-04-14: qty 1

## 2016-04-14 MED ORDER — OXYCODONE HCL 5 MG PO TABS: 5.0000 mg | ORAL_TABLET | Freq: Three times a day (TID) | ORAL | Status: DC | PRN

## 2016-04-14 MED ORDER — OXYCODONE HCL 5 MG PO TABS
10.0000 mg | ORAL_TABLET | Freq: Once | ORAL | Status: AC
  Administered 2016-04-14: 10 mg via ORAL
  Filled 2016-04-14: qty 2

## 2016-04-14 NOTE — ED Notes (Signed)
Pt. Finished ct contrast, ct called.

## 2016-04-14 NOTE — ED Notes (Signed)
Pt. States he was treated here for hyperglycemia yesterday.  Pt. States multiple diarrhea episodes today.  Pt. States lower mid abdominal pain today.  Pt. States bright red blood to tissue, after bowel movement today.

## 2016-04-14 NOTE — ED Notes (Signed)
Pt. Here via EMS from home for lower abdominal pain.  Pt. States he was here yesterday for hyperglycemia.

## 2016-04-14 NOTE — ED Notes (Signed)
Pt. Returned to room after CT.  Pt. Encouraged to give urine specimen.  Pt. Given urinal.

## 2016-04-14 NOTE — Discharge Instructions (Signed)
Crohn Disease Crohn disease is a long-lasting (chronic) disease that affects your gastrointestinal (GI) tract. It often causes irritation and swelling (inflammation) in your small intestine and the beginning of your large intestine. However, it can affect any part of your GI tract. Crohn disease is part of a group of illnesses that are known as inflammatory bowel disease (IBD). Crohn disease may start slowly and get worse over time. Symptoms may come and go. They may also disappear for months or even years at a time (remission). CAUSES The exact cause of Crohn disease is not known. It may be a response that causes your body's defense system (immune system) to mistakenly attack healthy cells and tissues (autoimmune response). Your genes and your environment may also play a role. RISK FACTORS You may be at greater risk for Crohn disease if you:  Have other family members with Crohn disease or another IBD.  Use any tobacco products, including cigarettes, chewing tobacco, or electronic cigarettes.  Are in your 20s.  Have Eastern European ancestry. SIGNS AND SYMPTOMS The main signs and symptoms of Crohn disease involve your GI tract. These include:  Diarrhea.  Rectal bleeding.  An urgent need to move your bowels.  The feeling that you are not finished having a bowel movement.  Abdominal pain or cramping.  Constipation. General signs and symptoms of Crohn disease may also include:  Unexplained weight loss.  Fatigue.  Fever.  Nausea.  Loss of appetite.  Joint pain  Changes in vision.  Red bumps on your skin. DIAGNOSIS Your health care provider may suspect Crohn disease based on your symptoms and your medical history. Your health care provider will do a physical exam. You may need to see a health care provider who specializes in diseases of the digestive tract (gastroenterologist). You may also have tests to help your health care providers make a diagnosis. These may  include:  Blood tests.  Stool sample tests.  Imaging tests, such as X-rays and CT scans.  Tests to examine the inside of your intestines using a long, flexible tube that has a light and a camera on the end (endoscopy or colonoscopy).  A procedure to take tissue samples from inside your bowel (biopsy) to be examined under a microscope. TREATMENT  There is no cure for Crohn disease. Treatment will focus on managing your symptoms. Crohn disease affects each person differently. Your treatment may include:  Resting your bowels. Drinking only clear liquids or getting nutrition through an IV for a period of time gives your bowels a chance to heal because they are not passing stools.  Medicines. These may be used alone or in combination (combination therapy). These may include antibiotic medicines. You may be given medicines that help to:  Reduce inflammation.  Control your immune system activity.  Fight infections.  Relieve cramps and prevent diarrhea.  Control your pain.  Surgery. You may need surgery if:  Medicines and other treatments are no longer working.  You develop complications from severe Crohn disease.  A section of your intestine becomes so damaged that it needs to be removed. HOME CARE INSTRUCTIONS  Take medicines only as directed by your health care provider.  If you were prescribed an antibiotic medicine, finish it all even if you start to feel better.  Keep all follow-up visits as directed by your health care provider. This is important.  Talk with your health care provider about changing your diet. This may help your symptoms. Your health care provide may recommend changes, such   as:  Drinking more fluids.  Avoiding milk and other foods that contain lactose.  Eating a low-fat diet.  Avoiding high-fiber foods, such as popcorn and nuts.  Avoiding carbonated beverages, such as soda.  Eating smaller meals more often rather than eating large  meals.  Keeping a food diary to identify foods that make your symptoms better or worse.  Do not use any tobacco products, including cigarettes, chewing tobacco, or electronic cigarettes. If you need help quitting, ask your health care provider.  Limit alcohol intake to no more than 1 drink per day for nonpregnant women and 2 drinks per day for men. One drink equals 12 ounces of beer, 5 ounces of wine, or 1 ounces of hard liquor.  Exercise daily or as directed by your health care provider. SEEK MEDICAL CARE IF:  You have diarrhea, abdominal cramps, and other gastrointestinal problems that are present almost all of the time.  Your symptoms do not improve with treatment.  You continue to lose weight.  You develop a rash or sores on your skin.  You develop eye problems.  You have a fever.   Your symptoms get worse.  You develop new symptoms. SEEK IMMEDIATE MEDICAL CARE IF:  You have bloody diarrhea.  You develop severe abdominal pain.  You cannot pass stools.   This information is not intended to replace advice given to you by your health care provider. Make sure you discuss any questions you have with your health care provider.   Document Released: 07/05/2005 Document Revised: 10/16/2014 Document Reviewed: 05/13/2014 Elsevier Interactive Patient Education 2016 [employer]vier Inc.  

## 2016-04-14 NOTE — ED Notes (Signed)
Pt. Has ride to pick him up in waiting room at midnight.  Pt. Given pain medication, pt. Will go to waiting room at 23:45 to meet up with ride.

## 2016-04-14 NOTE — ED Provider Notes (Signed)
[facility] Emergency Department Provider Note  Time seen: 8:33 PM  I have reviewed the triage vital signs and the nursing notes.   HISTORY  Chief Complaint Abdominal Pain and Diarrhea    HPI Roberto [name] is a 40 y.o. male with a past medical history of diabetes, Crohn's, peripheral neuropathy who presents the emergency department with rectal bleeding. Patient was seen in the emergency department yesterday for leg pain and an elevated blood glucose. States he has not yet filled his prescriptions however beginning this morning he has been experiencing significant diarrhea. He states tonight he wiped after having a bout of diarrhea and said he notices blood on toilet paper looked down in the toilet and said there was blood in the toilet. Describes the bleeding as bright red blood. States he has been having lower abdominal pain since this afternoon. He describes abdominal pain as moderate located in the lower mid abdomen as well as mild in the epigastrium. Denies nausea or vomiting. Denies any fever. Patient states he has been told that he has Crohn's disease, but he does not take anything for it. He no longer follows up with GI medicine.     Past Medical History  Diagnosis Date  . Diabetes mellitus   . Crohn disease (HCC)   . PTSD (post-traumatic stress disorder)   . Neuropathy (HCC)     There are no active problems to display for this patient.   Past Surgical History  Procedure Laterality Date  . Cholecystectomy    . Orchiectomy    . Foot surgery      Current Outpatient Rx  Name  Route  Sig  Dispense  Refill  . citalopram (CELEXA) 40 MG tablet   Oral   Take 40 mg by mouth daily.         Marland Kitchen gabapentin (NEURONTIN) 300 MG capsule   Oral   Take 1 capsule (300 mg total) by mouth 3 (three) times daily.   90 capsule   1   . ibuprofen (ADVIL,MOTRIN) 600 MG tablet   Oral   Take 600 mg by mouth every 6 (six) hours as needed. For pain         .  insulin NPH-insulin regular (NOVOLIN 70/30) (70-30) 100 UNIT/ML injection   Subcutaneous   Inject 75 Units into the skin 2 (two) times daily.   10 mL   2   . mesalamine (ASACOL) 400 MG EC tablet   Oral   Take 800 mg by mouth 3 (three) times daily.         . metFORMIN (GLUCOPHAGE) 1000 MG tablet   Oral   Take 1 tablet (1,000 mg total) by mouth 2 (two) times daily with a meal.   60 tablet   1   . traMADol (ULTRAM) 50 MG tablet   Oral   Take 1 tablet (50 mg total) by mouth every 6 (six) hours as needed.   15 tablet   0     Allergies Cherry; Acetaminophen; and Codeine  Family History  Problem Relation Age of Onset  . Diabetes Mother   . Diabetes Father     Social History Social History  Substance Use Topics  . Smoking status: Current Every Day Smoker -- 0.50 packs/day    Types: Cigarettes  . Smokeless tobacco: None  . Alcohol Use: No    Review of Systems Constitutional: Negative for fever. Cardiovascular: Negative for chest pain. Respiratory: Negative for shortness of breath. Gastrointestinal: Lower and epigastric  abdominal pain. Genitourinary: Negative for dysuria. Musculoskeletal: Negative for back pain. Neurological: Negative for headache 10-point ROS otherwise negative.  ____________________________________________   PHYSICAL EXAM:  VITAL SIGNS: ED Triage Vitals  Enc Vitals Group     BP 04/14/16 2016 136/91 mmHg     Pulse Rate 04/14/16 2016 100     Resp 04/14/16 2016 99     Temp 04/14/16 2016 98.6 F (37 C)     Temp Source 04/14/16 2016 Oral     SpO2 04/14/16 2013 98 %     Weight 04/14/16 2016 189 lb (85.73 kg)     Height 04/14/16 2016  (1.778 m)     Head Cir --      Peak Flow --      Pain Score 04/14/16 2018 9     Pain Loc --      Pain Edu? --      Excl. in GC? --     Constitutional: Alert and oriented. Well appearing and in no distress. Eyes: Normal exam ENT   Head: Normocephalic and atraumatic.   Mouth/Throat: Mucous  membranes are moist. Cardiovascular: Normal rate, regular rhythm. No murmur Respiratory: Normal respiratory effort without tachypnea nor retractions. Breath sounds are clear  Gastrointestinal: Soft, moderate suprapubic tenderness palpation, mild epigastric tenderness palpation. No rebound or guarding. No distention. Musculoskeletal: Nontender with normal range of motion in all extremities. Neurologic:  Normal speech and language. No gross focal neurologic deficits  Skin:  Skin is warm, dry and intact.  Psychiatric: Mood and affect are normal. ____________________________________________    INITIAL IMPRESSION / ASSESSMENT AND PLAN / ED COURSE  Pertinent labs & imaging results that were available during my care of the patient were reviewed by me and considered in my medical decision making (see chart for details).  The patient presents to emergency department with lower abdominal pain and bright red blood per rectum. Rectal exam shows a small external hemorrhoid, no stool in the vault but strongly guaiac positive mucus. We will check labs, does pain and nausea medication, IV hydrate and obtain a CT the abdomen/pelvis. Patient is agreeable to this plan.   ED negative. Given the patient's history of Crohn's disease we'll place a short course of steroids, I have advised him to monitor his blood sugars very closely and adjust his insulin accordingly. The patient is agreeable to this plan. He will follow-up with GI medicine by calling the number provided on Monday morning. Patient's labs are largely within normal limits besides a mildly elevated blood glucose patient has received a liter of IV fluids in the emergency department. Patient will dose his insulin when he gets home. I discussed strict return precautions with the patient for any worsening abdominal pain or increased bleeding. Patient agreeable.  ____________________________________________   FINAL CLINICAL IMPRESSION(S) / ED  DIAGNOSES  Rectal bleeding Crohn's flare  Minna Antis, MD 04/14/16 2259

## 2016-04-23 ENCOUNTER — Emergency Department
Admission: EM | Admit: 2016-04-23 | Discharge: 2016-04-23 | Disposition: A | Discharge status: Home / Self Care | Payer: Medicaid Other | Attending: Emergency Medicine | Admitting: Emergency Medicine

## 2016-04-23 ENCOUNTER — Encounter: Payer: Self-pay | Admitting: Emergency Medicine

## 2016-04-23 DIAGNOSIS — F17210 Nicotine dependence, cigarettes, uncomplicated: Secondary | ICD-10-CM | POA: Diagnosis not present

## 2016-04-23 DIAGNOSIS — F1290 Cannabis use, unspecified, uncomplicated: Secondary | ICD-10-CM | POA: Insufficient documentation

## 2016-04-23 DIAGNOSIS — Z79899 Other long term (current) drug therapy: Secondary | ICD-10-CM | POA: Insufficient documentation

## 2016-04-23 DIAGNOSIS — R1030 Lower abdominal pain, unspecified: Secondary | ICD-10-CM | POA: Diagnosis present

## 2016-04-23 DIAGNOSIS — R197 Diarrhea, unspecified: Secondary | ICD-10-CM | POA: Diagnosis not present

## 2016-04-23 DIAGNOSIS — N390 Urinary tract infection, site not specified: Secondary | ICD-10-CM

## 2016-04-23 DIAGNOSIS — N3090 Cystitis, unspecified without hematuria: Secondary | ICD-10-CM | POA: Insufficient documentation

## 2016-04-23 DIAGNOSIS — Z7984 Long term (current) use of oral hypoglycemic drugs: Secondary | ICD-10-CM | POA: Insufficient documentation

## 2016-04-23 DIAGNOSIS — E1165 Type 2 diabetes mellitus with hyperglycemia: Secondary | ICD-10-CM | POA: Insufficient documentation

## 2016-04-23 LAB — LIPASE, BLOOD: Lipase: 17 U/L (ref 11–51)

## 2016-04-23 LAB — CBC
HCT: 42.2 % (ref 40.0–52.0)
Hemoglobin: 14.3 g/dL (ref 13.0–18.0)
MCH: 27 pg (ref 26.0–34.0)
MCHC: 34 g/dL (ref 32.0–36.0)
MCV: 79.5 fL — AB (ref 80.0–100.0)
PLATELETS: 152 10*3/uL (ref 150–440)
RBC: 5.31 MIL/uL (ref 4.40–5.90)
RDW: 14.1 % (ref 11.5–14.5)
WBC: 10.2 10*3/uL (ref 3.8–10.6)

## 2016-04-23 LAB — COMPREHENSIVE METABOLIC PANEL
ALK PHOS: 117 U/L (ref 38–126)
ALT: 19 U/L (ref 17–63)
AST: 17 U/L (ref 15–41)
Albumin: 3.8 g/dL (ref 3.5–5.0)
Anion gap: 10 (ref 5–15)
BUN: 11 mg/dL (ref 6–20)
CALCIUM: 9.2 mg/dL (ref 8.9–10.3)
CO2: 27 mmol/L (ref 22–32)
CREATININE: 0.72 mg/dL (ref 0.61–1.24)
Chloride: 96 mmol/L — ABNORMAL LOW (ref 101–111)
GFR calc non Af Amer: >60 mL/min (ref >60)
Glucose, Bld: 554 mg/dL (ref 65–99)
Potassium: 3.8 mmol/L (ref 3.5–5.1)
SODIUM: 133 mmol/L — AB (ref 135–145)
Total Bilirubin: 1.1 mg/dL (ref 0.3–1.2)
Total Protein: 7 g/dL (ref 6.5–8.1)

## 2016-04-23 LAB — URINALYSIS COMPLETE WITH MICROSCOPIC (ARMC ONLY)
BILIRUBIN URINE: NEGATIVE
HGB URINE DIPSTICK: NEGATIVE
Ketones, ur: NEGATIVE mg/dL
Nitrite: NEGATIVE
Protein, ur: NEGATIVE mg/dL
Specific Gravity, Urine: 1.022 (ref 1.005–1.030)
pH: 6 (ref 5.0–8.0)

## 2016-04-23 LAB — GLUCOSE, CAPILLARY
GLUCOSE-CAPILLARY: 301 mg/dL — AB (ref 65–99)
Glucose-Capillary: 504 mg/dL (ref 65–99)

## 2016-04-23 MED ORDER — CEPHALEXIN 500 MG PO CAPS: 500.0000 mg | ORAL_CAPSULE | Freq: Three times a day (TID) | ORAL | Status: DC

## 2016-04-23 MED ORDER — MORPHINE SULFATE (PF) 4 MG/ML IV SOLN
4.0000 mg | Freq: Once | INTRAVENOUS | Status: AC
  Administered 2016-04-23: 4 mg via INTRAVENOUS
  Filled 2016-04-23: qty 1

## 2016-04-23 MED ORDER — ONDANSETRON HCL 4 MG/2ML IJ SOLN
4.0000 mg | Freq: Once | INTRAMUSCULAR | Status: AC
  Administered 2016-04-23: 4 mg via INTRAVENOUS

## 2016-04-23 MED ORDER — INSULIN ASPART 100 UNIT/ML ~~LOC~~ SOLN
10.0000 [IU] | Freq: Once | SUBCUTANEOUS | Status: AC
  Administered 2016-04-23: 10 [IU] via INTRAVENOUS
  Filled 2016-04-23: qty 10

## 2016-04-23 MED ORDER — FENTANYL CITRATE (PF) 100 MCG/2ML IJ SOLN
INTRAMUSCULAR | Status: AC
  Administered 2016-04-23: 100 ug
  Filled 2016-04-23: qty 2

## 2016-04-23 MED ORDER — SODIUM CHLORIDE 0.9 % IV BOLUS (SEPSIS)
1000.0000 mL | Freq: Once | INTRAVENOUS | Status: AC
  Administered 2016-04-23: 1000 mL via INTRAVENOUS

## 2016-04-23 MED ORDER — ONDANSETRON HCL 4 MG/2ML IJ SOLN
4.0000 mg | Freq: Once | INTRAMUSCULAR | Status: DC
  Filled 2016-04-23: qty 2

## 2016-04-23 MED ORDER — ONDANSETRON HCL 4 MG/2ML IJ SOLN
INTRAMUSCULAR | Status: AC
  Administered 2016-04-23: 4 mg
  Filled 2016-04-23: qty 2

## 2016-04-23 MED ORDER — FENTANYL CITRATE (PF) 100 MCG/2ML IJ SOLN: 50.0000 ug | INTRAMUSCULAR | Status: DC | PRN

## 2016-04-23 MED ORDER — DEXTROSE 5 % IV SOLN
1.0000 g | Freq: Once | INTRAVENOUS | Status: AC
  Administered 2016-04-23: 1 g via INTRAVENOUS
  Filled 2016-04-23: qty 10

## 2016-04-23 NOTE — ED Notes (Signed)
Patient ambulated to restroom with assistance.

## 2016-04-23 NOTE — Discharge Instructions (Signed)

## 2016-04-23 NOTE — ED Provider Notes (Addendum)
[facility] Emergency Department Provider Note  Time seen: 2:32 PM  I have reviewed the triage vital signs and the nursing notes.   HISTORY  Chief Complaint Abdominal Pain    HPI Roberto [name] is a 40 y.o. male with a past medical history of diabetes, possible Crohn's disease, who presents the emergency department lower abdominal discomfort and diarrhea. According to the patient for the past several months he has been having fairly frequent diarrhea. He states for the past 3 or 4 days he has been having significant lower abdominal discomfort as well in the mid abdomen. Denies any dysuria or hematuria. Denies any black or bloody stool. Denies any nausea or vomiting.Patient has been seen in the emergency department multiple times by myself over the past one month. Patient seen last 7/7 for abdominal pain and diarrhea at that time a CT scan of the abdomen and pelvis was performed and normal. Patient has his insulin with them although states he has not been using it because he has been having some diarrhea today.    Past Medical History  Diagnosis Date  . Diabetes mellitus   . Crohn disease (HCC)   . PTSD (post-traumatic stress disorder)   . Neuropathy (HCC)   . Sickle cell anemia (HCC)     There are no active problems to display for this patient.   Past Surgical History  Procedure Laterality Date  . Cholecystectomy    . Orchiectomy    . Foot surgery      Current Outpatient Rx  Name  Route  Sig  Dispense  Refill  . citalopram (CELEXA) 40 MG tablet   Oral   Take 40 mg by mouth daily.         Marland Kitchen gabapentin (NEURONTIN) 300 MG capsule   Oral   Take 1 capsule (300 mg total) by mouth 3 (three) times daily.   90 capsule   1   . ibuprofen (ADVIL,MOTRIN) 600 MG tablet   Oral   Take 600 mg by mouth every 6 (six) hours as needed. For pain         . insulin NPH-insulin regular (NOVOLIN 70/30) (70-30) 100 UNIT/ML injection   Subcutaneous   Inject 75  Units into the skin 2 (two) times daily.   10 mL   2   . mesalamine (ASACOL) 400 MG EC tablet   Oral   Take 800 mg by mouth 3 (three) times daily.         . metFORMIN (GLUCOPHAGE) 1000 MG tablet   Oral   Take 1 tablet (1,000 mg total) by mouth 2 (two) times daily with a meal.   60 tablet   1   . oxyCODONE (ROXICODONE) 5 MG immediate release tablet   Oral   Take 1 tablet (5 mg total) by mouth every 8 (eight) hours as needed.   15 tablet   0   . predniSONE (DELTASONE) 20 MG tablet   Oral   Take 2 tablets (40 mg total) by mouth daily.   10 tablet   0   . traMADol (ULTRAM) 50 MG tablet   Oral   Take 1 tablet (50 mg total) by mouth every 6 (six) hours as needed.   15 tablet   0     Allergies Cherry; Acetaminophen; and Codeine  Family History  Problem Relation Age of Onset  . Diabetes Mother   . Diabetes Father     Social History Social History  Substance Use Topics  .  Smoking status: Current Every Day Smoker -- 0.50 packs/day    Types: Cigarettes  . Smokeless tobacco: None  . Alcohol Use: No    Review of Systems Constitutional: Negative for fever Cardiovascular: Negative for chest pain. Respiratory: Negative for shortness of breath. Gastrointestinal: Lower abdominal pain. Negative for nausea and vomiting. Positive for diarrhea. Genitourinary: Negative for dysuria. Negative for hematuria Musculoskeletal: Negative for back pain. Neurological: Negative for headache 10-point ROS otherwise negative.  ____________________________________________   PHYSICAL EXAM:  VITAL SIGNS: ED Triage Vitals  Enc Vitals Group     BP 04/23/16 1247 140/90 mmHg     Pulse Rate 04/23/16 1247 112     Resp 04/23/16 1247 20     Temp 04/23/16 1247 98.3 F (36.8 C)     Temp Source 04/23/16 1247 Oral     SpO2 04/23/16 1247 100 %     Weight 04/23/16 1247 189 lb (85.73 kg)     Height 04/23/16 1247 5\' 10"  (1.778 m)     Head Cir --      Peak Flow --      Pain Score 04/23/16  1247 10     Pain Loc --      Pain Edu? --      Excl. in GC? --     Constitutional: Alert and oriented. Well appearing and in no distress. Eyes: Normal exam ENT   Head: Normocephalic and atraumatic.   Mouth/Throat: Mucous membranes are moist. Cardiovascular: Normal rate, regular rhythm. No murmur Respiratory: Normal respiratory effort without tachypnea nor retractions. Breath sounds are clear Gastrointestinal: Soft, moderate suprapubic tenderness to palpation. No rebound or guarding. No distention. Musculoskeletal: Nontender with normal range of motion in all extremities. Neurologic:  Normal speech and language. No gross focal neurologic deficits Skin:  Skin is warm, dry and intact.  Psychiatric: Mood and affect are normal.  ____________________________________________   INITIAL IMPRESSION / ASSESSMENT AND PLAN / ED COURSE  Pertinent labs & imaging results that were available during my care of the patient were reviewed by me and considered in my medical decision making (see chart for details).  The patient presents the emergency department lower abdominal pain for the past several days along with diarrhea which is been ongoing for several weeks. I performed a CT scan on the patient one week ago with normal results. Patient's labs today show too numerous count white blood cells. His urine. He denies any penile discharge. Patient has moderate suprapubic tenderness to palpation. Suspect urinary tract infection causing cystitis. We will treat with IV Rocephin, pain and nausea medication, IV fluids and close to monitor in the emergency department. Given the patient's frequent loose stool have also added on C. difficile as well as stool culture. Patient's blood sugars 554 with a normal anion gap. We'll dose IV insulin in addition to the fluids and close to monitor.  Patient's blood glucose has decreased to 300. He has not doses of insulin today. Patient unable to provide a stool sample in  the emergency department. We will discharge the patient on Keflex for presumed urinary tract infection. The patient has a follow-up appointment he states with a primary care doctor next week. I discussed with the patient importance of taking his metformin twice daily as well as sliding scale NovoLog. Patient agrees, also discussed increasing his oral fluids and dietary control. Patient states he is already doing so.   EKG reviewed and interpreted by myself shows sinus tachycardia 135 bpm, normal axis, normal intervals, nonspecific ST changes.  ____________________________________________   FINAL CLINICAL IMPRESSION(S) / ED DIAGNOSES  Cystitis Abdominal pain Hyperglycemia Urinary tract infection  Minna Antis, MD 04/23/16 1706  Minna Antis, MD 05/08/16 1520

## 2016-04-23 NOTE — ED Notes (Signed)
Dr. Lenard Lance notified that patients glucose is 554, verbal order given for 1 liter NS bolus

## 2016-04-23 NOTE — ED Notes (Signed)
Here 7/7 for abd pain.  It is getting worse now.

## 2016-04-25 LAB — URINE CULTURE: Culture: 1000 — AB

## 2016-05-08 ENCOUNTER — Ambulatory Visit: Payer: Medicaid Other

## 2016-05-13 ENCOUNTER — Encounter: Payer: Self-pay | Admitting: Emergency Medicine

## 2016-05-13 ENCOUNTER — Emergency Department
Admission: EM | Admit: 2016-05-13 | Discharge: 2016-05-13 | Disposition: A | Discharge status: Home / Self Care | Payer: Medicaid Other | Attending: Emergency Medicine | Admitting: Emergency Medicine

## 2016-05-13 ENCOUNTER — Emergency Department: Payer: Medicaid Other

## 2016-05-13 DIAGNOSIS — Z7984 Long term (current) use of oral hypoglycemic drugs: Secondary | ICD-10-CM | POA: Diagnosis not present

## 2016-05-13 DIAGNOSIS — F17210 Nicotine dependence, cigarettes, uncomplicated: Secondary | ICD-10-CM | POA: Insufficient documentation

## 2016-05-13 DIAGNOSIS — R1084 Generalized abdominal pain: Secondary | ICD-10-CM | POA: Diagnosis not present

## 2016-05-13 DIAGNOSIS — Z79899 Other long term (current) drug therapy: Secondary | ICD-10-CM | POA: Diagnosis not present

## 2016-05-13 DIAGNOSIS — E1165 Type 2 diabetes mellitus with hyperglycemia: Secondary | ICD-10-CM | POA: Insufficient documentation

## 2016-05-13 DIAGNOSIS — Z794 Long term (current) use of insulin: Secondary | ICD-10-CM | POA: Insufficient documentation

## 2016-05-13 DIAGNOSIS — F1290 Cannabis use, unspecified, uncomplicated: Secondary | ICD-10-CM | POA: Insufficient documentation

## 2016-05-13 DIAGNOSIS — R739 Hyperglycemia, unspecified: Secondary | ICD-10-CM

## 2016-05-13 DIAGNOSIS — K625 Hemorrhage of anus and rectum: Secondary | ICD-10-CM | POA: Diagnosis present

## 2016-05-13 LAB — URINALYSIS COMPLETE WITH MICROSCOPIC (ARMC ONLY)
BACTERIA UA: NONE SEEN
BILIRUBIN URINE: NEGATIVE
Glucose, UA: >500 mg/dL — AB
HGB URINE DIPSTICK: NEGATIVE
Ketones, ur: NEGATIVE mg/dL
NITRITE: NEGATIVE
PH: 6 (ref 5.0–8.0)
PROTEIN: NEGATIVE mg/dL
Specific Gravity, Urine: 1.024 (ref 1.005–1.030)

## 2016-05-13 LAB — CBC
HEMATOCRIT: 43.4 % (ref 40.0–52.0)
HEMOGLOBIN: 15 g/dL (ref 13.0–18.0)
MCH: 27.8 pg (ref 26.0–34.0)
MCHC: 34.6 g/dL (ref 32.0–36.0)
MCV: 80.3 fL (ref 80.0–100.0)
Platelets: 147 10*3/uL — ABNORMAL LOW (ref 150–440)
RBC: 5.41 MIL/uL (ref 4.40–5.90)
RDW: 13.1 % (ref 11.5–14.5)
WBC: 10.7 10*3/uL — AB (ref 3.8–10.6)

## 2016-05-13 LAB — COMPREHENSIVE METABOLIC PANEL
ALBUMIN: 4.2 g/dL (ref 3.5–5.0)
ALT: 14 U/L — ABNORMAL LOW (ref 17–63)
ANION GAP: 11 (ref 5–15)
AST: 17 U/L (ref 15–41)
Alkaline Phosphatase: 109 U/L (ref 38–126)
BUN: 7 mg/dL (ref 6–20)
CHLORIDE: 93 mmol/L — AB (ref 101–111)
CO2: 29 mmol/L (ref 22–32)
Calcium: 9.2 mg/dL (ref 8.9–10.3)
Creatinine, Ser: 0.85 mg/dL (ref 0.61–1.24)
GFR calc Af Amer: >60 mL/min (ref >60)
GLUCOSE: 558 mg/dL — AB (ref 65–99)
POTASSIUM: 4.1 mmol/L (ref 3.5–5.1)
Sodium: 133 mmol/L — ABNORMAL LOW (ref 135–145)
TOTAL PROTEIN: 7.6 g/dL (ref 6.5–8.1)
Total Bilirubin: 0.7 mg/dL (ref 0.3–1.2)

## 2016-05-13 LAB — LIPASE, BLOOD: LIPASE: 16 U/L (ref 11–51)

## 2016-05-13 LAB — GLUCOSE, CAPILLARY: GLUCOSE-CAPILLARY: 420 mg/dL — AB (ref 65–99)

## 2016-05-13 MED ORDER — IOPAMIDOL (ISOVUE-300) INJECTION 61%
100.0000 mL | Freq: Once | INTRAVENOUS | Status: AC | PRN
Start: 2016-05-13 — End: 2016-05-13
  Administered 2016-05-13: 100 mL via INTRAVENOUS

## 2016-05-13 MED ORDER — PREDNISONE 10 MG (21) PO TBPK: ORAL_TABLET | ORAL | 0 refills | Status: DC

## 2016-05-13 MED ORDER — INSULIN ASPART 100 UNIT/ML ~~LOC~~ SOLN
5.0000 [IU] | Freq: Once | SUBCUTANEOUS | Status: AC
  Administered 2016-05-13: 5 [IU] via SUBCUTANEOUS
  Filled 2016-05-13: qty 5

## 2016-05-13 MED ORDER — DIATRIZOATE MEGLUMINE & SODIUM 66-10 % PO SOLN
15.0000 mL | ORAL | Status: AC
  Administered 2016-05-13: 15 mL via ORAL

## 2016-05-13 MED ORDER — INSULIN ASPART 100 UNIT/ML ~~LOC~~ SOLN
10.0000 [IU] | Freq: Once | SUBCUTANEOUS | Status: AC
  Administered 2016-05-13: 10 [IU] via SUBCUTANEOUS
  Filled 2016-05-13: qty 10

## 2016-05-13 MED ORDER — ONDANSETRON HCL 4 MG/2ML IJ SOLN
4.0000 mg | Freq: Once | INTRAMUSCULAR | Status: AC
  Administered 2016-05-13: 4 mg via INTRAVENOUS
  Filled 2016-05-13: qty 2

## 2016-05-13 MED ORDER — SODIUM CHLORIDE 0.9 % IV BOLUS (SEPSIS)
1000.0000 mL | Freq: Once | INTRAVENOUS | Status: AC
  Administered 2016-05-13: 1000 mL via INTRAVENOUS

## 2016-05-13 MED ORDER — MORPHINE SULFATE (PF) 4 MG/ML IV SOLN: 4.0000 mg | Freq: Once | INTRAVENOUS | Status: DC

## 2016-05-13 MED ORDER — HYDROMORPHONE HCL 1 MG/ML IJ SOLN
1.0000 mg | Freq: Once | INTRAMUSCULAR | Status: AC
  Administered 2016-05-13: 1 mg via INTRAVENOUS
  Filled 2016-05-13: qty 1

## 2016-05-13 NOTE — ED Triage Notes (Signed)
States abdominal pain x 2 months, states rectal bleeding x 1 month.

## 2016-05-13 NOTE — ED Provider Notes (Signed)
IMPRESSION: 1. No acute findings identified in the abdomen or pelvis. No discrete inflammatory changes are seen in association with the gastrointestinal tract. Negative for bowel obstruction. 2. 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 These bony changes can be associated with inflammatory bowel disease. 3. Bilateral gynecomastia.  Patient is in no acute distress, hyperglycemia has improved. He is stable for outpatient follow-up.    Emily Filbert, MD 05/13/16 2119

## 2016-05-13 NOTE — ED Provider Notes (Signed)
[facility] Emergency Department Provider Note   ____________________________________________   First MD Initiated Contact with Patient 05/13/16 1735     (approximate)  I have reviewed the triage vital signs and the nursing notes.   HISTORY  Chief Complaint Abdominal Pain and Rectal Bleeding   HPI Roberto [name] is a 40 y.o. male with a history of Crohn's disease and diabetes who is presenting for right lower quadrant abdominal pain as well as bloody stools. He says that the pain is been persistent since the last time he was here which was on July 16. At that time he was diagnosed with urinary tract infection. He was discharged with Percocet as well as Keflex. Since then he says the pain is been persistent. He says that his pain is now 10 out of 10. He denies any nausea or vomiting. Denies any burning with urination. Says that he has not taken any medication for Crohn's at this time. He is taking metformin for his diabetes. Says that the pain is sharp and constant.Denies any radiation of the pain. He says that he has been diagnosed with Crohn's and then was told that he did not have Crohn's after that. However, he says that he doesn't a family history of Crohn's.   Past Medical History:  Diagnosis Date  . Crohn disease (HCC)   . Diabetes mellitus   . Neuropathy (HCC)   . PTSD (post-traumatic stress disorder)   . Sickle cell anemia (HCC)     There are no active problems to display for this patient.   Past Surgical History:  Procedure Laterality Date  . CHOLECYSTECTOMY    . FOOT SURGERY    . ORCHIECTOMY      Prior to Admission medications   Medication Sig Start Date End Date Taking? Authorizing Provider  gabapentin (NEURONTIN) 800 MG tablet Take 800 mg by mouth 3 (three) times daily.   Yes Historical Provider, MD  insulin aspart (NOVOLOG) 100 UNIT/ML injection Inject into the skin 3 (three) times daily with meals. Inject subcutaneous 3 times a day  with meal per sliding scale as directed for blood sugar: 0-149= 0 units 150-200= 2 units 201-250= 4 units 251-300= 6 units,  301-350= 8 units, 351-400= 10 units 401 or greater=come to emergency department   Yes Historical Provider, MD  insulin glargine (LANTUS) 100 UNIT/ML injection Inject 28 Units into the skin at bedtime.   Yes Historical Provider, MD  metFORMIN (GLUCOPHAGE) 1000 MG tablet Take 1 tablet (1,000 mg total) by mouth 2 (two) times daily with a meal. 04/13/16  Yes Minna Antis, MD  traMADol (ULTRAM) 50 MG tablet Take 1 tablet (50 mg total) by mouth every 6 (six) hours as needed. 04/13/16 04/13/17 Yes Minna Antis, MD  cephALEXin (KEFLEX) 500 MG capsule Take 1 capsule (500 mg total) by mouth 3 (three) times daily. 04/23/16   Minna Antis, MD  gabapentin (NEURONTIN) 300 MG capsule Take 1 capsule (300 mg total) by mouth 3 (three) times daily. Patient not taking: Reported on 05/13/2016 04/13/16 04/13/17  Minna Antis, MD  insulin NPH-insulin regular (NOVOLIN 70/30) (70-30) 100 UNIT/ML injection Inject 75 Units into the skin 2 (two) times daily. 04/08/12   Eber Hong, MD  oxyCODONE (ROXICODONE) 5 MG immediate release tablet Take 1 tablet (5 mg total) by mouth every 8 (eight) hours as needed. 04/14/16 04/14/17  Minna Antis, MD  predniSONE (DELTASONE) 20 MG tablet Take 2 tablets (40 mg total) by mouth daily. 04/14/16   Minna Antis,  MD    Allergies Cherry  Family History  Problem Relation Age of Onset  . Diabetes Mother   . Diabetes Father     Social History Social History  Substance Use Topics  . Smoking status: Current Every Day Smoker    Packs/day: 0.50    Types: Cigarettes  . Smokeless tobacco: Not on file  . Alcohol use No    Review of Systems Constitutional: No fever/chills Eyes: No visual changes. ENT: No sore throat. Cardiovascular: Denies chest pain. Respiratory: Denies shortness of breath. Gastrointestinal: No nausea, no vomiting.  No diarrhea.   No constipation. Genitourinary: Negative for dysuria. Musculoskeletal: Negative for back pain. Skin: Negative for rash. Neurological: Negative for headaches, focal weakness or numbness.  10-point ROS otherwise negative.  ____________________________________________   PHYSICAL EXAM:  VITAL SIGNS: ED Triage Vitals [05/13/16 1714]  Enc Vitals Group     BP (!) 127/95     Pulse Rate 91     Resp 18     Temp 98.4 F (36.9 C)     Temp Source Oral     SpO2 99 %     Weight 175 lb (79.4 kg)     Height 5\' 10"  (1.778 m)     Head Circumference      Peak Flow      Pain Score 8     Pain Loc      Pain Edu?      Excl. in GC?     Constitutional: Alert and oriented. Tearful. Crying through the exam. Eyes: Conjunctivae are normal. PERRL. EOMI. Head: Atraumatic. Nose: No congestion/rhinnorhea. Mouth/Throat: Mucous membranes are moist.  Oropharynx non-erythematous. Neck: No stridor.   Cardiovascular: Tachycardic, regular rhythm. Grossly normal heart sounds.  Good peripheral circulation. Respiratory: Normal respiratory effort.  No retractions. Lungs CTAB. Gastrointestinal: Soft with moderate right upper quadrant as well as right lower quadrant tenderness palpation. No distention.  No CVA tenderness. External exam of the rectum without any obvious fistulas. Digital exam with brown stool grossly which is heme-negative.  Musculoskeletal: No lower extremity tenderness nor edema.  No joint effusions. Neurologic:  Normal speech and language. No gross focal neurologic deficits are appreciated. No gait instability. Skin:  Skin is warm, dry and intact. No rash noted. Psychiatric: Mood and affect are normal. Speech and behavior are normal.  ____________________________________________   LABS (all labs ordered are listed, but only abnormal results are displayed)  Labs Reviewed  COMPREHENSIVE METABOLIC PANEL - Abnormal; Notable for the following:       Result Value   Sodium 133 (*)    Chloride 93  (*)    Glucose, Bld 558 (*)    ALT 14 (*)    All other components within normal limits  CBC - Abnormal; Notable for the following:    WBC 10.7 (*)    Platelets 147 (*)    All other components within normal limits  URINALYSIS COMPLETEWITH MICROSCOPIC ([facility] ONLY) - Abnormal; Notable for the following:    Color, Urine STRAW (*)    APPearance CLEAR (*)    Glucose, UA >500 (*)    Leukocytes, UA TRACE (*)    Squamous Epithelial / LPF 0-5 (*)    All other components within normal limits  LIPASE, BLOOD  CBG MONITORING, ED   ____________________________________________  EKG   ____________________________________________  RADIOLOGY  Pending CAT scan of the abdomen and pelvis at this time. ____________________________________________   PROCEDURES  Procedure(s) performed:   Procedures  Critical Care performed:  ____________________________________________   INITIAL IMPRESSION / ASSESSMENT AND PLAN / ED COURSE  Pertinent labs & imaging results that were available during my care of the patient were reviewed by me and considered in my medical decision making (see chart for details).  ----------------------------------------- 845 PM on 05/13/2016 -----------------------------------------  Today patient with IV fluids as well as pain medications. Patient with unclear history of Crohn's disease. Also with hyperglycemia. Signed out to Dr. Mayford Knife will be following up with the patient's CAT scan as well as glucose.  Clinical Course     ____________________________________________   FINAL CLINICAL IMPRESSION(S) / ED DIAGNOSES  Final diagnoses:  Generalized abdominal pain  Hyperglycemia      NEW MEDICATIONS STARTED DURING THIS VISIT:  New Prescriptions   No medications on file     Note:  This document was prepared using Dragon voice recognition software and may include unintentional dictation errors.    Myrna Blazer, MD 05/13/16 2127

## 2016-05-31 ENCOUNTER — Encounter: Payer: Self-pay | Admitting: Emergency Medicine

## 2016-05-31 ENCOUNTER — Inpatient Hospital Stay
Admission: EM | Admit: 2016-05-31 | Discharge: 2016-06-03 | DRG: 638 | Disposition: A | Discharge status: Home / Self Care | Payer: Medicaid Other | Attending: Internal Medicine | Admitting: Internal Medicine

## 2016-05-31 ENCOUNTER — Emergency Department: Payer: Medicaid Other

## 2016-05-31 DIAGNOSIS — D571 Sickle-cell disease without crisis: Secondary | ICD-10-CM | POA: Diagnosis present

## 2016-05-31 DIAGNOSIS — Z9889 Other specified postprocedural states: Secondary | ICD-10-CM

## 2016-05-31 DIAGNOSIS — N390 Urinary tract infection, site not specified: Secondary | ICD-10-CM | POA: Diagnosis present

## 2016-05-31 DIAGNOSIS — N2889 Other specified disorders of kidney and ureter: Secondary | ICD-10-CM | POA: Diagnosis present

## 2016-05-31 DIAGNOSIS — Z9111 Patient's noncompliance with dietary regimen: Secondary | ICD-10-CM | POA: Diagnosis not present

## 2016-05-31 DIAGNOSIS — G629 Polyneuropathy, unspecified: Secondary | ICD-10-CM | POA: Diagnosis present

## 2016-05-31 DIAGNOSIS — Z79899 Other long term (current) drug therapy: Secondary | ICD-10-CM | POA: Diagnosis not present

## 2016-05-31 DIAGNOSIS — Z9119 Patient's noncompliance with other medical treatment and regimen: Secondary | ICD-10-CM

## 2016-05-31 DIAGNOSIS — E876 Hypokalemia: Secondary | ICD-10-CM | POA: Diagnosis present

## 2016-05-31 DIAGNOSIS — Z9114 Patient's other noncompliance with medication regimen: Secondary | ICD-10-CM | POA: Diagnosis not present

## 2016-05-31 DIAGNOSIS — E291 Testicular hypofunction: Secondary | ICD-10-CM | POA: Diagnosis present

## 2016-05-31 DIAGNOSIS — R739 Hyperglycemia, unspecified: Secondary | ICD-10-CM

## 2016-05-31 DIAGNOSIS — R1084 Generalized abdominal pain: Secondary | ICD-10-CM

## 2016-05-31 DIAGNOSIS — Z794 Long term (current) use of insulin: Secondary | ICD-10-CM | POA: Diagnosis not present

## 2016-05-31 DIAGNOSIS — F17210 Nicotine dependence, cigarettes, uncomplicated: Secondary | ICD-10-CM | POA: Diagnosis present

## 2016-05-31 DIAGNOSIS — E1100 Type 2 diabetes mellitus with hyperosmolarity without nonketotic hyperglycemic-hyperosmolar coma (NKHHC): Principal | ICD-10-CM | POA: Diagnosis present

## 2016-05-31 DIAGNOSIS — Z9049 Acquired absence of other specified parts of digestive tract: Secondary | ICD-10-CM | POA: Diagnosis not present

## 2016-05-31 DIAGNOSIS — K5090 Crohn's disease, unspecified, without complications: Secondary | ICD-10-CM | POA: Diagnosis present

## 2016-05-31 DIAGNOSIS — R109 Unspecified abdominal pain: Secondary | ICD-10-CM

## 2016-05-31 DIAGNOSIS — R34 Anuria and oliguria: Secondary | ICD-10-CM

## 2016-05-31 DIAGNOSIS — Z833 Family history of diabetes mellitus: Secondary | ICD-10-CM

## 2016-05-31 DIAGNOSIS — N289 Disorder of kidney and ureter, unspecified: Secondary | ICD-10-CM

## 2016-05-31 LAB — CBC WITH DIFFERENTIAL/PLATELET
BASOS ABS: 0.1 10*3/uL (ref 0–0.1)
Basophils Relative: 1 %
EOS PCT: 1 %
Eosinophils Absolute: 0.1 10*3/uL (ref 0–0.7)
HEMATOCRIT: 43 % (ref 40.0–52.0)
Hemoglobin: 14.5 g/dL (ref 13.0–18.0)
LYMPHS ABS: 2 10*3/uL (ref 1.0–3.6)
LYMPHS PCT: 18 %
MCH: 26.9 pg (ref 26.0–34.0)
MCHC: 33.8 g/dL (ref 32.0–36.0)
MCV: 79.5 fL — AB (ref 80.0–100.0)
MONO ABS: 0.8 10*3/uL (ref 0.2–1.0)
MONOS PCT: 7 %
NEUTROS ABS: 8.5 10*3/uL — AB (ref 1.4–6.5)
Neutrophils Relative %: 73 %
Platelets: 121 10*3/uL — ABNORMAL LOW (ref 150–440)
RBC: 5.41 MIL/uL (ref 4.40–5.90)
RDW: 12.8 % (ref 11.5–14.5)
WBC: 11.5 10*3/uL — ABNORMAL HIGH (ref 3.8–10.6)

## 2016-05-31 LAB — COMPREHENSIVE METABOLIC PANEL
ALT: 17 U/L (ref 17–63)
ANION GAP: 14 (ref 5–15)
AST: 15 U/L (ref 15–41)
Albumin: 3.8 g/dL (ref 3.5–5.0)
Alkaline Phosphatase: 101 U/L (ref 38–126)
BUN: 8 mg/dL (ref 6–20)
CHLORIDE: 94 mmol/L — AB (ref 101–111)
CO2: 24 mmol/L (ref 22–32)
Calcium: 9 mg/dL (ref 8.9–10.3)
Creatinine, Ser: 0.83 mg/dL (ref 0.61–1.24)
GFR calc non Af Amer: >60 mL/min (ref >60)
Glucose, Bld: 707 mg/dL (ref 65–99)
POTASSIUM: 2.8 mmol/L — AB (ref 3.5–5.1)
SODIUM: 132 mmol/L — AB (ref 135–145)
Total Bilirubin: 1.1 mg/dL (ref 0.3–1.2)
Total Protein: 7.1 g/dL (ref 6.5–8.1)

## 2016-05-31 LAB — GLUCOSE, CAPILLARY
GLUCOSE-CAPILLARY: 113 mg/dL — AB (ref 65–99)
GLUCOSE-CAPILLARY: 278 mg/dL — AB (ref 65–99)
GLUCOSE-CAPILLARY: 458 mg/dL — AB (ref 65–99)
GLUCOSE-CAPILLARY: 528 mg/dL — AB (ref 65–99)
Glucose-Capillary: 528 mg/dL (ref 65–99)

## 2016-05-31 LAB — LIPASE, BLOOD: LIPASE: 17 U/L (ref 11–51)

## 2016-05-31 LAB — MRSA PCR SCREENING: MRSA by PCR: NEGATIVE

## 2016-05-31 MED ORDER — PNEUMOCOCCAL VAC POLYVALENT 25 MCG/0.5ML IJ INJ
0.5000 mL | INJECTION | INTRAMUSCULAR | Status: AC
  Administered 2016-06-01: 0.5 mL via INTRAMUSCULAR
  Filled 2016-05-31: qty 0.5

## 2016-05-31 MED ORDER — ACETAMINOPHEN 650 MG RE SUPP: 650.0000 mg | Freq: Four times a day (QID) | RECTAL | Status: DC | PRN

## 2016-05-31 MED ORDER — ENOXAPARIN SODIUM 40 MG/0.4ML ~~LOC~~ SOLN
40.0000 mg | SUBCUTANEOUS | Status: DC
  Administered 2016-05-31 – 2016-06-01 (×2): 40 mg via SUBCUTANEOUS
  Filled 2016-05-31 (×3): qty 0.4

## 2016-05-31 MED ORDER — SODIUM CHLORIDE 0.9 % IV SOLN: INTRAVENOUS | Status: DC

## 2016-05-31 MED ORDER — INSULIN ASPART 100 UNIT/ML ~~LOC~~ SOLN
5.0000 [IU] | Freq: Once | SUBCUTANEOUS | Status: AC
  Administered 2016-05-31: 5 [IU] via SUBCUTANEOUS
  Filled 2016-05-31: qty 5

## 2016-05-31 MED ORDER — INSULIN ASPART 100 UNIT/ML ~~LOC~~ SOLN
0.0000 [IU] | Freq: Three times a day (TID) | SUBCUTANEOUS | Status: DC
  Administered 2016-05-31: 20 [IU] via SUBCUTANEOUS
  Administered 2016-06-01: 4 [IU] via SUBCUTANEOUS
  Administered 2016-06-01 (×2): 7 [IU] via SUBCUTANEOUS
  Administered 2016-06-02 (×3): 11 [IU] via SUBCUTANEOUS
  Administered 2016-06-03 (×2): 7 [IU] via SUBCUTANEOUS
  Filled 2016-05-31: qty 20
  Filled 2016-05-31: qty 11
  Filled 2016-05-31 (×4): qty 7
  Filled 2016-05-31: qty 11
  Filled 2016-05-31: qty 4
  Filled 2016-05-31: qty 11
  Filled 2016-05-31: qty 1

## 2016-05-31 MED ORDER — INSULIN ASPART 100 UNIT/ML ~~LOC~~ SOLN
10.0000 [IU] | Freq: Once | SUBCUTANEOUS | Status: AC
  Administered 2016-05-31: 10 [IU] via INTRAVENOUS
  Filled 2016-05-31: qty 10

## 2016-05-31 MED ORDER — POTASSIUM CHLORIDE IN NACL 20-0.9 MEQ/L-% IV SOLN
INTRAVENOUS | Status: DC
  Administered 2016-05-31 – 2016-06-03 (×8): via INTRAVENOUS
  Filled 2016-05-31 (×12): qty 1000

## 2016-05-31 MED ORDER — GABAPENTIN 400 MG PO CAPS
800.0000 mg | ORAL_CAPSULE | Freq: Three times a day (TID) | ORAL | Status: DC
  Administered 2016-05-31 – 2016-06-03 (×9): 800 mg via ORAL
  Filled 2016-05-31 (×9): qty 2

## 2016-05-31 MED ORDER — KETOROLAC TROMETHAMINE 30 MG/ML IJ SOLN
30.0000 mg | Freq: Four times a day (QID) | INTRAMUSCULAR | Status: DC | PRN
  Administered 2016-05-31 – 2016-06-02 (×5): 30 mg via INTRAVENOUS
  Filled 2016-05-31 (×5): qty 1

## 2016-05-31 MED ORDER — OXYCODONE HCL 5 MG PO TABS
5.0000 mg | ORAL_TABLET | ORAL | Status: DC | PRN
  Administered 2016-05-31 – 2016-06-03 (×3): 5 mg via ORAL
  Filled 2016-05-31 (×3): qty 1

## 2016-05-31 MED ORDER — DICYCLOMINE HCL 10 MG PO CAPS
10.0000 mg | ORAL_CAPSULE | Freq: Three times a day (TID) | ORAL | Status: DC
  Administered 2016-05-31 – 2016-06-03 (×12): 10 mg via ORAL
  Filled 2016-05-31 (×12): qty 1

## 2016-05-31 MED ORDER — METFORMIN HCL 500 MG PO TABS
1000.0000 mg | ORAL_TABLET | Freq: Two times a day (BID) | ORAL | Status: DC
  Administered 2016-05-31 – 2016-06-01 (×3): 1000 mg via ORAL
  Filled 2016-05-31 (×3): qty 2

## 2016-05-31 MED ORDER — ACETAMINOPHEN 325 MG PO TABS: 650.0000 mg | ORAL_TABLET | Freq: Four times a day (QID) | ORAL | Status: DC | PRN

## 2016-05-31 MED ORDER — KETOROLAC TROMETHAMINE 60 MG/2ML IM SOLN
60.0000 mg | Freq: Once | INTRAMUSCULAR | Status: AC
  Administered 2016-05-31: 60 mg via INTRAMUSCULAR
  Filled 2016-05-31: qty 2

## 2016-05-31 MED ORDER — INSULIN ASPART 100 UNIT/ML ~~LOC~~ SOLN: 0.0000 [IU] | Freq: Every day | SUBCUTANEOUS | Status: DC

## 2016-05-31 MED ORDER — PROMETHAZINE HCL 25 MG PO TABS
50.0000 mg | ORAL_TABLET | Freq: Once | ORAL | Status: AC
  Administered 2016-05-31: 50 mg via ORAL
  Filled 2016-05-31: qty 2

## 2016-05-31 MED ORDER — INSULIN GLARGINE 100 UNIT/ML ~~LOC~~ SOLN
28.0000 [IU] | Freq: Every day | SUBCUTANEOUS | Status: DC
  Filled 2016-05-31 (×2): qty 0.28

## 2016-05-31 MED ORDER — SODIUM CHLORIDE 0.9 % IV SOLN
Freq: Once | INTRAVENOUS | Status: AC
  Administered 2016-05-31: 1000 mL via INTRAVENOUS

## 2016-05-31 NOTE — H&P (Signed)
[facility] - [place] at [facility]   PATIENT NAME: Roberto [name]    MR#:  [id number]  DATE OF BIRTH:  [birth year]   DATE OF ADMISSION:  05/31/2016  PRIMARY CARE PHYSICIAN: Derwood Kaplan, MD   REQUESTING/REFERRING PHYSICIAN: Mayford Knife  CHIEF COMPLAINT:   Chief Complaint  Patient presents with  . Abdominal Pain    HISTORY OF PRESENT ILLNESS:  Roberto [name]  is a 40 y.o. male with a known history of Type 2 diabetes who is presenting with abdominal pain. He has ongoing symptoms of abdominal pain going back of hand full of months he states been worse over the last few days. He describes his abdominal pain 10/10 intensity worsening with food/drink, some relief after applying pressure. Associated nausea denies any diarrhea or constipation. States after he eats typically has a bowel movement quickly within 10 minutes. Denies fevers chills further symptomatology as above.  Does however describe increased thirst  PAST MEDICAL HISTORY:   Past Medical History:  Diagnosis Date  . Crohn disease (HCC)   . Diabetes mellitus   . Neuropathy (HCC)   . PTSD (post-traumatic stress disorder)   . Sickle cell anemia (HCC)     PAST SURGICAL HISTORY:   Past Surgical History:  Procedure Laterality Date  . CHOLECYSTECTOMY    . FOOT SURGERY    . ORCHIECTOMY      SOCIAL HISTORY:   Social History  Substance Use Topics  . Smoking status: Current Every Day Smoker    Packs/day: 0.50    Types: Cigarettes  . Smokeless tobacco: Never Used  . Alcohol use No    FAMILY HISTORY:   Family History  Problem Relation Age of Onset  . Diabetes Mother   . Diabetes Father     DRUG ALLERGIES:   Allergies  Allergen Reactions  . Cherry Shortness Of Breath and Swelling    Facial swelling/SOB    REVIEW OF SYSTEMS:  REVIEW OF SYSTEMS:  CONSTITUTIONAL: Denies fevers, chills, fatigue, weakness.  EYES: Denies blurred vision, double vision, or eye pain.  EARS, NOSE, THROAT:  Denies tinnitus, ear pain, hearing loss.  RESPIRATORY: denies cough, shortness of breath, wheezing  CARDIOVASCULAR: Denies chest pain, palpitations, edema.  GASTROINTESTINAL: Positive nausea, vomiting, , abdominal pain.  GENITOURINARY: Denies dysuria, hematuria.  ENDOCRINE: Denies nocturia or thyroid problems. HEMATOLOGIC AND LYMPHATIC: Denies easy bruising or bleeding.  SKIN: Denies rash or lesions.  MUSCULOSKELETAL: Denies pain in neck, back, shoulder, knees, hips, or further arthritic symptoms.  NEUROLOGIC: Denies paralysis, paresthesias.  PSYCHIATRIC: Denies anxiety or depressive symptoms. Otherwise full review of systems performed by me is negative.   MEDICATIONS AT HOME:   Prior to Admission medications   Medication Sig Start Date End Date Taking? Authorizing Provider  cephALEXin (KEFLEX) 500 MG capsule Take 1 capsule (500 mg total) by mouth 3 (three) times daily. 04/23/16   Minna Antis, MD  gabapentin (NEURONTIN) 300 MG capsule Take 1 capsule (300 mg total) by mouth 3 (three) times daily. Patient not taking: Reported on 05/13/2016 04/13/16 04/13/17  Minna Antis, MD  gabapentin (NEURONTIN) 800 MG tablet Take 800 mg by mouth 3 (three) times daily.    Historical Provider, MD  insulin aspart (NOVOLOG) 100 UNIT/ML injection Inject into the skin 3 (three) times daily with meals. Inject subcutaneous 3 times a day with meal per sliding scale as directed for blood sugar: 0-149= 0 units 150-200= 2 units 201-250= 4 units 251-300= 6 units,  301-350= 8 units, 351-400= 10 units  401 or greater=come to emergency department    Historical Provider, MD  insulin glargine (LANTUS) 100 UNIT/ML injection Inject 28 Units into the skin at bedtime.    Historical Provider, MD  insulin NPH-insulin regular (NOVOLIN 70/30) (70-30) 100 UNIT/ML injection Inject 75 Units into the skin 2 (two) times daily. 04/08/12   Eber Hong, MD  metFORMIN (GLUCOPHAGE) 1000 MG tablet Take 1 tablet (1,000 mg total) by  mouth 2 (two) times daily with a meal. 04/13/16   Minna Antis, MD  oxyCODONE (ROXICODONE) 5 MG immediate release tablet Take 1 tablet (5 mg total) by mouth every 8 (eight) hours as needed. 04/14/16 04/14/17  Minna Antis, MD  predniSONE (DELTASONE) 20 MG tablet Take 2 tablets (40 mg total) by mouth daily. 04/14/16   Minna Antis, MD  predniSONE (STERAPRED UNI-PAK 21 TAB) 10 MG (21) TBPK tablet Dispense steroid taper pak as directed 05/13/16   Emily Filbert, MD  traMADol (ULTRAM) 50 MG tablet Take 1 tablet (50 mg total) by mouth every 6 (six) hours as needed. 04/13/16 04/13/17  Minna Antis, MD      VITAL SIGNS:  Blood pressure 131/85, pulse 76, temperature 97.9 F (36.6 C), temperature source Oral, resp. rate 20, height 5\' 10"  (1.778 m), weight 79.4 kg (175 lb), SpO2 100 %.  PHYSICAL EXAMINATION:  VITAL SIGNS: Vitals:   05/31/16 1240 05/31/16 1446  BP: 109/81 131/85  Pulse: 97 76  Resp: 20 20  Temp: 97.9 F (36.6 C)    GENERAL:40 y.o.male currently in no acute distress.  HEAD: Normocephalic, atraumatic.  EYES: Pupils equal, round, reactive to light. Extraocular muscles intact. No scleral icterus.  MOUTH: Moist mucosal membrane. Dentition intact. No abscess noted.  EAR, NOSE, THROAT: Clear without exudates. No external lesions.  NECK: Supple. No thyromegaly. No nodules. No JVD.  PULMONARY: Clear to ascultation, without wheeze rails or rhonci. No use of accessory muscles, Good respiratory effort. good air entry bilaterally CHEST: Nontender to palpation.  CARDIOVASCULAR: S1 and S2. Regular rate and rhythm. No murmurs, rubs, or gallops. No edema. Pedal pulses 2+ bilaterally.  GASTROINTESTINAL: Soft, Tender to palpation without rebound or guarding, nondistended. No masses. Positive bowel sounds. No hepatosplenomegaly.  MUSCULOSKELETAL: No swelling, clubbing, or edema. Range of motion full in all extremities.  NEUROLOGIC: Cranial nerves II through XII are intact. No gross  focal neurological deficits. Sensation intact. Reflexes intact.  SKIN: No ulceration, lesions, rashes, or cyanosis. Skin warm and dry. Turgor intact.  PSYCHIATRIC: Mood, affect within normal limits. The patient is awake, alert and oriented x 3. Insight, judgment intact.  Patient sleeping on my initial evaluation, when he awoke he became tearful complaining of pain  LABORATORY PANEL:   CBC  Recent Labs Lab 05/31/16 1319  WBC 11.5*  HGB 14.5  HCT 43.0  PLT 121*   ------------------------------------------------------------------------------------------------------------------  Chemistries   Recent Labs Lab 05/31/16 1319  NA 132*  K 2.8*  CL 94*  CO2 24  GLUCOSE 707*  BUN 8  CREATININE 0.83  CALCIUM 9.0  AST 15  ALT 17  ALKPHOS 101  BILITOT 1.1   ------------------------------------------------------------------------------------------------------------------  Cardiac Enzymes No results for input(s): TROPONINI in the last 168 hours. ------------------------------------------------------------------------------------------------------------------  RADIOLOGY:  Dg Abd 2 Views  Result Date: 05/31/2016 CLINICAL DATA:  Chronic lower abdominal pain. History of Crohn's disease. EXAM: ABDOMEN - 2 VIEW COMPARISON:  Radiographs of November 14, 2013. FINDINGS: The bowel gas pattern is normal. There is no evidence of free air. No radio-opaque calculi or other significant  radiographic abnormality is seen. IMPRESSION: No evidence bowel obstruction or ileus. Electronically Signed   By: Lupita RaiderJames  Green Jr, M.D.   On: 05/31/2016 14:08    EKG:   Orders placed or performed during the hospital encounter of 04/23/16  . EKG 12-Lead  . EKG 12-Lead    IMPRESSION AND PLAN:   40 year old African-American gentleman history of type 2 diabetes noncompliant with medications presenting with abdominal pain and elevated blood glucose  1. Hyperosmolar nonketotic state secondary to type 2 diabetes  restart home medications, IV fluid hydration and insulin sliding scale 2. Abdominal pain sounds more like IBS, start Bentyl, minimize narcotics 3. Hypokalemia: Replace potassium 4. Venous thrombi embolism prophylactic: Lovenox    All the records are reviewed and case discussed with ED provider. Management plans discussed with the patient, family and they are in agreement.  CODE STATUS: Full  TOTAL TIME TAKING CARE OF THIS PATIENT: 33` minutes.    Sanvi Ehler,  Mardi MainlandDavid K M.D on 05/31/2016 at 4:27 PM  Between 7am to 6pm - Pager - [phone]  After 6pm: House Pager: [phone]  [facility]  Office  [phone]  CC: Primary care physician; Derwood KaplanEason,  Ernest B, MD

## 2016-05-31 NOTE — ED Notes (Signed)
Admitting md in to see pt at this time. 

## 2016-05-31 NOTE — ED Provider Notes (Signed)
[facility] Emergency Department Provider Note        Time seen: ----------------------------------------- 1:00 PM on 05/31/2016 -----------------------------------------    I have reviewed the triage vital signs and the nursing notes.   HISTORY  Chief Complaint Abdominal Pain    HPI Roberto [name] is a 40 y.o. male who presents to ER with abdominal pain that started a month ago. Patient states he has been seen recently for same. Patient states pain is been getting worse and is having vomiting and diarrhea with this pain. He does carry a diagnosis of Crohn's disease, denies recent illness. He states he has not seen a gastrologist because they will not take Medicaid. He complains of diffuse abdominal pain, nothing makes it better.   Past Medical History:  Diagnosis Date  . Crohn disease (HCC)   . Diabetes mellitus   . Neuropathy (HCC)   . PTSD (post-traumatic stress disorder)   . Sickle cell anemia (HCC)     There are no active problems to display for this patient.   Past Surgical History:  Procedure Laterality Date  . CHOLECYSTECTOMY    . FOOT SURGERY    . ORCHIECTOMY      Allergies Cherry  Social History Social History  Substance Use Topics  . Smoking status: Current Every Day Smoker    Packs/day: 0.50    Types: Cigarettes  . Smokeless tobacco: Never Used  . Alcohol use No    Review of Systems Constitutional: Negative for fever. Cardiovascular: Negative for chest pain. Respiratory: Negative for shortness of breath. Gastrointestinal: Positive for abdominal pain, vomiting and diarrhea Genitourinary: Negative for dysuria. Musculoskeletal: Negative for back pain. Skin: Negative for rash. Neurological: Negative for headaches, focal weakness or numbness.  10-point ROS otherwise negative.  ____________________________________________   PHYSICAL EXAM:  VITAL SIGNS: ED Triage Vitals  Enc Vitals Group     BP 05/31/16 1240  109/81     Pulse Rate 05/31/16 1240 97     Resp 05/31/16 1240 20     Temp 05/31/16 1240 97.9 F (36.6 C)     Temp Source 05/31/16 1240 Oral     SpO2 05/31/16 1240 98 %     Weight 05/31/16 1241 175 lb (79.4 kg)     Height 05/31/16 1241 5\' 10"  (1.778 m)     Head Circumference --      Peak Flow --      Pain Score 05/31/16 1241 10     Pain Loc --      Pain Edu? --      Excl. in GC? --     Constitutional: Alert and oriented. Mild distress Eyes: Conjunctivae are normal. PERRL. Normal extraocular movements. ENT   Head: Normocephalic and atraumatic.   Nose: No congestion/rhinnorhea.   Mouth/Throat: Mucous membranes are moist.   Neck: No stridor. Cardiovascular: Normal rate, regular rhythm. No murmurs, rubs, or gallops. Respiratory: Normal respiratory effort without tachypnea nor retractions. Breath sounds are clear and equal bilaterally. No wheezes/rales/rhonchi. Gastrointestinal: Diffuse, nonfocal tenderness. No rebound or guarding. Normal bowel sounds. Musculoskeletal: Nontender with normal range of motion in all extremities. No lower extremity tenderness nor edema. Neurologic:  Normal speech and language. No gross focal neurologic deficits are appreciated.  Skin:  Skin is warm, dry and intact. No rash noted. Psychiatric: Mood and affect are normal. Speech and behavior are normal.  ____________________________________________  ED COURSE:  Pertinent labs & imaging results that were available during my care of the patient were reviewed by  me and considered in my medical decision making (see chart for details). Clinical Course  Patient is in no distress.We will check basic labs, give IV fluids and reevaluate.  FINDINGS: The bowel gas pattern is normal. There is no evidence of free air. No radio-opaque calculi or other significant radiographic abnormality is seen.  IMPRESSION: No evidence bowel obstruction or  ileus.        Procedures ____________________________________________   LABS (pertinent positives/negatives)  Labs Reviewed  CBC WITH DIFFERENTIAL/PLATELET - Abnormal; Notable for the following:       Result Value   WBC 11.5 (*)    MCV 79.5 (*)    Platelets 121 (*)    Neutro Abs 8.5 (*)    All other components within normal limits  COMPREHENSIVE METABOLIC PANEL - Abnormal; Notable for the following:    Sodium 132 (*)    Potassium 2.8 (*)    Chloride 94 (*)    Glucose, Bld 707 (*)    All other components within normal limits  GLUCOSE, CAPILLARY - Abnormal; Notable for the following:    Glucose-Capillary 528 (*)    All other components within normal limits  LIPASE, BLOOD  URINALYSIS COMPLETEWITH MICROSCOPIC ([facility] ONLY)  CBG MONITORING, ED   ____________________________________________  FINAL ASSESSMENT AND PLAN  Chronic abdominal pain, hyperglycemia  Plan: Patient with labs and imaging as dictated above. Patient presents with abdominal pain and hyperglycemia was discovered on lab work. He'll receive IV fluids, insulin and we will reevaluate.  Patient states he feels no better, blood sugar has improved some but he still hyperglycemic with abdominal pain. I will discuss with the hospitalist for admission. Emily Filbert, MD   Note: This dictation was prepared with Dragon dictation. Any transcriptional errors that result from this process are unintentional    Emily Filbert, MD 05/31/16 1546

## 2016-05-31 NOTE — ED Triage Notes (Signed)
Pt to ED via EMS with lower abdominal pain that started 1 mo ago, when patient was seen here.  Pt states pain has been getting worse and pt has had vomiting and diarrhea with his pain.  Has history of Chron's diagnosis.

## 2016-06-01 ENCOUNTER — Inpatient Hospital Stay: Payer: Medicaid Other

## 2016-06-01 LAB — BLOOD GAS, ARTERIAL
ALLENS TEST (PASS/FAIL): POSITIVE — AB
Acid-Base Excess: 1.5 mmol/L (ref 0.0–3.0)
Bicarbonate: 26.6 mEq/L (ref 21.0–28.0)
FIO2: 0.21
O2 Saturation: 97.3 %
PCO2 ART: 43 mmHg (ref 32.0–48.0)
PO2 ART: 94 mmHg (ref 83.0–108.0)
Patient temperature: 37
pH, Arterial: 7.4 (ref 7.350–7.450)

## 2016-06-01 LAB — BASIC METABOLIC PANEL
ANION GAP: 7 (ref 5–15)
BUN: 9 mg/dL (ref 6–20)
CALCIUM: 8.1 mg/dL — AB (ref 8.9–10.3)
CO2: 27 mmol/L (ref 22–32)
Chloride: 106 mmol/L (ref 101–111)
Creatinine, Ser: 1.06 mg/dL (ref 0.61–1.24)
Glucose, Bld: 154 mg/dL — ABNORMAL HIGH (ref 65–99)
Potassium: 2.6 mmol/L — CL (ref 3.5–5.1)
SODIUM: 140 mmol/L (ref 135–145)

## 2016-06-01 LAB — GLUCOSE, CAPILLARY
GLUCOSE-CAPILLARY: 102 mg/dL — AB (ref 65–99)
GLUCOSE-CAPILLARY: 169 mg/dL — AB (ref 65–99)
GLUCOSE-CAPILLARY: 215 mg/dL — AB (ref 65–99)
GLUCOSE-CAPILLARY: 153 mg/dL — AB (ref 65–99)
GLUCOSE-CAPILLARY: 129 mg/dL — AB (ref 65–99)
Glucose-Capillary: 140 mg/dL — ABNORMAL HIGH (ref 65–99)
Glucose-Capillary: 224 mg/dL — ABNORMAL HIGH (ref 65–99)
Glucose-Capillary: 227 mg/dL — ABNORMAL HIGH (ref 65–99)

## 2016-06-01 LAB — URINALYSIS COMPLETE WITH MICROSCOPIC (ARMC ONLY)
BILIRUBIN URINE: NEGATIVE
Glucose, UA: >500 mg/dL — AB
KETONES UR: NEGATIVE mg/dL
NITRITE: NEGATIVE
PH: 6 (ref 5.0–8.0)
Protein, ur: 100 mg/dL — AB
Specific Gravity, Urine: 1.018 (ref 1.005–1.030)

## 2016-06-01 LAB — CBC
HEMATOCRIT: 36 % — AB (ref 40.0–52.0)
HEMOGLOBIN: 12.5 g/dL — AB (ref 13.0–18.0)
MCH: 27.3 pg (ref 26.0–34.0)
MCHC: 34.9 g/dL (ref 32.0–36.0)
MCV: 78.2 fL — ABNORMAL LOW (ref 80.0–100.0)
Platelets: 98 10*3/uL — ABNORMAL LOW (ref 150–440)
RBC: 4.6 MIL/uL (ref 4.40–5.90)
RDW: 12.8 % (ref 11.5–14.5)
WBC: 8 10*3/uL (ref 3.8–10.6)

## 2016-06-01 LAB — HEMOGLOBIN A1C
HEMOGLOBIN A1C: 15.7 % — AB (ref 4.0–6.0)
Hgb A1c MFr Bld: 16.5 % — ABNORMAL HIGH (ref 4.0–6.0)

## 2016-06-01 LAB — MAGNESIUM: Magnesium: 1.7 mg/dL (ref 1.7–2.4)

## 2016-06-01 LAB — POTASSIUM: Potassium: 3 mmol/L — ABNORMAL LOW (ref 3.5–5.1)

## 2016-06-01 MED ORDER — INSULIN ASPART 100 UNIT/ML FLEXPEN: 5.0000 [IU] | PEN_INJECTOR | Freq: Three times a day (TID) | SUBCUTANEOUS | 0 refills | Status: DC

## 2016-06-01 MED ORDER — DIATRIZOATE MEGLUMINE & SODIUM 66-10 % PO SOLN
30.0000 mL | Freq: Once | ORAL | Status: AC
  Administered 2016-06-01: 30 mL via ORAL

## 2016-06-01 MED ORDER — FREESTYLE LANCETS MISC: 12 refills | Status: DC

## 2016-06-01 MED ORDER — POTASSIUM CHLORIDE CRYS ER 20 MEQ PO TBCR
40.0000 meq | EXTENDED_RELEASE_TABLET | ORAL | Status: AC
Start: 2016-06-01 — End: 2016-06-01
  Administered 2016-06-01 (×2): 40 meq via ORAL
  Filled 2016-06-01 (×2): qty 2

## 2016-06-01 MED ORDER — INSULIN PEN NEEDLE 29G X 10MM MISC: 1.0000 | Freq: Four times a day (QID) | 0 refills | Status: DC

## 2016-06-01 MED ORDER — TRAMADOL HCL 50 MG PO TABS: 50.0000 mg | ORAL_TABLET | Freq: Four times a day (QID) | ORAL | 0 refills | Status: DC | PRN

## 2016-06-01 MED ORDER — IOPAMIDOL (ISOVUE-300) INJECTION 61%
100.0000 mL | Freq: Once | INTRAVENOUS | Status: AC | PRN
  Administered 2016-06-01: 100 mL via INTRAVENOUS

## 2016-06-01 MED ORDER — INSULIN GLARGINE 100 UNIT/ML ~~LOC~~ SOLN
10.0000 [IU] | Freq: Every day | SUBCUTANEOUS | Status: DC
  Filled 2016-06-01: qty 0.1

## 2016-06-01 MED ORDER — CEFUROXIME AXETIL 500 MG PO TABS
500.0000 mg | ORAL_TABLET | Freq: Two times a day (BID) | ORAL | Status: DC
  Administered 2016-06-01 – 2016-06-03 (×5): 500 mg via ORAL
  Filled 2016-06-01 (×5): qty 1

## 2016-06-01 MED ORDER — CEFUROXIME AXETIL 500 MG PO TABS: 500.0000 mg | ORAL_TABLET | Freq: Two times a day (BID) | ORAL | 0 refills | Status: AC

## 2016-06-01 MED ORDER — POTASSIUM CHLORIDE CRYS ER 20 MEQ PO TBCR
30.0000 meq | EXTENDED_RELEASE_TABLET | Freq: Two times a day (BID) | ORAL | Status: AC
  Administered 2016-06-01 (×2): 30 meq via ORAL
  Filled 2016-06-01 (×2): qty 1

## 2016-06-01 MED ORDER — SODIUM CHLORIDE 0.9 % IV BOLUS (SEPSIS)
1000.0000 mL | Freq: Once | INTRAVENOUS | Status: AC
  Administered 2016-06-01: 1000 mL via INTRAVENOUS

## 2016-06-01 MED ORDER — INSULIN GLARGINE 100 UNIT/ML SOLOSTAR PEN: 28.0000 [IU] | PEN_INJECTOR | Freq: Every day | SUBCUTANEOUS | 11 refills | Status: DC

## 2016-06-01 MED ORDER — POTASSIUM CHLORIDE ER 10 MEQ PO TBCR: 20.0000 meq | EXTENDED_RELEASE_TABLET | Freq: Two times a day (BID) | ORAL | 0 refills | Status: DC

## 2016-06-01 MED ORDER — GLUCOSE BLOOD VI STRP: ORAL_STRIP | 12 refills | Status: AC

## 2016-06-01 NOTE — Progress Notes (Addendum)
Inpatient Diabetes Program Recommendations  AACE/ADA: New Consensus Statement on Inpatient Glycemic Control (2015)  Target Ranges:  Prepandial:   less than 140 mg/dL      Peak postprandial:   less than 180 mg/dL (1-2 hours)      Critically ill patients:  140 - 180 mg/dL   Lab Results  Component Value Date   GLUCAP 227 (H) 06/01/2016   HGBA1C SEE COMMENT 04/14/2013    Please place diagnosis of diabetes on the problem list so I can refer him to get outpatient diabetes education.  Needs prescriptions for a meter, lancets, strips, Lantus pen, pen needles, and Novolog pens on discharge.  I spoke to the patient- had been on Lantus and Novolog in the past but has not had a local doctor to get orders for insulin- requested prescription for a meter, dietitian visit and outpatient diabetes.   [facility] [place] [address]  Cabana ColonyJulie Mattilynn Forrer, [place]RN, [place]BA, [place]HA, CDE Diabetes Coordinator Inpatient Diabetes Program  [phone] (Team Pager) [phone] [facility] Office) 06/01/2016 10:59 AM

## 2016-06-01 NOTE — Consult Note (Addendum)
MEDICATION RELATED CONSULT NOTE - INITIAL   Pharmacy Consult for electrolytes Indication: hypokalemia  Allergies  Allergen Reactions  . Cherry Shortness Of Breath and Swelling    Facial swelling/SOB    Patient Measurements: Height: 5\' 10"  (177.8 cm) Weight: 175 lb (79.4 kg) IBW/kg (Calculated) : 73 Adjusted Body Weight:   Vital Signs: Temp: 97.6 F (36.4 C) (08/24 1500) Temp Source: Axillary (08/24 1500) BP: 99/63 (08/24 1500) Pulse Rate: 79 (08/24 1500) Intake/Output from previous day: 08/23 0701 - 08/24 0700 In: 1485 [P.O.:120; I.V.:1365] Out: 550 [Urine:550] Intake/Output from this shift: Total I/O In: 1765.1 [P.O.:600; I.V.:1165.1] Out: 100 [Urine:100]  Labs:  Recent Labs  05/31/16 1319 06/01/16 0456  WBC 11.5* 8.0  HGB 14.5 12.5*  HCT 43.0 36.0*  PLT 121* 98*  CREATININE 0.83 1.06  MG  --  1.7  ALBUMIN 3.8  --   PROT 7.1  --   AST 15  --   ALT 17  --   ALKPHOS 101  --   BILITOT 1.1  --    Estimated Creatinine Clearance: 97.6 mL/min (by C-G formula based on SCr of 1.06 mg/dL).   Microbiology: Recent Results (from the past 720 hour(s))  MRSA PCR Screening     Status: None   Collection Time: 05/31/16  6:45 PM  Result Value Ref Range Status   MRSA by PCR NEGATIVE NEGATIVE Final    Comment:        The GeneXpert MRSA Assay (FDA approved for NASAL specimens only), is one component of a comprehensive MRSA colonization surveillance program. It is not intended to diagnose MRSA infection nor to guide or monitor treatment for MRSA infections.     Medical History: Past Medical History:  Diagnosis Date  . Crohn disease (HCC)   . Diabetes mellitus   . Neuropathy (HCC)   . PTSD (post-traumatic stress disorder)   . Sickle cell anemia (HCC)     Medications:  Scheduled:  . cefUROXime  500 mg Oral BID WC  . diatrizoate meglumine-sodium  30 mL Oral Once  . dicyclomine  10 mg Oral TID AC & HS  . enoxaparin (LOVENOX) injection  40 mg Subcutaneous  Q24H  . gabapentin  800 mg Oral TID  . insulin aspart  0-20 Units Subcutaneous TID WC  . insulin aspart  0-5 Units Subcutaneous QHS  . insulin glargine  28 Units Subcutaneous QHS  . metFORMIN  1,000 mg Oral BID WC    Assessment: Pt is a 40 year old male admitted with hyperglycemia and abdominal pain. Pt found to have a K of 2.6. Mg WNL  Goal of Therapy:  K=3.5-5  Plan:  Pt ordered KCL 40 MEQ po X 2 by hospitalist. Pt also has fluids of 0.9% NaCl with 20 MEQ KCL running @ 16ml/hr (2.5MEQ/hr).   8/24@ 1500: K+=3.0 Will replace with KCL x 2 doses. Will recheck potassium with AM labs.    Cher Nakai, PharmD Clinical Pharmacist 06/01/2016 5:04 PM

## 2016-06-01 NOTE — Progress Notes (Signed)
PT sleeping and difficult to arouse this afternoon.  Awake and vocal for a short time and then quickly falls asleep again.  Dr Juliene Pina notified.  VS WNL.  RT drawing ABG at this time.

## 2016-06-01 NOTE — Consult Note (Signed)
MEDICATION RELATED CONSULT NOTE - INITIAL   Pharmacy Consult for electrolytes Indication: hypokalemia  Allergies  Allergen Reactions  . Cherry Shortness Of Breath and Swelling    Facial swelling/SOB    Patient Measurements: Height: 5\' 10"  (177.8 cm) Weight: 175 lb (79.4 kg) IBW/kg (Calculated) : 73 Adjusted Body Weight:   Vital Signs: Temp: 97.7 F (36.5 C) (08/24 0438) Temp Source: Oral (08/24 0438) BP: 99/65 (08/24 0438) Pulse Rate: 71 (08/24 0438) Intake/Output from previous day: 08/23 0701 - 08/24 0700 In: 1485 [P.O.:120; I.V.:1365] Out: 550 [Urine:550] Intake/Output from this shift: Total I/O In: 306.8 [P.O.:120; I.V.:186.8] Out: 0   Labs:  Recent Labs  05/31/16 1319 06/01/16 0456  WBC 11.5* 8.0  HGB 14.5 12.5*  HCT 43.0 36.0*  PLT 121* 98*  CREATININE 0.83 1.06  MG  --  1.7  ALBUMIN 3.8  --   PROT 7.1  --   AST 15  --   ALT 17  --   ALKPHOS 101  --   BILITOT 1.1  --    Estimated Creatinine Clearance: 97.6 mL/min (by C-G formula based on SCr of 1.06 mg/dL).   Microbiology: Recent Results (from the past 720 hour(s))  MRSA PCR Screening     Status: None   Collection Time: 05/31/16  6:45 PM  Result Value Ref Range Status   MRSA by PCR NEGATIVE NEGATIVE Final    Comment:        The GeneXpert MRSA Assay (FDA approved for NASAL specimens only), is one component of a comprehensive MRSA colonization surveillance program. It is not intended to diagnose MRSA infection nor to guide or monitor treatment for MRSA infections.     Medical History: Past Medical History:  Diagnosis Date  . Crohn disease (HCC)   . Diabetes mellitus   . Neuropathy (HCC)   . PTSD (post-traumatic stress disorder)   . Sickle cell anemia (HCC)     Medications:  Scheduled:  . cefUROXime  500 mg Oral BID WC  . dicyclomine  10 mg Oral TID AC & HS  . enoxaparin (LOVENOX) injection  40 mg Subcutaneous Q24H  . gabapentin  800 mg Oral TID  . insulin aspart  0-20 Units  Subcutaneous TID WC  . insulin aspart  0-5 Units Subcutaneous QHS  . insulin glargine  28 Units Subcutaneous QHS  . metFORMIN  1,000 mg Oral BID WC  . pneumococcal 23 valent vaccine  0.5 mL Intramuscular Tomorrow-1000  . potassium chloride  40 mEq Oral Q4H    Assessment: Pt is a 40 year old male admitted with hyperglycemia and abdominal pain. Pt found to have a K of 2.6. Mg WNL  Goal of Therapy:  K=3.5-5  Plan:  Pt ordered KCL 40 MEQ po X 2 by hospitalist. Pt also has fluids of 0.9% NaCl with 20 MEQ KCL running @ 14ml/hr (2.5MEQ/hr). Will recheck K at 1600.    Roberto [name] 06/01/2016,9:09 AM

## 2016-06-01 NOTE — Progress Notes (Signed)
Patient not able to be discharged today. He has decreased UOP noted this afternoon and low blood pressure.  I have ordered CT ABD due to anuria nd UTI/abdominal pain Continue agreesive hydration. BMP for am.  Please see d/c summary for today's progress note.

## 2016-06-01 NOTE — Progress Notes (Signed)
  RD consulted for nutrition education regarding diabetes.   Lab Results  Component Value Date   HGBA1C 15.7 (H) 05/31/2016    Pt very sleepy on visit x 2 today. Attempted to wake pt multiple times on both visits and pt would open eyes and acknowledge writer and then fall back asleep. RD provided "Carbohydrate Counting for People with Diabetes" handout from the Academy of Nutrition and Dietetics and left on tray table by pt room. Noted referral for outpatient education; agree that pt would benefit from education as outpatient. If pt not discharged today, will follow-up and provide further education if able  [facility] MS, RD, LDN [phone] Pager  [phone] Weekend/On-Call Pager

## 2016-06-01 NOTE — Progress Notes (Addendum)
Inpatient Diabetes Program Recommendations  AACE/ADA: New Consensus Statement on Inpatient Glycemic Control (2015)  Target Ranges:  Prepandial:   less than 140 mg/dL      Peak postprandial:   less than 180 mg/dL (1-2 hours)      Critically ill patients:  140 - 180 mg/dL   Lab Results  Component Value Date   GLUCAP 227 (H) 06/01/2016   HGBA1C SEE COMMENT 04/14/2013    Review of Glycemic Control  Results for Roberto [name], Roberto [name] (MRN [id number]) as of 06/01/2016 07:41  Ref. Range 05/31/2016 21:16 05/31/2016 23:52 06/01/2016 02:13 06/01/2016 05:44 06/01/2016 07:27  Glucose-Capillary Latest Ref Range: 65 - 99 mg/dL 706 (H) 237 (H) 628 (H) 169 (H) 227 (H)    Diabetes history: Type 2 Outpatient Diabetes medications: Lantus 28 units qhs, Metformin 1000mg  bid, Novolog 0-10 units sliding scale tid with meals Current orders for Inpatient glycemic control: Lantus 28 units qhs, Metformin 1000mg  bid, Novolog 0-20 units tid, Novolog 0-5 units qhs  Inpatient Diabetes Program Recommendations:  Please consider adding Novolog 3 units tid with meals (hold if he eats less than 50%), continue Novolog correction but decrease to moderate correction 0-15 units tid.    Please change Levemir to be started now- patient has Type 1 diabetes (per Dr. Tedd Sias- last admission)  Text paged Dr. Juliene Pina with recommendations.   Susette Racer, RN, BA, MHA, CDE Diabetes Coordinator Inpatient Diabetes Program  [phone] (Team Pager) [phone] [facility] Office) 06/01/2016 7:52 AM

## 2016-06-01 NOTE — Discharge Summary (Addendum)
[facility] [place] at [facility]   PATIENT NAME: Roberto [name]    MR#:  [id number]  DATE OF BIRTH:  1976-05-12  DATE OF ADMISSION:  05/31/2016 ADMITTING PHYSICIAN: Wyatt Haste, MD  DATE OF DISCHARGE: 06/01/2016  PRIMARY CARE PHYSICIAN: Derwood Kaplan, MD    ADMISSION DIAGNOSIS:  Generalized abdominal pain [R10.84] Hyperglycemia [R73.9]  DISCHARGE DIAGNOSIS:  Type 1 diabetes with hyperglycemia   SECONDARY DIAGNOSIS:   Past Medical History:  Diagnosis Date  . Crohn disease (HCC)   . Diabetes mellitus   . Neuropathy (HCC)   . PTSD (post-traumatic stress disorder)   . Sickle cell anemia [facility])     HOSPITAL COURSE:   40 year old male with a history of type 1 diabetes as per Dr. Donavan Burnet last admission note who presents with abdominal pain and found to have uncontrolled blood sugars without acidosis.  1. Uncontrolled hyperglycemia with type 1 diabetes: Patient was not acidotic her blood sugars were high. As per Dr. Alphonsus Sias last admission note it appears the patient has type 1 diabetes. He was evaluated by diabetes coordinator while in the hospital after being treated for his uncontrolled hyperglycemia with IV fluids. Ge really wanted to continue Lantus rather than oral medications which may be more appropriate for him. He was placed back on Lantus with 5 units 3 times a day of NovoLog. He will follow-up with endocrinology/PCP  2. Abdominal pain: This is due to urinary tract infection. Patient is started on Ceftin.  3. Hypokalemia: This is repleted prior to discharge. Magnesium level was within normal limits.   DISCHARGE CONDITIONS AND DIET:  Stable  Diabetic diet  CONSULTS OBTAINED:    DRUG ALLERGIES:   Allergies  Allergen Reactions  . Cherry Shortness Of Breath and Swelling    Facial swelling/SOB    DISCHARGE MEDICATIONS:   Current Discharge Medication List    START taking these medications   Details  cefUROXime (CEFTIN) 500 MG tablet  Take 1 tablet (500 mg total) by mouth 2 (two) times daily with a meal. Qty: 28 tablet, Refills: 0    glucose blood (FREESTYLE LITE) test strip Use as instructed Qty: 100 each, Refills: 12    insulin aspart (NOVOLOG) 100 UNIT/ML FlexPen Inject 5 Units into the skin 3 (three) times daily with meals. Qty: 150 mL, Refills: 0    Insulin Glargine (LANTUS) 100 UNIT/ML Solostar Pen Inject 28 Units into the skin daily at 10 pm. Qty: 15 mL, Refills: 11    Insulin Pen Needle 29G X MISC 1 each by Does not apply route 4 (four) times daily. Qty: 100 each, Refills: 0    Lancets (FREESTYLE) lancets Use as instructed Qty: 100 each, Refills: 12    potassium chloride (K-DUR) 10 MEQ tablet Take 2 tablets (20 mEq total) by mouth 2 (two) times daily. Qty: 2 tablet, Refills: 0      CONTINUE these medications which have CHANGED   Details  traMADol (ULTRAM) 50 MG tablet Take 1 tablet (50 mg total) by mouth every 6 (six) hours as needed. Qty: 15 tablet, Refills: 0      CONTINUE these medications which have NOT CHANGED   Details  gabapentin (NEURONTIN) 800 MG tablet Take 800 mg by mouth 3 (three) times daily.    insulin aspart (NOVOLOG) 100 UNIT/ML injection Inject into the skin 3 (three) times daily with meals. Inject subcutaneous 3 times a day with meal per sliding scale as directed for blood sugar: 0-149= 0 units 150-200=  2 units 201-250= 4 units 251-300= 6 units,  301-350= 8 units, 351-400= 10 units 401 or greater=come to emergency department      STOP taking these medications     metFORMIN (GLUCOPHAGE) 1000 MG tablet      cephALEXin (KEFLEX) 500 MG capsule      gabapentin (NEURONTIN) 300 MG capsule      insulin glargine (LANTUS) 100 UNIT/ML injection      insulin NPH-insulin regular (NOVOLIN 70/30) (70-30) 100 UNIT/ML injection      oxyCODONE (ROXICODONE) 5 MG immediate release tablet      predniSONE (DELTASONE) 20 MG tablet      predniSONE (STERAPRED UNI-PAK 21 TAB) 10 MG  (21) TBPK tablet               Today   CHIEF COMPLAINT:  Patient reports having polyuria and frequency and urgency   VITAL SIGNS:  Blood pressure 99/65, pulse 71, temperature 97.7 F (36.5 C), temperature source Oral, resp. rate 17, height 5\' 10"  (1.778 m), weight 79.4 kg (175 lb), SpO2 99 %.   REVIEW OF SYSTEMS:  Review of Systems  Constitutional: Negative.  Negative for chills, fever and malaise/fatigue.  HENT: Negative.  Negative for ear discharge, ear pain, hearing loss, nosebleeds and sore throat.   Eyes: Negative.  Negative for blurred vision and pain.  Respiratory: Negative.  Negative for cough, hemoptysis, shortness of breath and wheezing.   Cardiovascular: Negative.  Negative for chest pain, palpitations and leg swelling.  Gastrointestinal: Positive for abdominal pain. Negative for blood in stool, diarrhea, nausea and vomiting.  Genitourinary: Positive for frequency and urgency. Negative for dysuria.  Musculoskeletal: Negative.  Negative for back pain.  Skin: Negative.   Neurological: Negative for dizziness, tremors, speech change, focal weakness, seizures and headaches.  Endo/Heme/Allergies: Negative.  Does not bruise/bleed easily.  Psychiatric/Behavioral: Negative.  Negative for depression, hallucinations and suicidal ideas.     PHYSICAL EXAMINATION:  GENERAL:  40 y.o.-year-old patient lying in the bed with no acute distress.  NECK:  Supple, no jugular venous distention. No thyroid enlargement, no tenderness.  LUNGS: Normal breath sounds bilaterally, no wheezing, rales,rhonchi  No use of accessory muscles of respiration.  CARDIOVASCULAR: S1, S2 normal. No murmurs, rubs, or gallops.  ABDOMEN: Soft, non-tender, non-distended. Bowel sounds present. No organomegaly or mass.  EXTREMITIES: No pedal edema, cyanosis, or clubbing.  PSYCHIATRIC: The patient is alert and oriented x 3.  SKIN: No obvious rash, lesion, or ulcer.   DATA REVIEW:   CBC  Recent  Labs Lab 06/01/16 0456  WBC 8.0  HGB 12.5*  HCT 36.0*  PLT 98*    Chemistries   Recent Labs Lab 05/31/16 1319 06/01/16 0456  NA 132* 140  K 2.8* 2.6*  CL 94* 106  CO2 24 27  GLUCOSE 707* 154*  BUN 8 9  CREATININE 0.83 1.06  CALCIUM 9.0 8.1*  MG  --  1.7  AST 15  --   ALT 17  --   ALKPHOS 101  --   BILITOT 1.1  --     Cardiac Enzymes No results for input(s): TROPONINI in the last 168 hours.  Microbiology Results  @[email or web address] @  RADIOLOGY:  Dg Abd 2 Views  Result Date: 05/31/2016 CLINICAL DATA:  Chronic lower abdominal pain. History of Crohn's disease. EXAM: ABDOMEN - 2 VIEW COMPARISON:  Radiographs of November 14, 2013. FINDINGS: The bowel gas pattern is normal. There is no evidence of free air. No radio-opaque calculi or other significant radiographic abnormality  is seen. IMPRESSION: No evidence bowel obstruction or ileus. Electronically Signed   By: Lupita RaiderJames  Green Jr, M.D.   On: 05/31/2016 14:08      Management plans discussed with the patient and he is in agreement. Stable for discharge   Patient should follow up with pcp  CODE STATUS:     Code Status Orders        Start     Ordered   05/31/16 1609  Full code  Continuous     05/31/16 1609    Code Status History    Date Active Date Inactive Code Status Order ID Comments User Context   04/04/2012  1:25 AM 04/04/2012 12:49 PM Full Code [id number]  Scarlette CalicoFrances C. [place] ED   04/04/2012  1:23 AM 04/04/2012  1:25 AM Full Code [id number]  Izola PriceFrances C. [place] ED   03/20/2012  2:43 PM 03/20/2012  9:42 PM Full Code [id number]  Jaci CarrelLisette Paz, PA-C ED   01/17/2012 11:32 AM 01/17/2012 11:14 PM Full Code [id number]  Lottie Musselatyana A Kirichenko, PA ED      TOTAL TIME TAKING CARE OF THIS PATIENT: 35 minutes.    Note: This dictation was prepared with Dragon dictation along with smaller phrase technology. Any transcriptional errors that result from this process are unintentional.  Ranata Laughery M.D on 06/01/2016 at 12:32 PM  Between  7am to 6pm - Pager - [phone] After 6pm go to [email or web address] - Social research officer, governmentpassword [email or web address] [email or web address]  [facility]  Office  [phone]  CC: Primary care physician; Derwood KaplanEason,  Ernest B, MD

## 2016-06-02 ENCOUNTER — Inpatient Hospital Stay: Payer: Medicaid Other

## 2016-06-02 LAB — BASIC METABOLIC PANEL
Anion gap: 6 (ref 5–15)
BUN: 11 mg/dL (ref 6–20)
CO2: 25 mmol/L (ref 22–32)
Calcium: 8 mg/dL — ABNORMAL LOW (ref 8.9–10.3)
Chloride: 109 mmol/L (ref 101–111)
Creatinine, Ser: 0.81 mg/dL (ref 0.61–1.24)
GFR calc Af Amer: >60 mL/min (ref >60)
GLUCOSE: 260 mg/dL — AB (ref 65–99)
POTASSIUM: 3 mmol/L — AB (ref 3.5–5.1)
Sodium: 140 mmol/L (ref 135–145)

## 2016-06-02 LAB — GLUCOSE, CAPILLARY
GLUCOSE-CAPILLARY: 296 mg/dL — AB (ref 65–99)
GLUCOSE-CAPILLARY: 266 mg/dL — AB (ref 65–99)
Glucose-Capillary: 286 mg/dL — ABNORMAL HIGH (ref 65–99)
Glucose-Capillary: 170 mg/dL — ABNORMAL HIGH (ref 65–99)

## 2016-06-02 LAB — POTASSIUM: Potassium: 4.2 mmol/L (ref 3.5–5.1)

## 2016-06-02 MED ORDER — TESTOSTERONE 50 MG/5GM (1%) TD GEL: 5.0000 g | Freq: Every day | TRANSDERMAL | Status: DC

## 2016-06-02 MED ORDER — LIVING WELL WITH DIABETES BOOK
Freq: Once | Status: AC
  Administered 2016-06-02: 1
  Filled 2016-06-02: qty 1

## 2016-06-02 MED ORDER — GADOBENATE DIMEGLUMINE 529 MG/ML IV SOLN
15.0000 mL | Freq: Once | INTRAVENOUS | Status: AC | PRN
  Administered 2016-06-02: 15 mL via INTRAVENOUS

## 2016-06-02 MED ORDER — INSULIN GLARGINE 100 UNIT/ML ~~LOC~~ SOLN
20.0000 [IU] | Freq: Every day | SUBCUTANEOUS | Status: DC
Start: 2016-06-02 — End: 2016-06-03
  Administered 2016-06-02: 20 [IU] via SUBCUTANEOUS
  Filled 2016-06-02 (×2): qty 0.2

## 2016-06-02 MED ORDER — POTASSIUM CHLORIDE 20 MEQ PO PACK
40.0000 meq | PACK | ORAL | Status: AC
  Administered 2016-06-02 (×2): 40 meq via ORAL
  Filled 2016-06-02 (×2): qty 2

## 2016-06-02 MED ORDER — POTASSIUM CHLORIDE CRYS ER 20 MEQ PO TBCR
20.0000 meq | EXTENDED_RELEASE_TABLET | ORAL | Status: DC
Start: 2016-06-02 — End: 2016-06-02
  Filled 2016-06-02: qty 1

## 2016-06-02 MED ORDER — MAGNESIUM SULFATE 2 GM/50ML IV SOLN
2.0000 g | Freq: Once | INTRAVENOUS | Status: AC
  Administered 2016-06-02: 2 g via INTRAVENOUS
  Filled 2016-06-02: qty 50

## 2016-06-02 NOTE — Progress Notes (Signed)
Inpatient Diabetes Program Recommendations  AACE/ADA: New Consensus Statement on Inpatient Glycemic Control (2015)  Target Ranges:  Prepandial:   less than 140 mg/dL      Peak postprandial:   less than 180 mg/dL (1-2 hours)      Critically ill patients:  140 - 180 mg/dL   Lab Results  Component Value Date   GLUCAP 286 (H) 06/02/2016   HGBA1C 16.5 (H) 06/01/2016    Review of Glycemic Control  Results for Roberto [name], Roberto [name] (MRN [id number]) as of 06/02/2016 08:22  Ref. Range 06/01/2016 15:11 06/01/2016 16:30 06/01/2016 22:28 06/01/2016 23:33 06/02/2016 07:44  Glucose-Capillary Latest Ref Range: 65 - 99 mg/dL 111 (H) 735 (H) 670 (H) 140 (H) 286 (H)    Outpatient Diabetes medications:  Metformin 1000mg  bid recently (has taken Lantus 28 units qhs, Novolog 0-10 units sliding scale tid with meals and Metformin 1000mg  bid in the past )  Current orders for Inpatient glycemic control: Lantus 10 units qhs, Metformin 1000mg  bid, Novolog 0-20 units tid, Novolog 0-5 units qhs  Inpatient Diabetes Program Recommendations: Patient blood sugars elevated this morning because Lantus was not given last night.  Please consider adding Novolog 3 units tid with meals (hold if he eats less than 50%), continue Novolog correction but decrease to moderate correction 0-15 units tid.    Please change Lantus to 20 units qday to be started now (0.25units/kg)  Paged Dr. Juliene Pina with recommendations.  Susette Racer, RN, BA, MHA, CDE Diabetes Coordinator Inpatient Diabetes Program  [phone] (Team Pager) [phone] [facility] Office) 06/02/2016 8:47 AM

## 2016-06-02 NOTE — Plan of Care (Signed)
Problem: Food- and Nutrition-Related Knowledge Deficit (NB-1.1) Goal: Nutrition education Formal process to instruct or train a patient/client in a skill or to impart knowledge to help patients/clients voluntarily manage or modify food choices and eating behavior to maintain or improve health. Outcome: Progressing  RD consulted for nutrition education regarding diabetes.   Lab Results  Component Value Date   HGBA1C 16.5 (H) 06/01/2016    RD provided "Carbohydrate Counting for People with Diabetes" handout from the Academy of Nutrition and Dietetics. Also provided pt with "Plate Method" handout. Discussed different food groups and their effects on blood sugar, emphasizing carbohydrate-containing foods. Provided list of carbohydrates and recommended serving sizes of common foods. Reviewed plate method. Reinforced importance of choosing sugar free beverages.   Discussed importance of controlled and consistent carbohydrate intake throughout the day. Provided examples of ways to balance meals/snacks and encouraged intake of high-fiber, whole grain complex carbohydrates. Teach back method used but pt unable to identify carb-containing food items post education.   Expect poor compliance due to pt's inability to grasp basic concepts of diabetic diet. Agree with referral for outpatient education; noted orders placed. Discussed with pt and reinforced importance of pt following up with outpatient education. Pt agrees that outpatient ed would be beneficial  Body mass index is 25.11 kg/m.   Current diet order is Carb Modified, patient is consuming approximately 50-100% of meals at this time. Labs and medications reviewed.Will follow-up and provide further diet education during hospital stay if able. RD contact information provided. If additional nutrition issues or questions arise, please contact RD.  Romelle Starcher MS, RD, LDN [phone] Pager  [phone] Weekend/On-Call Pager

## 2016-06-02 NOTE — Consult Note (Signed)
MEDICATION RELATED CONSULT NOTE -follow up  Pharmacy Consult for electrolytes Indication: hypokalemia  Allergies  Allergen Reactions  . Cherry Shortness Of Breath and Swelling    Facial swelling/SOB    Patient Measurements: Height: 5\' 10"  (177.8 cm) Weight: 175 lb (79.4 kg) IBW/kg (Calculated) : 73 Adjusted Body Weight:   Vital Signs: Temp: 98.4 F (36.9 C) (08/25 1327) Temp Source: Oral (08/25 1327) BP: 122/80 (08/25 1327) Pulse Rate: 82 (08/25 1327) Intake/Output from previous day: 08/24 0701 - 08/25 0700 In: 4501.1 [P.O.:960; I.V.:2459.1; IV Piggyback:1082] Out: 650 [Urine:650] Intake/Output from this shift: No intake/output data recorded.  Labs:  Recent Labs  05/31/16 1319 06/01/16 0456 06/02/16 0430  WBC 11.5* 8.0  --   HGB 14.5 12.5*  --   HCT 43.0 36.0*  --   PLT 121* 98*  --   CREATININE 0.83 1.06 0.81  MG  --  1.7  --   ALBUMIN 3.8  --   --   PROT 7.1  --   --   AST 15  --   --   ALT 17  --   --   ALKPHOS 101  --   --   BILITOT 1.1  --   --    Estimated Creatinine Clearance: 127.7 mL/min (by C-G formula based on SCr of 0.81 mg/dL).   Microbiology: Recent Results (from the past 720 hour(s))  MRSA PCR Screening     Status: None   Collection Time: 05/31/16  6:45 PM  Result Value Ref Range Status   MRSA by PCR NEGATIVE NEGATIVE Final    Comment:        The GeneXpert MRSA Assay (FDA approved for NASAL specimens only), is one component of a comprehensive MRSA colonization surveillance program. It is not intended to diagnose MRSA infection nor to guide or monitor treatment for MRSA infections.     Medical History: Past Medical History:  Diagnosis Date  . Crohn disease (HCC)   . Diabetes mellitus   . Neuropathy (HCC)   . PTSD (post-traumatic stress disorder)   . Sickle cell anemia (HCC)     Medications:  Scheduled:  . cefUROXime  500 mg Oral BID WC  . dicyclomine  10 mg Oral TID AC & HS  . enoxaparin (LOVENOX) injection  40 mg  Subcutaneous Q24H  . gabapentin  800 mg Oral TID  . insulin aspart  0-20 Units Subcutaneous TID WC  . insulin aspart  0-5 Units Subcutaneous QHS  . insulin glargine  20 Units Subcutaneous Daily  . testosterone  5 g Transdermal Daily    Assessment: Pt is a 40 year old male admitted with hyperglycemia and abdominal pain. Pt found to have a K of 2.6. Mg WNL  Goal of Therapy:  K=3.5-5 Mg= 1.7-2.4  Plan:  Pt has fluids of 0.9% NaCl with 20 MEQ KCL/L running @ 182ml/hr (2.5MEQ/hr).   8/24@ 1500: K+=3.0 Will replace with KCL x 2 doses. Will recheck potassium with AM labs.    8/25   K=3.0  (8/24)Mag= 1.7.   Will order KCL PO q2h x 2 doses. Note: Pt with IV fluids w/ KCL running.  Since KCL still low after supplementation will give Magnesium 2 gram IV x 1 since it is borderline low. Will recheck K at 1800. (and with am labs)   MD has changed K+ order to 40 meq po q4h x 2 doses instead of 20 meq.  8/25 1900: K=4.2 Will not give potassium at this time.  BMP scheduled for the morning with magnesium level. Recheck potassium level and may need to discontinue continuous fluids with potassium.

## 2016-06-02 NOTE — Consult Note (Addendum)
MEDICATION RELATED CONSULT NOTE -follow up  Pharmacy Consult for electrolytes Indication: hypokalemia  Allergies  Allergen Reactions  . Cherry Shortness Of Breath and Swelling    Facial swelling/SOB    Patient Measurements: Height: 5\' 10"  (177.8 cm) Weight: 175 lb (79.4 kg) IBW/kg (Calculated) : 73 Adjusted Body Weight:   Vital Signs: Temp: 98.3 F (36.8 C) (08/25 0419) Temp Source: Oral (08/25 0419) BP: 119/82 (08/25 0419) Pulse Rate: 78 (08/25 0419) Intake/Output from previous day: 08/24 0701 - 08/25 0700 In: 4501.1 [P.O.:960; I.V.:2459.1; IV Piggyback:1082] Out: 650 [Urine:650] Intake/Output from this shift: Total I/O In: 436.7 [P.O.:120; I.V.:316.7] Out: 0   Labs:  Recent Labs  05/31/16 1319 06/01/16 0456 06/02/16 0430  WBC 11.5* 8.0  --   HGB 14.5 12.5*  --   HCT 43.0 36.0*  --   PLT 121* 98*  --   CREATININE 0.83 1.06 0.81  MG  --  1.7  --   ALBUMIN 3.8  --   --   PROT 7.1  --   --   AST 15  --   --   ALT 17  --   --   ALKPHOS 101  --   --   BILITOT 1.1  --   --    Estimated Creatinine Clearance: 127.7 mL/min (by C-G formula based on SCr of 0.81 mg/dL).   Microbiology: Recent Results (from the past 720 hour(s))  MRSA PCR Screening     Status: None   Collection Time: 05/31/16  6:45 PM  Result Value Ref Range Status   MRSA by PCR NEGATIVE NEGATIVE Final    Comment:        The GeneXpert MRSA Assay (FDA approved for NASAL specimens only), is one component of a comprehensive MRSA colonization surveillance program. It is not intended to diagnose MRSA infection nor to guide or monitor treatment for MRSA infections.     Medical History: Past Medical History:  Diagnosis Date  . Crohn disease (HCC)   . Diabetes mellitus   . Neuropathy (HCC)   . PTSD (post-traumatic stress disorder)   . Sickle cell anemia (HCC)     Medications:  Scheduled:  . cefUROXime  500 mg Oral BID WC  . dicyclomine  10 mg Oral TID AC & HS  . enoxaparin (LOVENOX)  injection  40 mg Subcutaneous Q24H  . gabapentin  800 mg Oral TID  . insulin aspart  0-20 Units Subcutaneous TID WC  . insulin aspart  0-5 Units Subcutaneous QHS  . insulin glargine  10 Units Subcutaneous QHS  . magnesium sulfate 1 - 4 g bolus IVPB  2 g Intravenous Once  . metFORMIN  1,000 mg Oral BID WC  . potassium chloride  20 mEq Oral Q2H    Assessment: Pt is a 40 year old male admitted with hyperglycemia and abdominal pain. Pt found to have a K of 2.6. Mg WNL  Goal of Therapy:  K=3.5-5  Plan:  Pt has fluids of 0.9% NaCl with 20 MEQ KCL/L running @ 13025ml/hr (2.5MEQ/hr).   8/24@ 1500: K+=3.0 Will replace with KCL 30mEq x 2 doses. Will recheck potassium with AM labs.    8/25   K=3.0  (8/24)Mag= 1.7.   Will order KCL 20meq PO q2h x 2 doses. Note: Pt with IV fluids w/ KCL running.  Since KCL still low after supplementation will give Magnesium 2 gram IV x 1 since it is borderline low. Will recheck K at 1800. (and with am labs)  Addendum:  MD has changed K+ order to 40 meq po q4h x 2 doses instead of 20 meq.   Bari Mantis PharmD Clinical Pharmacist 06/02/2016 9:00 AM

## 2016-06-02 NOTE — Progress Notes (Signed)
Spoke at length with patient regarding elevated A1C- patient open to education and greatly disturbed by recent A1C> 16%. He tells me he had his A1C close to 8% in the past but couldn't afford medications resulting in elevated numbers now.   Living Well with Diabetes ordered for the patient. Encouraged he access American Diabetes Association for additional information. Outpatient education for the [facility] ordered by MD. Reviewed CBG goals and A1C goals with the patient.    Susette Racer, RN, BA, MHA, CDE Diabetes Coordinator Inpatient Diabetes Program  [phone] (Team Pager) [phone] [facility] Office) 06/02/2016 4:07 PM

## 2016-06-02 NOTE — Progress Notes (Signed)
[employer] [facility] - [place] at [facility]   PATIENT NAME: Roberto [name]    MR#:  [id number]  DATE OF BIRTH:  07/27/1976  SUBJECTIVE:  CHIEF COMPLAINT:   Chief Complaint  Patient presents with  . Abdominal Pain   - Sleeping, appears very comfortable. Arousable. -noncompliant with his diet. Sugars are elevated.  REVIEW OF SYSTEMS:  Review of Systems  Constitutional: Negative for chills, fever and malaise/fatigue.  HENT: Negative for ear discharge, ear pain and nosebleeds.   Respiratory: Negative for cough, shortness of breath and wheezing.   Cardiovascular: Negative for chest pain, palpitations and leg swelling.  Gastrointestinal: Positive for abdominal pain, diarrhea and nausea. Negative for constipation and vomiting.  Genitourinary: Negative for dysuria.  Musculoskeletal: Positive for joint pain and myalgias.  Neurological: Negative for dizziness, sensory change, speech change, focal weakness, seizures and headaches.  Psychiatric/Behavioral: Negative for depression.    DRUG ALLERGIES:   Allergies  Allergen Reactions  . Cherry Shortness Of Breath and Swelling    Facial swelling/SOB    VITALS:  Blood pressure 122/80, pulse 82, temperature 98.4 F (36.9 C), temperature source Oral, resp. rate 18, height 5\' 10"  (1.778 m), weight 79.4 kg (175 lb), SpO2 100 %.  PHYSICAL EXAMINATION:  Physical Exam  GENERAL:  40 y.o.-year-old patient lying in the bed with no acute distress.  EYES: Pupils equal, round, reactive to light and accommodation. No scleral icterus. Extraocular muscles intact.  HEENT: Head atraumatic, normocephalic. Oropharynx and nasopharynx clear.  NECK:  Supple, no jugular venous distention. No thyroid enlargement, no tenderness.  LUNGS: Normal breath sounds bilaterally, no wheezing, rales,rhonchi or crepitation. No use of accessory muscles of respiration.  CARDIOVASCULAR: S1, S2 normal. No murmurs, rubs, or gallops.  ABDOMEN: Soft, nontender, Mild  discomfort on palpation in periumbilical region, nondistended. Bowel sounds present. No organomegaly or mass.  EXTREMITIES: No pedal edema, cyanosis, or clubbing.  NEUROLOGIC: Cranial nerves II through XII are intact. Muscle strength 5/5 in all extremities. Sensation intact. Gait not checked.  PSYCHIATRIC: The patient is alert and oriented x 3.  SKIN: No obvious rash, lesion, or ulcer.    LABORATORY PANEL:   CBC  Recent Labs Lab 06/01/16 0456  WBC 8.0  HGB 12.5*  HCT 36.0*  PLT 98*   ------------------------------------------------------------------------------------------------------------------  Chemistries   Recent Labs Lab 05/31/16 1319 06/01/16 0456  06/02/16 0430  NA 132* 140  --  140  K 2.8* 2.6*  < > 3.0*  CL 94* 106  --  109  CO2 24 27  --  25  GLUCOSE 707* 154*  --  260*  BUN 8 9  --  11  CREATININE 0.83 1.06  --  0.81  CALCIUM 9.0 8.1*  --  8.0*  MG  --  1.7  --   --   AST 15  --   --   --   ALT 17  --   --   --   ALKPHOS 101  --   --   --   BILITOT 1.1  --   --   --   < > = values in this interval not displayed. ------------------------------------------------------------------------------------------------------------------  Cardiac Enzymes No results for input(s): TROPONINI in the last 168 hours. ------------------------------------------------------------------------------------------------------------------  RADIOLOGY:  Mr Abdomen W Wo Contrast  Result Date: 06/02/2016 CLINICAL DATA:  Right renal lesion on CT. EXAM: MRI ABDOMEN WITHOUT AND WITH CONTRAST TECHNIQUE: Multiplanar multisequence MR imaging of the abdomen was performed both before and after the administration of intravenous  contrast. CONTRAST:  15mL MULTIHANCE GADOBENATE DIMEGLUMINE 529 MG/ML IV SOLN COMPARISON:  06/01/2016 CT FINDINGS: Lower chest: Normal heart size without pericardial or pleural effusion. Hepatobiliary: Normal liver. Cholecystectomy, without biliary ductal dilatation.  Pancreas: Normal, without mass or ductal dilatation. Spleen: Normal in size, without focal abnormality. Adrenals/Urinary Tract: Normal adrenal glands. Normal left kidney. Corresponding to the CT abnormality, within the lateral inter/ lower pole right kidney, is a 13 mm lesion which is T2 hypo intense on image 18/series 3. T1 isointense prior to contrast. Demonstrates apparent mild post-contrast enhancement, including on image 52/series 11 and image 53/ series 102. No hydronephrosis. Stomach/Bowel: Normal stomach and abdominal bowel loops. Vascular/Lymphatic: Normal caliber of the aorta and branch vessels. Patent renal veins. No retroperitoneal or retrocrural adenopathy. Other: No ascites. Musculoskeletal: Mild convex left lumbar spine curvature. IMPRESSION: 1. 13 mm right renal lesion demonstrates T2 hypo intensity and mild post-contrast enhancement. 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 When comparing to multiple prior exams, this is subtly present at 10 mm on image 66/series 2 of the 06/20/2013 exam. This suggests this is an indolent lesion. Therefore, potential clinical strategy of follow-up with pre and post contrast CT or MRI at 6-12 months could be considered. 2. No evidence of metastatic disease or right renal vein involvement. Electronically Signed   By: Jeronimo Greaves M.D.   On: 06/02/2016 12:04   Ct Abdomen Pelvis W Contrast  Result Date: 06/01/2016 CLINICAL DATA:  Bilateral lower abdominal pain for 1 month.  Nausea. EXAM: CT ABDOMEN AND PELVIS WITH CONTRAST TECHNIQUE: Multidetector CT imaging of the abdomen and pelvis was performed using the standard protocol following bolus administration of intravenous contrast. CONTRAST:  ISOVUE-300 IOPAMIDOL (ISOVUE-300) INJECTION 61% COMPARISON:  May 13, 2016 FINDINGS: Lower chest: A tiny nodule in the right lower lung on series 4, image 5 is stable measuring 3 mm, unchanged. Lung bases are  otherwise unremarkable. There is gynecomastia. Hepatobiliary: Hepatic steatosis is identified. The patient is status post cholecystectomy. The portal vein is normal. Pancreas: No mass, inflammatory changes, or other significant abnormality. Spleen: Within normal limits in size and appearance. Adrenals/Urinary Tract: The adrenal glands and left kidney are normal. There is a nonspecific mass in the lateral right kidney measuring 12.6 mm which has slowly grown since January of 2015 when it measured 8.6 mm. The attenuation is greater than 50 Hounsfield units. The right kidney is otherwise normal. Stomach/Bowel: The stomach is normal. There does not appear to be a malrotation. However, most of the jejunum is in the right side of the abdomen. This is stable. The colon and appendix are normal. Vascular/Lymphatic: No pathologically enlarged lymph nodes. No evidence of abdominal aortic aneurysm. Reproductive: No mass or other significant abnormality. Other: There is skin thickening and increased attenuation in the subcutaneous fat, unchanged. Musculoskeletal: Again noted is ankylosis of the SI joints and syndesmophytes throughout the visualized spine. IMPRESSION: 1. **An incidental finding of potential clinical significance has been found.** There is a growing nodule in the right kidney. While this may represent a hyperdense cyst, it is indeterminate. Recommend an MRI for further characterization. 2. Increased attenuation in the subcutaneous fat and skin thickening suggests volume overload. 3. 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 Electronically Signed   By: Gerome Sam III M.D   On: 06/01/2016 20:53    EKG:   Orders placed or performed during the hospital encounter of 04/23/16  .  EKG 12-Lead  . EKG 12-Lead    ASSESSMENT AND PLAN:   40 year old male with past medical history significant for type 1 diabetes mellitus, noncompliance presents to  hospital secondary to abdominal pain nausea vomiting  #1 type 1 diabetes mellitus with hyperglycemia-non-acidotic, nonketotic. -IV fluids were given. Restarted on Lantus. SSI -A1C 16.5   #2 Hypokalemia- being replaced  #3 Hypogonadism- testosterone gel  #4 Right renal mass- MRI with confirmation of the 13mm mass- outpatient f/u in 6 months for repeat scan  #5 Peripheral neuropathy- gabapentin  #6 UTI- continue ceftin  #7 DVT Prophylaxis- lovenox    All the records are reviewed and case discussed with Care Management/Social Workerr. Management plans discussed with the patient, family and they are in agreement.  CODE STATUS: Full code  TOTAL TIME TAKING CARE OF THIS PATIENT: 37 minutes.   POSSIBLE D/C TOMORROW, DEPENDING ON CLINICAL CONDITION.   Jaylissa Felty M.D on 06/02/2016 at 3:22 PM  Between 7am to 6pm - Pager - [phone]  After 6pm go to [email or web address] - Social research officer, governmentpassword EPAS [email or web address]  [facility] [place] [facility]  Office  [phone]  CC: Primary care physician; Derwood KaplanEason,  Ernest B, MD

## 2016-06-03 LAB — BASIC METABOLIC PANEL
Anion gap: 3 — ABNORMAL LOW (ref 5–15)
BUN: 7 mg/dL (ref 6–20)
CALCIUM: 8.1 mg/dL — AB (ref 8.9–10.3)
CO2: 24 mmol/L (ref 22–32)
CREATININE: 0.51 mg/dL — AB (ref 0.61–1.24)
Chloride: 111 mmol/L (ref 101–111)
GFR calc Af Amer: >60 mL/min (ref >60)
GLUCOSE: 264 mg/dL — AB (ref 65–99)
Potassium: 4.6 mmol/L (ref 3.5–5.1)
SODIUM: 138 mmol/L (ref 135–145)

## 2016-06-03 LAB — GLUCOSE, CAPILLARY
GLUCOSE-CAPILLARY: 241 mg/dL — AB (ref 65–99)
Glucose-Capillary: 240 mg/dL — ABNORMAL HIGH (ref 65–99)

## 2016-06-03 LAB — MAGNESIUM: Magnesium: 1.4 mg/dL — ABNORMAL LOW (ref 1.7–2.4)

## 2016-06-03 MED ORDER — TRAMADOL HCL 50 MG PO TABS: 50.0000 mg | ORAL_TABLET | Freq: Four times a day (QID) | ORAL | 0 refills | Status: AC | PRN

## 2016-06-03 MED ORDER — INSULIN PEN NEEDLE 29G X 10MM MISC: 1.0000 | Freq: Four times a day (QID) | 0 refills | Status: AC

## 2016-06-03 MED ORDER — INSULIN GLARGINE 100 UNIT/ML SOLOSTAR PEN: 30.0000 [IU] | PEN_INJECTOR | Freq: Every day | SUBCUTANEOUS | 11 refills | Status: DC

## 2016-06-03 MED ORDER — DICYCLOMINE HCL 10 MG PO CAPS: 10.0000 mg | ORAL_CAPSULE | Freq: Three times a day (TID) | ORAL | 0 refills | Status: AC

## 2016-06-03 MED ORDER — INSULIN GLARGINE 100 UNIT/ML ~~LOC~~ SOLN
30.0000 [IU] | Freq: Every day | SUBCUTANEOUS | Status: DC
  Administered 2016-06-03: 30 [IU] via SUBCUTANEOUS
  Filled 2016-06-03: qty 0.3

## 2016-06-03 MED ORDER — INSULIN ASPART 100 UNIT/ML FLEXPEN: 5.0000 [IU] | PEN_INJECTOR | Freq: Three times a day (TID) | SUBCUTANEOUS | 3 refills | Status: AC

## 2016-06-03 MED ORDER — MAGNESIUM SULFATE 2 GM/50ML IV SOLN
2.0000 g | Freq: Once | INTRAVENOUS | Status: AC
  Administered 2016-06-03: 2 g via INTRAVENOUS
  Filled 2016-06-03: qty 50

## 2016-06-03 MED ORDER — BLOOD GLUCOSE MONITOR KIT: PACK | 0 refills | Status: AC

## 2016-06-03 MED ORDER — FREESTYLE LANCETS MISC: 12 refills | Status: AC

## 2016-06-03 MED ORDER — INSULIN GLARGINE 100 UNIT/ML SOLOSTAR PEN: 30.0000 [IU] | PEN_INJECTOR | Freq: Every day | SUBCUTANEOUS | 11 refills | Status: AC

## 2016-06-03 MED ORDER — TESTOSTERONE 50 MG/5GM (1%) TD GEL: 5.0000 g | Freq: Every day | TRANSDERMAL | 0 refills | Status: AC

## 2016-06-03 NOTE — Discharge Summary (Signed)
[place] at [place] NAME: Roberto [name]    MR#:  [id number]  DATE OF BIRTH:  May 18, 1976  DATE OF ADMISSION:  05/31/2016   ADMITTING PHYSICIAN: Lytle Butte, MD  DATE OF DISCHARGE: 06/03/2016 12:59 PM  PRIMARY CARE PHYSICIAN: Marden Noble, MD   ADMISSION DIAGNOSIS:   Generalized abdominal pain [R10.84] Hyperglycemia [R73.9]  DISCHARGE DIAGNOSIS:   Active Problems:   Hyperosmolar non-ketotic state in patient with type 2 diabetes mellitus ([place])   SECONDARY DIAGNOSIS:   Past Medical History:  Diagnosis Date  . Crohn disease ([place])   . Diabetes mellitus   . Neuropathy ([place])   . PTSD (post-traumatic stress disorder)   . Sickle cell anemia [facility])     HOSPITAL COURSE:   40 year old male with past medical history significant for type 1 diabetes mellitus, noncompliance presents to hospital secondary to abdominal pain nausea vomiting  #1 type 1 diabetes mellitus with hyperglycemia-non-acidotic, nonketotic. -A1C 16.5 -Discharged on Lantus and NovoLog. Diabetes education given. Compliance to medications needed. -Low carbohydrate diet   #2 Hypokalemia- replaced  #3 Hypogonadism- testosterone gel  #4 Right renal mass- MRI with confirmation of the 20m mass- outpatient f/u With urology in 6 months for repeat scan Also has hypogonadism and needs testosterone gel  #5 Peripheral neuropathy- gabapentin  #6 UTI- Finished ceftin   Stable for discharge today  DISCHARGE CONDITIONS:   Stable CONSULTS OBTAINED:   none DRUG ALLERGIES:   Allergies  Allergen Reactions  . Cherry Shortness Of Breath and Swelling    Facial swelling/SOB   DISCHARGE MEDICATIONS:     Medication List    STOP taking these medications   cephALEXin 500 MG capsule Commonly known as:  KEFLEX   insulin glargine 100 UNIT/ML injection Commonly known as:  LANTUS Replaced by:  Insulin Glargine 100 UNIT/ML Solostar Pen   insulin NPH-regular  Human (70-30) 100 UNIT/ML injection Commonly known as:  NOVOLIN 70/30   metFORMIN 1000 MG tablet Commonly known as:  GLUCOPHAGE   oxyCODONE 5 MG immediate release tablet Commonly known as:  ROXICODONE   predniSONE 10 MG (21) Tbpk tablet Commonly known as:  STERAPRED UNI-PAK 21 TAB   predniSONE 20 MG tablet Commonly known as:  DELTASONE     TAKE these medications   blood glucose meter kit and supplies Kit Dispense based on patient and insurance preference. Use up to four times daily as directed. (FOR ICD-9 250.00, 250.01).   cefUROXime 500 MG tablet Commonly known as:  CEFTIN Take 1 tablet (500 mg total) by mouth 2 (two) times daily with a meal.   dicyclomine 10 MG capsule Commonly known as:  BENTYL Take 1 capsule (10 mg total) by mouth 4 (four) times daily -  before meals and at bedtime.   freestyle lancets Use as instructed   gabapentin 800 MG tablet Commonly known as:  NEURONTIN Take 800 mg by mouth 3 (three) times daily. What changed:  Another medication with the same name was removed. Continue taking this medication, and follow the directions you see here.   glucose blood test strip Commonly known as:  FREESTYLE LITE Use as instructed   insulin aspart 100 UNIT/ML injection Commonly known as:  novoLOG Inject into the skin 3 (three) times daily with meals. Inject subcutaneous 3 times a day with meal per sliding scale as directed for blood sugar: 0-149= 0 units 150-200= 2 units 201-250= 4 units 251-300= 6 units,  301-350= 8 units, 351-400= 10  units 401 or greater=come to emergency department What changed:  Another medication with the same name was added. Make sure you understand how and when to take each.   insulin aspart 100 UNIT/ML FlexPen Commonly known as:  NOVOLOG Inject 5 Units into the skin 3 (three) times daily with meals. What changed:  You were already taking a medication with the same name, and this prescription was added. Make sure you understand how and  when to take each.   Insulin Glargine 100 UNIT/ML Solostar Pen Commonly known as:  LANTUS Inject 30 Units into the skin daily at 10 pm. Replaces:  insulin glargine 100 UNIT/ML injection   Insulin Pen Needle 29G X Misc 1 each by Does not apply route 4 (four) times daily.   testosterone 50 MG/5GM (1%) Gel Commonly known as:  ANDROGEL Place 5 g onto the skin daily.   traMADol 50 MG tablet Commonly known as:  ULTRAM Take 1 tablet (50 mg total) by mouth every 6 (six) hours as needed for moderate pain or severe pain. What changed:  reasons to take this        DISCHARGE INSTRUCTIONS:   1. PCP follow-up in 1-2 weeks  DIET:   Diabetic diet  ACTIVITY:   Activity as tolerated  OXYGEN:   Home Oxygen: No.  Oxygen Delivery: room air  DISCHARGE LOCATION:   home   If you experience worsening of your admission symptoms, develop shortness of breath, life threatening emergency, suicidal or homicidal thoughts you must seek medical attention immediately by calling 911 or calling your MD immediately  if symptoms less severe.  You Must read complete instructions/literature along with all the possible adverse reactions/side effects for all the Medicines you take and that have been prescribed to you. Take any new Medicines after you have completely understood and accpet all the possible adverse reactions/side effects.   Please note  You were cared for by a hospitalist during your hospital stay. If you have any questions about your discharge medications or the care you received while you were in the hospital after you are discharged, you can call the unit and asked to speak with the hospitalist on call if the hospitalist that took care of you is not available. Once you are discharged, your primary care physician will handle any further medical issues. Please note that NO REFILLS for any discharge medications will be authorized once you are discharged, as it is imperative that you return  to your primary care physician (or establish a relationship with a primary care physician if you do not have one) for your aftercare needs so that they can reassess your need for medications and monitor your lab values.    On the day of Discharge:  VITAL SIGNS:   Blood pressure (!) 144/96, pulse (!) 57, temperature 98.2 F (36.8 C), temperature source Oral, resp. rate 16, height 5\' 10"  (1.778 m), weight 79.4 kg (175 lb), SpO2 99 %.  PHYSICAL EXAMINATION:   GENERAL:  40 y.o.-year-old patient lying in the bed with no acute distress.  EYES: Pupils equal, round, reactive to light and accommodation. No scleral icterus. Extraocular muscles intact.  HEENT: Head atraumatic, normocephalic. Oropharynx and nasopharynx clear.  NECK:  Supple, no jugular venous distention. No thyroid enlargement, no tenderness.  LUNGS: Normal breath sounds bilaterally, no wheezing, rales,rhonchi or crepitation. No use of accessory muscles of respiration.  CARDIOVASCULAR: S1, S2 normal. No murmurs, rubs, or gallops.  ABDOMEN: Soft, nontender, Mild discomfort on palpation in periumbilical region,  nondistended. Bowel sounds present. No organomegaly or mass.  EXTREMITIES: No pedal edema, cyanosis, or clubbing.  NEUROLOGIC: Cranial nerves II through XII are intact. Muscle strength 5/5 in all extremities. Sensation intact. Gait not checked.  PSYCHIATRIC: The patient is alert and oriented x 3.  SKIN: No obvious rash, lesion, or ulcer.   DATA REVIEW:   CBC  Recent Labs Lab 06/01/16 0456  WBC 8.0  HGB 12.5*  HCT 36.0*  PLT 98*    Chemistries   Recent Labs Lab 05/31/16 1319  06/03/16 0652  NA 132*  < > 138  K 2.8*  < > 4.6  CL 94*  < > 111  CO2 24  < > 24  GLUCOSE 707*  < > 264*  BUN 8  < > 7  CREATININE 0.83  < > 0.51*  CALCIUM 9.0  < > 8.1*  MG  --   < > 1.4*  AST 15  --   --   ALT 17  --   --   ALKPHOS 101  --   --   BILITOT 1.1  --   --   < > = values in this interval not  displayed.   Microbiology Results  Results for orders placed or performed during the hospital encounter of 05/31/16  MRSA PCR Screening     Status: None   Collection Time: 05/31/16  6:45 PM  Result Value Ref Range Status   MRSA by PCR NEGATIVE NEGATIVE Final    Comment:        The GeneXpert MRSA Assay (FDA approved for NASAL specimens only), is one component of a comprehensive MRSA colonization surveillance program. It is not intended to diagnose MRSA infection nor to guide or monitor treatment for MRSA infections.     RADIOLOGY:  No results found.   Management plans discussed with the patient, family and they are in agreement.  CODE STATUS:     Code Status Orders        Start     Ordered   05/31/16 1609  Full code  Continuous     05/31/16 1609    Code Status History    Date Active Date Inactive Code Status Order ID Comments User Context   04/04/2012  1:25 AM 04/04/2012 12:49 PM Full Code [id number]  Joaquim Lai C. [place], [place] ED   04/04/2012  1:23 AM 04/04/2012  1:25 AM Full Code [id number]  Idalia Needle. [place], [place] ED   03/20/2012  2:43 PM 03/20/2012  9:42 PM Full Code [id number]  Verl Dicker, PA-C ED   01/17/2012 11:32 AM 01/17/2012 11:14 PM Full Code [id number]  Renold Genta, PA ED      TOTAL TIME TAKING CARE OF THIS PATIENT: 37 minutes.    Tristain Daily M.D on 06/03/2016 at 1:34 PM  Between 7am to 6pm - Pager - [phone]  After 6pm go to [email or web address] - Proofreader  [facility] [place] [facility]  Office  [phone]  CC: Primary care physician; Marden Noble, MD   Note: This dictation was prepared with Dragon dictation along with smaller phrase technology. Any transcriptional errors that result from this process are unintentional.

## 2016-06-03 NOTE — Progress Notes (Signed)
Pt d/c home; d/c instructions reviewed w/ pt; pt understanding was verbalized; IVs removed catheters in tact, gauze dressings applied; all pt questions answered; pt left unit via wheelchair accompanied by staff 

## 2016-06-03 NOTE — Discharge Instructions (Signed)
Follow all MD discharge instructions. Take all medications as prescribed. Keep all follow up appointments. If your symptoms return, call your doctor. If you experience any new symptoms that are of concern to you or that are bothersome to you, call your doctor. For all questions and/or concerns, call your doctor. ° ° If you have a medical emergency, call 911 ° ° ° °

## 2016-06-03 NOTE — Consult Note (Signed)
MEDICATION RELATED CONSULT NOTE -follow up  Pharmacy Consult for electrolytes Indication: hypokalemia  Allergies  Allergen Reactions  . Cherry Shortness Of Breath and Swelling    Facial swelling/SOB    Patient Measurements: Height: 5\' 10"  (177.8 cm) Weight: 175 lb (79.4 kg) IBW/kg (Calculated) : 73 Adjusted Body Weight:   Vital Signs: Temp: 98.2 F (36.8 C) (08/26 0536) Temp Source: Oral (08/26 0536) BP: 144/96 (08/26 0557) Pulse Rate: 57 (08/26 0536) Intake/Output from previous day: 08/25 0701 - 08/26 0700 In: 4722.7 [P.O.:1620; I.V.:3102.7] Out: 1800 [Urine:1800] Intake/Output from this shift: Total I/O In: -  Out: 825 [Urine:825]  Labs:  Recent Labs  05/31/16 1319 06/01/16 0456 06/02/16 0430 06/03/16 0652  WBC 11.5* 8.0  --   --   HGB 14.5 12.5*  --   --   HCT 43.0 36.0*  --   --   PLT 121* 98*  --   --   CREATININE 0.83 1.06 0.81 0.51*  MG  --  1.7  --  1.4*  ALBUMIN 3.8  --   --   --   PROT 7.1  --   --   --   AST 15  --   --   --   ALT 17  --   --   --   ALKPHOS 101  --   --   --   BILITOT 1.1  --   --   --    Estimated Creatinine Clearance: 129.3 mL/min (by C-G formula based on SCr of 0.8 mg/dL).   Microbiology: Recent Results (from the past 720 hour(s))  MRSA PCR Screening     Status: None   Collection Time: 05/31/16  6:45 PM  Result Value Ref Range Status   MRSA by PCR NEGATIVE NEGATIVE Final    Comment:        The GeneXpert MRSA Assay (FDA approved for NASAL specimens only), is one component of a comprehensive MRSA colonization surveillance program. It is not intended to diagnose MRSA infection nor to guide or monitor treatment for MRSA infections.     Medical History: Past Medical History:  Diagnosis Date  . Crohn disease ([facility])   . Diabetes mellitus   . Neuropathy ([facility])   . PTSD (post-traumatic stress disorder)   . Sickle cell anemia ([facility])     Medications:  Scheduled:  . cefUROXime  500 mg Oral BID WC  . dicyclomine   10 mg Oral TID AC & HS  . enoxaparin (LOVENOX) injection  40 mg Subcutaneous Q24H  . gabapentin  800 mg Oral TID  . insulin aspart  0-20 Units Subcutaneous TID WC  . insulin aspart  0-5 Units Subcutaneous QHS  . insulin glargine  30 Units Subcutaneous Daily  . magnesium sulfate 1 - 4 g bolus IVPB  2 g Intravenous Once  . testosterone  5 g Transdermal Daily    Assessment: Pt is a 40 year old male admitted with hyperglycemia and abdominal pain.   Goal of Therapy:  K=3.5-5 Mg= 1.7-2.4  Plan:  8/26  K= 4.6,  Mag= 1.4    Pt still on IV fluids: NS w/91meq KCL at 150 ml/hr. MD has ordered Magnesium 2 gram IV x 1 today 8/26 F/u am labs.   Bari Mantis PharmD Clinical Pharmacist 06/03/2016
